# Patient Record
Sex: Male | Born: 1955 | Race: Black or African American | Hispanic: No | Smoking: Current every day smoker
Health system: Southern US, Community
[De-identification: ages and names within clinical notes are randomized; demographics above are authoritative.]

## PROBLEM LIST (undated history)

## (undated) DIAGNOSIS — I10 Essential (primary) hypertension: Secondary | ICD-10-CM

## (undated) DIAGNOSIS — G9050 Complex regional pain syndrome I, unspecified: Secondary | ICD-10-CM

## (undated) DIAGNOSIS — F32A Depression, unspecified: Secondary | ICD-10-CM

## (undated) DIAGNOSIS — F329 Major depressive disorder, single episode, unspecified: Secondary | ICD-10-CM

## (undated) DIAGNOSIS — F1010 Alcohol abuse, uncomplicated: Secondary | ICD-10-CM

## (undated) HISTORY — PX: EYE SURGERY: SHX253

## (undated) HISTORY — PX: HERNIA REPAIR: SHX51

## (undated) HISTORY — PX: OTHER SURGICAL HISTORY: SHX169

---

## 2002-03-21 DIAGNOSIS — I10 Essential (primary) hypertension: Secondary | ICD-10-CM | POA: Insufficient documentation

## 2010-05-02 DIAGNOSIS — G9050 Complex regional pain syndrome I, unspecified: Secondary | ICD-10-CM | POA: Insufficient documentation

## 2011-11-28 ENCOUNTER — Emergency Department (HOSPITAL_COMMUNITY)
Admission: EM | Admit: 2011-11-28 | Discharge: 2011-11-29 | Disposition: A | Discharge status: Home / Self Care | Payer: Medicare (Managed Care) | Attending: Emergency Medicine | Admitting: Emergency Medicine

## 2011-11-28 ENCOUNTER — Encounter (HOSPITAL_COMMUNITY): Payer: Self-pay | Admitting: *Deleted

## 2011-11-28 DIAGNOSIS — I10 Essential (primary) hypertension: Secondary | ICD-10-CM | POA: Insufficient documentation

## 2011-11-28 DIAGNOSIS — F1010 Alcohol abuse, uncomplicated: Secondary | ICD-10-CM

## 2011-11-28 DIAGNOSIS — F1410 Cocaine abuse, uncomplicated: Secondary | ICD-10-CM | POA: Insufficient documentation

## 2011-11-28 DIAGNOSIS — E876 Hypokalemia: Secondary | ICD-10-CM

## 2011-11-28 HISTORY — DX: Essential (primary) hypertension: I10

## 2011-11-28 LAB — CBC
HCT: 45.1 % (ref 39.0–52.0)
Hemoglobin: 16.3 g/dL (ref 13.0–17.0)
MCV: 92.4 fL (ref 78.0–100.0)
Platelets: 230 10*3/uL (ref 150–400)
RDW: 13.5 % (ref 11.5–15.5)
WBC: 9.4 10*3/uL (ref 4.0–10.5)

## 2011-11-28 LAB — COMPREHENSIVE METABOLIC PANEL
ALT: 7 U/L (ref 0–53)
AST: 11 U/L (ref 0–37)
Albumin: 4 g/dL (ref 3.5–5.2)
CO2: 25 mEq/L (ref 19–32)
Calcium: 8.9 mg/dL (ref 8.4–10.5)
Sodium: 140 mEq/L (ref 135–145)
Total Protein: 7.7 g/dL (ref 6.0–8.3)

## 2011-11-28 LAB — RAPID URINE DRUG SCREEN, HOSP PERFORMED
Amphetamines: NOT DETECTED
Barbiturates: NOT DETECTED
Benzodiazepines: NOT DETECTED
Cocaine: POSITIVE — AB
Opiates: NOT DETECTED
Tetrahydrocannabinol: NOT DETECTED

## 2011-11-28 MED ORDER — ZOLPIDEM TARTRATE 5 MG PO TABS: 5.0000 mg | ORAL_TABLET | Freq: Every evening | ORAL | Status: DC | PRN

## 2011-11-28 MED ORDER — ONDANSETRON HCL 8 MG PO TABS: 4.0000 mg | ORAL_TABLET | Freq: Three times a day (TID) | ORAL | Status: DC | PRN

## 2011-11-28 MED ORDER — BUPROPION HCL 75 MG PO TABS: 75.0000 mg | ORAL_TABLET | Freq: Two times a day (BID) | ORAL | Status: DC

## 2011-11-28 MED ORDER — LISINOPRIL 20 MG PO TABS
20.0000 mg | ORAL_TABLET | Freq: Every day | ORAL | Status: DC
  Administered 2011-11-28: 20 mg via ORAL
  Filled 2011-11-28: qty 1

## 2011-11-28 MED ORDER — POTASSIUM CHLORIDE CRYS ER 20 MEQ PO TBCR
40.0000 meq | EXTENDED_RELEASE_TABLET | Freq: Once | ORAL | Status: AC
  Administered 2011-11-28: 40 meq via ORAL
  Filled 2011-11-28: qty 2

## 2011-11-28 MED ORDER — ARIPIPRAZOLE 10 MG PO TABS
10.0000 mg | ORAL_TABLET | Freq: Every day | ORAL | Status: DC
  Administered 2011-11-28: 10 mg via ORAL
  Filled 2011-11-28 (×2): qty 1

## 2011-11-28 MED ORDER — NICOTINE 14 MG/24HR TD PT24
14.0000 mg | MEDICATED_PATCH | Freq: Every day | TRANSDERMAL | Status: DC
  Administered 2011-11-28: 14 mg via TRANSDERMAL
  Filled 2011-11-28 (×2): qty 1

## 2011-11-28 MED ORDER — ALUM & MAG HYDROXIDE-SIMETH 200-200-20 MG/5ML PO SUSP: 30.0000 mL | ORAL | Status: DC | PRN

## 2011-11-28 MED ORDER — LORAZEPAM 1 MG PO TABS: 1.0000 mg | ORAL_TABLET | Freq: Three times a day (TID) | ORAL | Status: DC | PRN

## 2011-11-28 MED ORDER — ACETAMINOPHEN 325 MG PO TABS: 650.0000 mg | ORAL_TABLET | ORAL | Status: DC | PRN

## 2011-11-28 MED ORDER — IBUPROFEN 400 MG PO TABS: 600.0000 mg | ORAL_TABLET | Freq: Three times a day (TID) | ORAL | Status: DC | PRN

## 2011-11-28 NOTE — ED Notes (Signed)
Patient was taken for shower.  Will return to RM 10 while trying to find placement.

## 2011-11-28 NOTE — ED Notes (Signed)
Patient currently resting quietly in bed; no respiratory or acute distress noted.  Patient denies any needs at this time; will continue to monitor. 

## 2011-11-28 NOTE — ED Provider Notes (Signed)
History  This chart was scribed for Dione Booze, MD by Bennett Scrape. This patient was seen in room TR10C/TR10C and the patient's care was started at 3:01PM.  CSN: [id number]  Arrival date & time 11/28/11  1339   First MD Initiated Contact with Patient 11/28/11 1501      Chief Complaint  Patient presents with  . Medical Clearance    The history is provided by the patient. No language interpreter was used.    Timothy Mcmillan is a 56 y.o. male who presents to the Emergency Department requesting EtOH detox, because he is ready to seek help. He denies having other reasons currently. He reports that he consumes 3 to 4 40 oz alcoholic drinks daily and smokes crack when he can afford it. He states that the last time that he consumed alcohol was 2 to 3 hours ago and the last time that he smoked crack was 4 to 5 hours ago. He denies any other illegal drug use. He reports that he has been through detox before but denies having EtOH withdrawal seizures. He states that he has been mildly depressed but denies SI or HI. He denies having hallucinations, CP or abdominal pain as associated symptoms. He has a h/o HTN.    Past Medical History  Diagnosis Date  . Hypertension     History reviewed. No pertinent past surgical history.  History reviewed. No pertinent family history.  History  Substance Use Topics  . Smoking status: Not on file  . Smokeless tobacco: Not on file  . Alcohol Use: Yes      Review of Systems  Cardiovascular: Negative for chest pain.  Gastrointestinal: Negative for abdominal distention.  Neurological: Negative for seizures.  Psychiatric/Behavioral: Negative for suicidal ideas and hallucinations.  All other systems reviewed and are negative.    Allergies  Review of patient's allergies indicates no known allergies.  Home Medications   Current Outpatient Rx  Name Route Sig Dispense Refill  . ABILIFY PO Oral Take 1 tablet by mouth daily.    . WELLBUTRIN  PO Oral Take by mouth.    Marland Kitchen LISINOPRIL PO Oral Take 1 tablet by mouth daily.      Triage Vitals: BP 141/88  Pulse 70  Temp 98.4 F (36.9 C) (Oral)  Resp 20  SpO2 95%  Physical Exam  Nursing note and vitals reviewed. Constitutional: He is oriented to person, place, and time. He appears well-developed and well-nourished. No distress.  HENT:  Head: Normocephalic and atraumatic.  Eyes: Conjunctivae normal and EOM are normal.  Neck: Neck supple. No tracheal deviation present.  Cardiovascular: Normal rate and regular rhythm.   Pulmonary/Chest: Effort normal and breath sounds normal. No respiratory distress.  Abdominal: Soft. There is no tenderness.  Musculoskeletal: Normal range of motion.  Neurological: He is alert and oriented to person, place, and time.  Skin: Skin is warm and dry.  Psychiatric: He has a normal mood and affect. His behavior is normal.    ED Course  Procedures (including critical care time)  DIAGNOSTIC STUDIES: Oxygen Saturation is 95% on room air, normal by my interpretation.    COORDINATION OF CARE: 3:07PM-Discussed treatment plan which includes an evaluation by ACT with pt at bedside and pt agreed to plan.  3:17PM-Consulted with ACT.  Results for orders placed during the hospital encounter of 11/28/11  CBC      Component Value Range   WBC 9.4  4.0 - 10.5 K/uL   RBC 4.88  4.22 - 5.81 MIL/uL  Hemoglobin 16.3  13.0 - 17.0 g/dL   HCT 16.1  09.6 - 04.5 %   MCV 92.4  78.0 - 100.0 fL   MCH 33.4  26.0 - 34.0 pg   MCHC 36.1 (*) 30.0 - 36.0 g/dL   RDW 40.9  81.1 - 91.4 %   Platelets 230  150 - 400 K/uL  COMPREHENSIVE METABOLIC PANEL      Component Value Range   Sodium 140  135 - 145 mEq/L   Potassium 3.4 (*) 3.5 - 5.1 mEq/L   Chloride 106  96 - 112 mEq/L   CO2 25  19 - 32 mEq/L   Glucose, Bld 95  70 - 99 mg/dL   BUN 14  6 - 23 mg/dL   Creatinine, Ser 7.82  0.50 - 1.35 mg/dL   Calcium 8.9  8.4 - 95.6 mg/dL   Total Protein 7.7  6.0 - 8.3 g/dL    Albumin 4.0  3.5 - 5.2 g/dL   AST 11  0 - 37 U/L   ALT 7  0 - 53 U/L   Alkaline Phosphatase 70  39 - 117 U/L   Total Bilirubin 0.3  0.3 - 1.2 mg/dL   GFR calc non Af Amer 74 (*) >90 mL/min   GFR calc Af Amer 85 (*) >90 mL/min  ETHANOL      Component Value Range   Alcohol, Ethyl (B) <11  0 - 11 mg/dL  URINE RAPID DRUG SCREEN (HOSP PERFORMED)      Component Value Range   Opiates NONE DETECTED  NONE DETECTED   Cocaine POSITIVE (*) NONE DETECTED   Benzodiazepines NONE DETECTED  NONE DETECTED   Amphetamines NONE DETECTED  NONE DETECTED   Tetrahydrocannabinol NONE DETECTED  NONE DETECTED   Barbiturates NONE DETECTED  NONE DETECTED    1. Alcohol abuse   2. Cocaine abuse   3. Hypokalemia       MDM  Alcohol and cocoaine abuse. ACT Team with be consulted to assist in placement.  Potassium has come back slightly low, and he is given a dose of oral potassium. ACT Team states he does not meet criteria for inpatient detox. He is referred for outpatient treatment.   I personally performed the services described in this documentation, which was scribed in my presence. The recorded information has been reviewed and considered.         Dione Booze, MD 11/28/11 [phone]

## 2011-11-28 NOTE — ED Notes (Addendum)
Pt was given list of homeless shelters in the surrounding area and assisted in calling the shelters. Social worker called to ask for some assistance, she gave a few shelters that she thought would be open at this time and day.

## 2011-11-28 NOTE — ED Notes (Signed)
Requesting detox from ETOH and crack.  Last use 1 hour PTA

## 2011-11-28 NOTE — ED Notes (Signed)
Pt reports he was recently kicked out of his homeless shelter. Pt requesting information on homeless shelter in [place].

## 2011-11-28 NOTE — BH Assessment (Signed)
Assessment Note   Timothy Mcmillan is an 56 y.o. male. Pt requests detox, reports he is drinking 3 40 oz beers daily and using crack cocaine.  Pt does not report withdrawal symptoms and vitals are not elevated.  BAC <11.  Pt reports he is homeless and somewhat depressed but does not endorse depressive symptoms except tearfulness.  Pt denies SI/HI/AV.  Pt very drowsy throughout.  Axis I: Alcohol Abuse Axis II: Deferred Axis III:  Past Medical History  Diagnosis Date  . Hypertension    Axis IV: housing problems Axis V: 51-60 moderate symptoms  Past Medical History:  Past Medical History  Diagnosis Date  . Hypertension     History reviewed. No pertinent past surgical history.  Family History: History reviewed. No pertinent family history.  Social History:  does not have a smoking history on file. He does not have any smokeless tobacco history on file. He reports that he drinks alcohol. He reports that he uses illicit drugs.  Additional Social History:  Alcohol / Drug Use Pain Medications: Pt denies Prescriptions: Pt denies Over the Counter: Pt denies History of alcohol / drug use?: Yes Negative Consequences of Use: Financial;Legal;Personal relationships;Work / Mining engineer #1 Name of Substance 1: alcohol 1 - Age of First Use: 20s 1 - Amount (size/oz): 3 40 oz beers 1 - Frequency: daily 1 - Duration: 1 month 1 - Last Use / Amount: 10/19, 2 quarts beer Substance #2 Name of Substance 2: crack 2 - Age of First Use: 45s 2 - Amount (size/oz): $40 2 - Frequency: daily 2 - Duration: 2 months 2 - Last Use / Amount: 10/19 $40  CIWA: CIWA-Ar BP: 130/79 mmHg Pulse Rate: 66  Nausea and Vomiting: no nausea and no vomiting Tactile Disturbances: moderate itching, pins and needles, burning or numbness Tremor: no tremor Auditory Disturbances: not present Paroxysmal Sweats: barely perceptible sweating, palms moist Visual Disturbances: not present Anxiety: no anxiety, at  ease Headache, Fullness in Head: none present Agitation: normal activity Orientation and Clouding of Sensorium: oriented and can do serial additions CIWA-Ar Total: 4  COWS:    Allergies: No Known Allergies  Home Medications:  (Not in a hospital admission)  OB/GYN Status:  No LMP for male patient.  General Assessment Data Location of Assessment: [facility] ED ACT Assessment: Yes Living Arrangements: Other (Comment) (homeless) Can pt return to current living arrangement?: Yes     Risk to self Suicidal Ideation: No Suicidal Intent: No Is patient at risk for suicide?: No Suicidal Plan?: No Access to Means: No What has been your use of drugs/alcohol within the last 12 months?: current use of alcohol, crack Previous Attempts/Gestures: Yes How many times?: 3  Triggers for Past Attempts: Other (Comment) (depression) Intentional Self Injurious Behavior: Cutting Comment - Self Injurious Behavior: in past Family Suicide History: No Recent stressful life event(s): Other (Comment);Conflict (Comment) (homless, relationships) Persecutory voices/beliefs?: No Depression: Yes Depression Symptoms: Tearfulness Substance abuse history and/or treatment for substance abuse?: Yes Suicide prevention information given to non-admitted patients: Yes  Risk to Others Homicidal Ideation: No Thoughts of Harm to Others: No Current Homicidal Intent: No Current Homicidal Plan: No Access to Homicidal Means: No History of harm to others?: No Assessment of Violence: None Noted Does patient have access to weapons?: No Criminal Charges Pending?: No Does patient have a court date: No  Psychosis Hallucinations: None noted Delusions: None noted  Mental Status Report Appear/Hygiene: Disheveled;Body odor Eye Contact: Poor Motor Activity: Unremarkable Speech: Logical/coherent Level of Consciousness: Drowsy  Mood: Other (Comment) (cooperative) Affect: Appropriate to circumstance Anxiety Level:  None Thought Processes: Relevant Judgement: Unimpaired Orientation: Person;Place;Time;Situation Obsessive Compulsive Thoughts/Behaviors: None  Cognitive Functioning Concentration: Normal Memory: Recent Intact;Remote Intact IQ: Average Insight: Fair Impulse Control: Poor Appetite: Good Weight Loss: 0  Weight Gain: 0  Sleep: No Change Total Hours of Sleep: 5  Vegetative Symptoms: None  ADLScreening [facility]) Patient's cognitive ability adequate to safely complete daily activities?: Yes Patient able to express need for assistance with ADLs?: Yes Independently performs ADLs?: Yes (appropriate for developmental age)  Abuse/Neglect [facility]) Physical Abuse: Denies Verbal Abuse: Denies Sexual Abuse: Denies  Prior Inpatient Therapy Prior Inpatient Therapy: Yes Prior Therapy Dates: 2010 Prior Therapy Facilty/Provider(s): [facility] Reason for Treatment: alcohol  Prior Outpatient Therapy Prior Outpatient Therapy: No  ADL Screening (condition at time of admission) Patient's cognitive ability adequate to safely complete daily activities?: Yes Patient able to express need for assistance with ADLs?: Yes Independently performs ADLs?: Yes (appropriate for developmental age) Weakness of Legs: None Weakness of Arms/Hands: None  Home Assistive Devices/Equipment Home Assistive Devices/Equipment: None    Abuse/Neglect Assessment (Assessment to be complete while patient is alone) Physical Abuse: Denies Verbal Abuse: Denies Sexual Abuse: Denies Exploitation of patient/patient's resources: Denies Self-Neglect: Denies Values / Beliefs Cultural Requests During Hospitalization: None Spiritual Requests During Hospitalization: None   Advance Directives (For Healthcare) Advance Directive: Patient does not have advance directive;Patient would not like information    Additional Information 1:1 In Past 12 Months?: No CIRT Risk: No Elopement Risk: No Does  patient have medical clearance?: Yes     Disposition: Discussed pt with Dr Preston Fleeting.  Pt does not report withdrawals or have elevated vital signs, does not meet criteria for detox.  Discussed with pt treatment options, outpt and inpt, gave contact info.  Pt also states he is homeless but says he was kicked out of [facility].  RN will get info for [facility] shelter as pt reports he may be able to get ride to [employer]. Disposition Disposition of Patient: Other dispositions Other disposition(s): Referred to outside facility (substance abuse providers)  On Site Evaluation by:   Reviewed with Physician:     Lorri Frederick 11/28/2011 5:52 PM

## 2011-11-28 NOTE — ED Notes (Signed)
Warm blankets given to patient-resting at present

## 2011-11-28 NOTE — ED Notes (Signed)
Pt reports he is here to detox from ETOH and crack. Pt reports he drinks about 6 40oz beers a day. Pt reports last drink was today and he drank 2 40 oz. beers. Pt reports he wasn't under the impression that he was going to have to stay here and he drove his friends car but needs to take it back so he won't be able to stay here. Pt denies SI & HI.

## 2011-11-28 NOTE — ED Notes (Signed)
Pt unable to find a shelter to take him for the night.

## 2011-11-28 NOTE — ED Notes (Signed)
Pt placed in blue scrubs. Security paged to wand pt.

## 2011-11-29 NOTE — ED Notes (Signed)
Received bedside report from [place], [place].  Patient currently asleep in bed; no respiratory or acute distress noted.  Patient sleeping in ED until morning.  Patient has already received discharge paperwork and signed disposition while in fast track.  Waiting until 0700 for official discharge, per charge RN. Will continue to monitor.

## 2011-11-29 NOTE — ED Notes (Addendum)
Patient woken up and escorted out to discharge desk by charge RN.

## 2011-11-29 NOTE — ED Notes (Signed)
Patient currently asleep in bed; no respiratory or acute distress.  Will continue to monitor.

## 2011-11-29 NOTE — ED Notes (Signed)
Patient resting quietly in bed; no respiratory or acute distress.  Respirations even and unlabored; patient lightly snoring.  Will continue to monitor.

## 2011-11-29 NOTE — ED Notes (Signed)
Patient asleep will continue to monitor.

## 2014-11-14 ENCOUNTER — Inpatient Hospital Stay (HOSPITAL_COMMUNITY)
Admission: EM | Admit: 2014-11-14 | Discharge: 2014-11-17 | DRG: 193 | Disposition: A | Discharge status: Home / Self Care | Payer: Medicare Other | Attending: Internal Medicine | Admitting: Internal Medicine

## 2014-11-14 ENCOUNTER — Encounter (HOSPITAL_COMMUNITY): Payer: Self-pay | Admitting: *Deleted

## 2014-11-14 ENCOUNTER — Inpatient Hospital Stay (HOSPITAL_COMMUNITY): Payer: Medicare Other

## 2014-11-14 ENCOUNTER — Emergency Department (HOSPITAL_COMMUNITY): Payer: Medicare Other

## 2014-11-14 DIAGNOSIS — I10 Essential (primary) hypertension: Secondary | ICD-10-CM | POA: Diagnosis present

## 2014-11-14 DIAGNOSIS — F17200 Nicotine dependence, unspecified, uncomplicated: Secondary | ICD-10-CM | POA: Diagnosis present

## 2014-11-14 DIAGNOSIS — F10230 Alcohol dependence with withdrawal, uncomplicated: Secondary | ICD-10-CM

## 2014-11-14 DIAGNOSIS — N179 Acute kidney failure, unspecified: Secondary | ICD-10-CM | POA: Diagnosis present

## 2014-11-14 DIAGNOSIS — F1010 Alcohol abuse, uncomplicated: Secondary | ICD-10-CM | POA: Diagnosis present

## 2014-11-14 DIAGNOSIS — R0600 Dyspnea, unspecified: Secondary | ICD-10-CM | POA: Diagnosis not present

## 2014-11-14 DIAGNOSIS — G9341 Metabolic encephalopathy: Secondary | ICD-10-CM | POA: Diagnosis present

## 2014-11-14 DIAGNOSIS — G9340 Encephalopathy, unspecified: Secondary | ICD-10-CM | POA: Diagnosis not present

## 2014-11-14 DIAGNOSIS — J189 Pneumonia, unspecified organism: Secondary | ICD-10-CM | POA: Diagnosis present

## 2014-11-14 DIAGNOSIS — F10939 Alcohol use, unspecified with withdrawal, unspecified: Secondary | ICD-10-CM | POA: Diagnosis present

## 2014-11-14 DIAGNOSIS — F10239 Alcohol dependence with withdrawal, unspecified: Secondary | ICD-10-CM | POA: Diagnosis present

## 2014-11-14 DIAGNOSIS — Z79899 Other long term (current) drug therapy: Secondary | ICD-10-CM | POA: Diagnosis not present

## 2014-11-14 DIAGNOSIS — E876 Hypokalemia: Secondary | ICD-10-CM | POA: Diagnosis present

## 2014-11-14 DIAGNOSIS — F10231 Alcohol dependence with withdrawal delirium: Secondary | ICD-10-CM | POA: Diagnosis not present

## 2014-11-14 DIAGNOSIS — F1910 Other psychoactive substance abuse, uncomplicated: Secondary | ICD-10-CM | POA: Diagnosis present

## 2014-11-14 DIAGNOSIS — N400 Enlarged prostate without lower urinary tract symptoms: Secondary | ICD-10-CM | POA: Diagnosis present

## 2014-11-14 DIAGNOSIS — F1410 Cocaine abuse, uncomplicated: Secondary | ICD-10-CM | POA: Diagnosis present

## 2014-11-14 DIAGNOSIS — F10931 Alcohol use, unspecified with withdrawal delirium: Secondary | ICD-10-CM | POA: Diagnosis present

## 2014-11-14 DIAGNOSIS — Z590 Homelessness: Secondary | ICD-10-CM | POA: Diagnosis not present

## 2014-11-14 DIAGNOSIS — F10930 Alcohol use, unspecified with withdrawal, uncomplicated: Secondary | ICD-10-CM

## 2014-11-14 DIAGNOSIS — R5383 Other fatigue: Secondary | ICD-10-CM

## 2014-11-14 DIAGNOSIS — R930 Abnormal findings on diagnostic imaging of skull and head, not elsewhere classified: Secondary | ICD-10-CM

## 2014-11-14 LAB — RAPID URINE DRUG SCREEN, HOSP PERFORMED
AMPHETAMINES: NOT DETECTED
BENZODIAZEPINES: NOT DETECTED
Barbiturates: NOT DETECTED
COCAINE: POSITIVE — AB
Opiates: NOT DETECTED
Tetrahydrocannabinol: NOT DETECTED

## 2014-11-14 LAB — COMPREHENSIVE METABOLIC PANEL
ALBUMIN: 3.6 g/dL (ref 3.5–5.0)
ALT: 12 U/L — ABNORMAL LOW (ref 17–63)
ANION GAP: 10 (ref 5–15)
AST: 29 U/L (ref 15–41)
Alkaline Phosphatase: 77 U/L (ref 38–126)
BUN: 7 mg/dL (ref 6–20)
CHLORIDE: 101 mmol/L (ref 101–111)
CO2: 28 mmol/L (ref 22–32)
Calcium: 8.4 mg/dL — ABNORMAL LOW (ref 8.9–10.3)
Creatinine, Ser: 1.35 mg/dL — ABNORMAL HIGH (ref 0.61–1.24)
GFR calc Af Amer: >60 mL/min (ref >60)
GFR, EST NON AFRICAN AMERICAN: 56 mL/min — AB (ref >60)
Glucose, Bld: 136 mg/dL — ABNORMAL HIGH (ref 65–99)
POTASSIUM: 2.8 mmol/L — AB (ref 3.5–5.1)
Sodium: 139 mmol/L (ref 135–145)
Total Bilirubin: 0.5 mg/dL (ref 0.3–1.2)
Total Protein: 6.5 g/dL (ref 6.5–8.1)

## 2014-11-14 LAB — CBC WITH DIFFERENTIAL/PLATELET
BASOS ABS: 0 10*3/uL (ref 0.0–0.1)
BASOS PCT: 0 %
EOS PCT: 1 %
Eosinophils Absolute: 0.1 10*3/uL (ref 0.0–0.7)
HCT: 45.3 % (ref 39.0–52.0)
Hemoglobin: 15.4 g/dL (ref 13.0–17.0)
Lymphocytes Relative: 16 %
Lymphs Abs: 1.5 10*3/uL (ref 0.7–4.0)
MCH: 32.6 pg (ref 26.0–34.0)
MCHC: 34 g/dL (ref 30.0–36.0)
MCV: 96 fL (ref 78.0–100.0)
MONO ABS: 0.6 10*3/uL (ref 0.1–1.0)
Monocytes Relative: 7 %
Neutro Abs: 7.2 10*3/uL (ref 1.7–7.7)
Neutrophils Relative %: 76 %
PLATELETS: 229 10*3/uL (ref 150–400)
RBC: 4.72 MIL/uL (ref 4.22–5.81)
RDW: 14.2 % (ref 11.5–15.5)
WBC: 9.5 10*3/uL (ref 4.0–10.5)

## 2014-11-14 LAB — I-STAT VENOUS BLOOD GAS, ED
Bicarbonate: 24.7 mEq/L — ABNORMAL HIGH (ref 20.0–24.0)
O2 SAT: 91 %
TCO2: 26 mmol/L (ref 0–100)
pCO2, Ven: 40.9 mmHg — ABNORMAL LOW (ref 45.0–50.0)
pH, Ven: 7.388 — ABNORMAL HIGH (ref 7.250–7.300)
pO2, Ven: 61 mmHg — ABNORMAL HIGH (ref 30.0–45.0)

## 2014-11-14 LAB — ETHANOL

## 2014-11-14 LAB — LACTIC ACID, PLASMA: Lactic Acid, Venous: 1.3 mmol/L (ref 0.5–2.0)

## 2014-11-14 MED ORDER — NALOXONE HCL 0.4 MG/ML IJ SOLN
0.4000 mg | INTRAMUSCULAR | Status: DC | PRN
  Administered 2014-11-14: 0.4 mg via INTRAVENOUS
  Filled 2014-11-14: qty 1

## 2014-11-14 MED ORDER — LORAZEPAM 2 MG/ML IJ SOLN
1.0000 mg | Freq: Once | INTRAMUSCULAR | Status: AC
  Administered 2014-11-14: 1 mg via INTRAVENOUS
  Filled 2014-11-14: qty 1

## 2014-11-14 MED ORDER — LORAZEPAM 1 MG PO TABS: 0.0000 mg | ORAL_TABLET | Freq: Two times a day (BID) | ORAL | Status: DC

## 2014-11-14 MED ORDER — THIAMINE HCL 100 MG/ML IJ SOLN
100.0000 mg | Freq: Every day | INTRAMUSCULAR | Status: DC
  Administered 2014-11-15: 100 mg via INTRAVENOUS
  Filled 2014-11-14 (×4): qty 1

## 2014-11-14 MED ORDER — SODIUM CHLORIDE 0.9 % IV SOLN
INTRAVENOUS | Status: DC
  Administered 2014-11-14 – 2014-11-16 (×4): via INTRAVENOUS

## 2014-11-14 MED ORDER — DEXTROSE 5 % IV SOLN
1.0000 g | INTRAVENOUS | Status: DC
  Administered 2014-11-15 (×2): 1 g via INTRAVENOUS
  Filled 2014-11-14 (×3): qty 10

## 2014-11-14 MED ORDER — LORAZEPAM 2 MG/ML IJ SOLN: 0.0000 mg | Freq: Two times a day (BID) | INTRAMUSCULAR | Status: DC

## 2014-11-14 MED ORDER — VITAMIN B-1 100 MG PO TABS
100.0000 mg | ORAL_TABLET | Freq: Every day | ORAL | Status: DC
  Administered 2014-11-15 – 2014-11-17 (×3): 100 mg via ORAL
  Filled 2014-11-14 (×3): qty 1

## 2014-11-14 MED ORDER — LORAZEPAM 2 MG/ML IJ SOLN
2.0000 mg | INTRAMUSCULAR | Status: DC | PRN
Start: 2014-11-14 — End: 2014-11-17

## 2014-11-14 MED ORDER — DEXTROSE 5 % IV SOLN
500.0000 mg | INTRAVENOUS | Status: DC
  Administered 2014-11-15: 500 mg via INTRAVENOUS
  Filled 2014-11-14 (×2): qty 500

## 2014-11-14 MED ORDER — LORAZEPAM 1 MG PO TABS: 0.0000 mg | ORAL_TABLET | Freq: Four times a day (QID) | ORAL | Status: DC

## 2014-11-14 MED ORDER — LORAZEPAM 2 MG/ML IJ SOLN: 0.0000 mg | Freq: Four times a day (QID) | INTRAMUSCULAR | Status: DC

## 2014-11-14 MED ORDER — POTASSIUM CHLORIDE 10 MEQ/100ML IV SOLN
10.0000 meq | Freq: Once | INTRAVENOUS | Status: AC
  Administered 2014-11-14: 10 meq via INTRAVENOUS
  Filled 2014-11-14: qty 100

## 2014-11-14 MED ORDER — POTASSIUM CHLORIDE CRYS ER 20 MEQ PO TBCR
40.0000 meq | EXTENDED_RELEASE_TABLET | Freq: Once | ORAL | Status: AC
  Administered 2014-11-14: 40 meq via ORAL
  Filled 2014-11-14: qty 2

## 2014-11-14 MED ORDER — POTASSIUM CHLORIDE 20 MEQ/15ML (10%) PO SOLN
10.0000 meq | Freq: Once | ORAL | Status: DC
  Filled 2014-11-14: qty 15

## 2014-11-14 MED ORDER — DEXTROSE 5 % IV SOLN
500.0000 mg | Freq: Once | INTRAVENOUS | Status: AC
  Administered 2014-11-14: 500 mg via INTRAVENOUS
  Filled 2014-11-14: qty 500

## 2014-11-14 NOTE — ED Notes (Signed)
Pt is here with medical clearance [facility]  Pt wants to get off of crack and etoh.  Last crack use 2 days ago and last etoh was 2 days.  Usually does not go throught withdrawal of etoh.  Pt states he intermittently drinks.  No thoughts of SI.

## 2014-11-14 NOTE — ED Notes (Signed)
PT refuses to answer questions for CIWA score ,But opens eyes and jerks arm while NT attempting to draw Lab work.

## 2014-11-14 NOTE — ED Notes (Signed)
Pt acting erractic upon reassessment. Pt continues pulling wires and asking what things are. Pt acts confused, but denies hallucinations. Tiffany, PA informed.

## 2014-11-14 NOTE — H&P (Addendum)
PCP:  Default, Provider, MD    Referring provider Tiffany   Chief Complaint:  Here for medical clearance  HPI: Timothy Mcmillan is a 59 y.o. male   has a past medical history of Hypertension.   Presented with a she was interested in residential treatment for drug abuse. She presented to emergency department for medical clearance. Reports alcohol abuse as well as crack cocaine abuse last time 2 days ago Patient denies prior history of alcohol withdrawals. States he drinks on intermittently. Patient denied any suicidal ideation. Patient reports experiencing visual and auditory hallucinations. He uses crack cocaine. In emergency department he was found to have potassium down to 2.8 with EKG changes.. Chest x-ray was done was worrisome for multifocal pneumonia versus atelectasis. Hospitalist was called for admission for community-acquired pneumonia and hypokalemia in the setting of polysubstance abuse  Review of Systems:    Pertinent positives include:   Constitutional:  No weight loss, night sweats, Fevers, chills, fatigue, weight loss  HEENT:  No headaches, Difficulty swallowing,Tooth/dental problems,Sore throat,  No sneezing, itching, ear ache, nasal congestion, post nasal drip,  Cardio-vascular:  No chest pain, Orthopnea, PND, anasarca, dizziness, palpitations.no Bilateral lower extremity swelling  GI:  No heartburn, indigestion, abdominal pain, nausea, vomiting, diarrhea, change in bowel habits, loss of appetite, melena, blood in stool, hematemesis Resp:  no shortness of breath at rest. No dyspnea on exertion, No excess mucus, no productive cough, No non-productive cough, No coughing up of blood.No change in color of mucus.No wheezing. Skin:  no rash or lesions. No jaundice GU:  no dysuria, change in color of urine, no urgency or frequency. No straining to urinate.  No flank pain.  Musculoskeletal:  No joint pain or no joint swelling. No decreased range of motion. No  back pain.  Psych:  No change in mood or affect. No depression or anxiety. No memory loss.  Neuro: no localizing neurological complaints, no tingling, no weakness, no double vision, no gait abnormality, no slurred speech, no confusion  Otherwise ROS are negative except for above, 10 systems were reviewed  Past Medical History: Past Medical History  Diagnosis Date  . Hypertension    Past Surgical History  Procedure Laterality Date  . Eye surgery    . Hernia repair       Medications: Prior to Admission medications   Medication Sig Start Date End Date Taking? Authorizing Provider  ARIPiprazole (ABILIFY PO) Take 1 tablet by mouth daily.    Historical Provider, MD  BuPROPion HCl (WELLBUTRIN PO) Take by mouth.    Historical Provider, MD  LISINOPRIL PO Take 1 tablet by mouth daily.    Historical Provider, MD    Allergies:  No Known Allergies  Social History:  Ambulatory   independently  Lives at home  With family     reports that he has been smoking.  He does not have any smokeless tobacco history on file. He reports that he drinks alcohol. He reports that he uses illicit drugs (Cocaine).    Family History: family history includes Diabetes type II in his father; Stroke in his father.    Physical Exam: Patient Vitals for the past 24 hrs:  BP Temp Temp src Pulse Resp SpO2  11/14/14 1915 140/87 mmHg - - 60 - 96 %  11/14/14 1900 142/87 mmHg - - 63 - 94 %  11/14/14 1845 140/84 mmHg - - 63 - 96 %  11/14/14 1830 152/94 mmHg - - 62 - 96 %  11/14/14 1815 145/91  mmHg - - 64 - 97 %  11/14/14 1800 152/93 mmHg - - 64 - 99 %  11/14/14 1730 150/100 mmHg - - 67 - 100 %  11/14/14 1700 164/98 mmHg - - 63 21 100 %  11/14/14 1645 149/94 mmHg - - 65 18 95 %  11/14/14 1600 135/75 mmHg - - 62 18 97 %  11/14/14 1545 135/80 mmHg - - (!) 58 18 97 %  11/14/14 1530 139/67 mmHg - - 64 21 96 %  11/14/14 1521 133/77 mmHg - - 65 (!) 8 98 %  11/14/14 1246 157/89 mmHg 97.4 F (36.3 C) Oral 74 18  100 %    1. General:  in No Acute distress 2. Psychological: Somnolent difficult to arouse   Oriented to self 3. Head/ENT:    Dry Mucous Membranes                          Head Non traumatic, neck supple                          Normal   Dentition 4. SKIN:   decreased Skin turgor,  Skin clean Dry and intact no rash 5. Heart: Regular rate and rhythm no Murmur, Rub or gallop 6. Lungs: Clear to auscultation bilaterally, no wheezes or crackles   7. Abdomen: Soft, non-tender, Non distended 8. Lower extremities: no clubbing, cyanosis, or edema 9. Neurologically Grossly intact, moving all 4 extremities equally 10. MSK: Normal range of motion  body mass index is unknown because there is no height or weight on file.   Labs on Admission:   Results for orders placed or performed during the hospital encounter of 11/14/14 (from the past 24 hour(s))  CBC with Differential/Platelet     Status: None   Collection Time: 11/14/14  1:20 PM  Result Value Ref Range   WBC 9.5 4.0 - 10.5 K/uL   RBC 4.72 4.22 - 5.81 MIL/uL   Hemoglobin 15.4 13.0 - 17.0 g/dL   HCT 16.1 09.6 - 04.5 %   MCV 96.0 78.0 - 100.0 fL   MCH 32.6 26.0 - 34.0 pg   MCHC 34.0 30.0 - 36.0 g/dL   RDW 40.9 81.1 - 91.4 %   Platelets 229 150 - 400 K/uL   Neutrophils Relative % 76 %   Neutro Abs 7.2 1.7 - 7.7 K/uL   Lymphocytes Relative 16 %   Lymphs Abs 1.5 0.7 - 4.0 K/uL   Monocytes Relative 7 %   Monocytes Absolute 0.6 0.1 - 1.0 K/uL   Eosinophils Relative 1 %   Eosinophils Absolute 0.1 0.0 - 0.7 K/uL   Basophils Relative 0 %   Basophils Absolute 0.0 0.0 - 0.1 K/uL  Comprehensive metabolic panel     Status: Abnormal   Collection Time: 11/14/14  1:20 PM  Result Value Ref Range   Sodium 139 135 - 145 mmol/L   Potassium 2.8 (L) 3.5 - 5.1 mmol/L   Chloride 101 101 - 111 mmol/L   CO2 28 22 - 32 mmol/L   Glucose, Bld 136 (H) 65 - 99 mg/dL   BUN 7 6 - 20 mg/dL   Creatinine, Ser 7.82 (H) 0.61 - 1.24 mg/dL   Calcium 8.4 (L) 8.9 -  10.3 mg/dL   Total Protein 6.5 6.5 - 8.1 g/dL   Albumin 3.6 3.5 - 5.0 g/dL   AST 29 15 - 41 U/L   ALT 12 (  L) 17 - 63 U/L   Alkaline Phosphatase 77 38 - 126 U/L   Total Bilirubin 0.5 0.3 - 1.2 mg/dL   GFR calc non Af Amer 56 (L) >60 mL/min   GFR calc Af Amer >60 >60 mL/min   Anion gap 10 5 - 15  Ethanol     Status: None   Collection Time: 11/14/14  1:20 PM  Result Value Ref Range   Alcohol, Ethyl (B) <5 <5 mg/dL  Urine rapid drug screen (hosp performed)     Status: Abnormal   Collection Time: 11/14/14  6:04 PM  Result Value Ref Range   Opiates NONE DETECTED NONE DETECTED   Cocaine POSITIVE (A) NONE DETECTED   Benzodiazepines NONE DETECTED NONE DETECTED   Amphetamines NONE DETECTED NONE DETECTED   Tetrahydrocannabinol NONE DETECTED NONE DETECTED   Barbiturates NONE DETECTED NONE DETECTED    UA not obtained  No results found for: HGBA1C  CrCl cannot be calculated (Unknown ideal weight.).  BNP (last 3 results) No results for input(s): PROBNP in the last 8760 hours.  Other results:  I have pearsonaly reviewed this: ECG REPORT  Rate: 62  Rhythm: Normal sinus rhythm ST&T Change: No ischemic changes U wave present QTC 399  There were no vitals filed for this visit.   Cultures: No results found for: SDES, SPECREQUEST, CULT, REPTSTATUS   Radiological Exams on Admission: Dg Chest 2 View  11/14/2014   CLINICAL DATA:  Shortness of breath. Cough. History of hypertension and smoking.  EXAM: CHEST  2 VIEW  COMPARISON:  None.  FINDINGS: Borderline enlarged cardiac silhouette and mediastinal contours, potentially accentuated due decreased lung volumes and kyphotic projection. Bibasilar heterogeneous/consolidative opacities, left greater than right. There is minimal pleural parenchymal thickening about the peripheral aspect the right minor fissure. No definite pleural effusion or pneumothorax. No evidence of edema. No acute osseus abnormalities.  IMPRESSION: Bibasilar  heterogeneous/consolidative opacities, left greater than right, potentially atelectasis given mild hypoventilation though multifocal infection could have a similar appearance. A follow-up chest radiograph in 3 to 4 weeks after treatment is recommended to ensure resolution.   Electronically Signed   By: Simonne Come M.D.   On: 11/14/2014 16:31    Chart has been reviewed  Family not at  Bedside    Assessment/Plan  71 -year-old male with history of polysubstance abuse and alcohol abuse presents for medical clearance was found to be hypokalemic with potassium down to 2.8 and chest x-ray showing evidence of multifocal pneumonia   Present on Admission:  . Acute encephalopathy given significant lethargy we'll monitor and step down obtain CT scan of the head and the VBG . Hypokalemia will replace check magnesium level  . Polysubstance abuse we'll need social work consult  . Alcohol withdrawal delirium (HCC) - CIWA protocol given delirium and hallucinations will admit to step down  . CAP (community acquired pneumonia) we'll obtain blood cultures sputum culture check for HIV status for now cover with azithromycin and Rocephin patient will need follow-up imaging to document clearance   . Acute renal failure (ARF) (HCC) - we'll administer IV fluids check urine electrolytes    Prophylaxis: SCDs for now until CT scan is back  CODE STATUS:  FULL CODE as per patient    Disposition: was supposed to go to [facility]  Other plan as per orders.  I have spent a total of 55 min on this admission  Ikhlas Albo 11/14/2014, 7:40 PM  [facility]  Pager [phone]   after  2 AM please page floor coverage PA If 7AM-7PM, please contact the day team taking care of the patient  [email or web address]  Password [email or web address]

## 2014-11-14 NOTE — ED Notes (Signed)
PT continues to sleep but wakes to verbal stimuli.

## 2014-11-14 NOTE — ED Notes (Signed)
Changed patient acuity per Tran, PA-C 

## 2014-11-14 NOTE — ED Notes (Signed)
PT moved to POD E due to low  K+. PO replacement given.

## 2014-11-14 NOTE — ED Provider Notes (Signed)
CSN: [id number]     Arrival date & time 11/14/14  1234 History  By signing my name below, I, Lyndel Safe, attest that this documentation has been prepared under the direction and in the presence of Fayrene Helper, PA-C.  Electronically Signed: Lyndel Safe, ED Scribe. 11/14/2014. 1:17 PM.   Chief Complaint  Patient presents with  . Addiction Problem    The history is provided by the patient. No language interpreter was used.   HPI Comments: Timothy Mcmillan is a 59 y.o. male, with a PMhx of polysubstance abuse, who presents to the Emergency Department from [facility] for request of medical clearance. The pt reports he would like to detox from crack cocaine and EtOH due to experiencing increased paranoia with both auditory and visual hallucinations that are present with use of crack. Pt associates decreased appetite and sleep secondary to polysubstance abuse. Last use of both crack cocaine and EtOH was 2 days ago. He has sought treatment for abuse of these same substances in the past and notes he relapsed 4 months ago. Pt denies SI or HI or any pain.   Past Medical History  Diagnosis Date  . Hypertension    Past Surgical History  Procedure Laterality Date  . Eye surgery    . Hernia repair     No family history on file. Social History  Substance Use Topics  . Smoking status: Current Every Day Smoker  . Smokeless tobacco: None  . Alcohol Use: Yes     Comment: intermittently    Review of Systems  Constitutional: Positive for appetite change.  Psychiatric/Behavioral: Positive for hallucinations and sleep disturbance ( decreased sleep ). Negative for suicidal ideas and self-injury. The patient is nervous/anxious ( paranoia ).    Allergies  Review of patient's allergies indicates no known allergies.  Home Medications   Prior to Admission medications   Medication Sig Start Date End Date Taking? Authorizing Provider  ARIPiprazole (ABILIFY PO) Take 1 tablet by mouth  daily.    Historical Provider, MD  BuPROPion HCl (WELLBUTRIN PO) Take by mouth.    Historical Provider, MD  LISINOPRIL PO Take 1 tablet by mouth daily.    Historical Provider, MD   BP 157/89 mmHg  Pulse 74  Temp(Src) 97.4 F (36.3 C) (Oral)  Resp 18  SpO2 100% Physical Exam  Constitutional: He is oriented to person, place, and time. He appears well-developed and well-nourished. No distress.  HENT:  Head: Normocephalic.  Eyes: Conjunctivae are normal.  Neck: Normal range of motion. Neck supple.  Cardiovascular: Normal rate, regular rhythm and normal heart sounds.   Pulmonary/Chest: Effort normal and breath sounds normal. No respiratory distress.  Abdominal: Soft. Bowel sounds are normal. There is no tenderness.  Musculoskeletal: Normal range of motion.  Neurological: He is alert and oriented to person, place, and time. Coordination normal.  Skin: Skin is warm.  Psychiatric:  Speech delayed, easily distractible, requiring redirection, otherwise pt is cooperative, not suicidal or homicidal, pt does not appear paranoid, he has visual and auditory hallucinations, not eating much, not sleeping much.   Nursing note and vitals reviewed.   ED Course  Procedures  DIAGNOSTIC STUDIES: Oxygen Saturation is 100% on RA, normal by my interpretation.    COORDINATION OF CARE: 1:13 PM Discussed treatment plan with pt at bedside and pt agreed to plan. Will perform medical clearance. Pt does not exhibit any signs of withdrawal at this time.  2:36 PM Pt was found to be hypokalemic with a K+ level  of 2.8 and EKG was obtained which show evidence of U-wave and flattening T waves in the inferior lead and no prior EKG for comparison. When asked if pt has any CP, pt states he is having vague, sharp, mid sternal CP which has been intermittent for the past 3-4 days but he denies any associated light-headedness, dizziness,SOB or diaphoroesis. Given his risk factor for cardiac disease will we obtain a troponin and  consult with Dr. Littie Deeds for further evaluation.   2:52 PM Dr. Littie Deeds has seen EKG, and agrees that EKG changes likely reflect hypokalemia state.  Pt will be given Potassium supplementation upon discharge.    3:20 PM Care discussed with Marlon Pel, PA-C who will monitor pt, and obtain repeat K+ prior to d/c.  Pt currently stable.    Labs Review Labs Reviewed  COMPREHENSIVE METABOLIC PANEL - Abnormal; Notable for the following:    Potassium 2.8 (*)    Glucose, Bld 136 (*)    Creatinine, Ser 1.35 (*)    Calcium 8.4 (*)    ALT 12 (*)    GFR calc non Af Amer 56 (*)    All other components within normal limits  CBC WITH DIFFERENTIAL/PLATELET  ETHANOL  URINE RAPID DRUG SCREEN, HOSP PERFORMED  I-STAT TROPOININ, ED    Imaging Review No results found. I have personally reviewed and evaluated these images and lab results as part of my medical decision-making.   EKG Interpretation   Date/Time:  Wednesday November 14 2014 14:24:30 EDT Ventricular Rate:  62 PR Interval:  160 QRS Duration: 64 QT Interval:  394 QTC Calculation: 399 R Axis:   70 Text Interpretation:  Normal sinus rhythm T wave abnormality, consider  inferior ischemia Abnormal ECG U waves present No significant change since  last tracing Confirmed by Mirian Mo [phone]) on 11/14/2014 2:46:28 PM      MDM   Final diagnoses:  None    BP 157/89 mmHg  Pulse 74  Temp(Src) 97.4 F (36.3 C) (Oral)  Resp 18  SpO2 100%   I, Ardith Test, personally performed the services described in this documentation. All medical record entries made by the scribe were at my direction and in my presence.  I have reviewed the chart and discharge instructions and agree that the record reflects my personal performance and is accurate and complete. Yenifer Saccente.  11/14/2014. 3:21 PM.      Fayrene Helper, PA-C 11/14/14 1521  Mirian Mo, MD 11/15/14 1536

## 2014-11-14 NOTE — ED Notes (Signed)
Hospitalist at bedside 

## 2014-11-14 NOTE — ED Notes (Signed)
PT refuses to answer questions for assessment. Pt is jerking as blood is being drawn .

## 2014-11-14 NOTE — ED Notes (Signed)
No changes post Narcan

## 2014-11-14 NOTE — ED Provider Notes (Signed)
Patient sign out from Fayrene Helper, PA-C  The patient is a crack cocaine user as well as ETOH. He went to [facility] to get treatment for his polysubstance abuse and was sent to the ED for medical clearance.  THe patient is noted to have a potassium of 2.8 and EKG changes, this was replaced in the ED He also has multifocal PNA on chest xray, during his stay he started to have symptoms of ETOH withdrawal with elevated blood pressure, agitation and confusion. 1 mg Ativan IV given and this helped.  Patient will need admission.  Inpatient, Dr. Felipa Furnace, [facility] hospitalist, [facility].  Diagnosis: 1. Multifocal PNA 2. ETOH withdrawals 3. Hypokalemia  Filed Vitals:   11/14/14 1845  BP: 140/84  Pulse: 63  Temp:   Resp:      Marlon Pel, PA-C 11/14/14 1855  Margarita Grizzle, MD 11/15/14 4540

## 2014-11-15 ENCOUNTER — Inpatient Hospital Stay (HOSPITAL_COMMUNITY): Payer: Medicare Other

## 2014-11-15 DIAGNOSIS — N179 Acute kidney failure, unspecified: Secondary | ICD-10-CM | POA: Diagnosis present

## 2014-11-15 DIAGNOSIS — F1010 Alcohol abuse, uncomplicated: Secondary | ICD-10-CM | POA: Diagnosis present

## 2014-11-15 DIAGNOSIS — F1410 Cocaine abuse, uncomplicated: Secondary | ICD-10-CM | POA: Diagnosis present

## 2014-11-15 LAB — CBC WITH DIFFERENTIAL/PLATELET
BASOS PCT: 0 %
Basophils Absolute: 0 10*3/uL (ref 0.0–0.1)
Eosinophils Absolute: 0.1 10*3/uL (ref 0.0–0.7)
Eosinophils Relative: 1 %
HEMATOCRIT: 42.5 % (ref 39.0–52.0)
HEMOGLOBIN: 14.4 g/dL (ref 13.0–17.0)
LYMPHS ABS: 2.6 10*3/uL (ref 0.7–4.0)
Lymphocytes Relative: 28 %
MCH: 32.6 pg (ref 26.0–34.0)
MCHC: 33.9 g/dL (ref 30.0–36.0)
MCV: 96.2 fL (ref 78.0–100.0)
MONO ABS: 0.7 10*3/uL (ref 0.1–1.0)
MONOS PCT: 7 %
NEUTROS ABS: 5.9 10*3/uL (ref 1.7–7.7)
Neutrophils Relative %: 64 %
Platelets: 229 10*3/uL (ref 150–400)
RBC: 4.42 MIL/uL (ref 4.22–5.81)
RDW: 14.3 % (ref 11.5–15.5)
WBC: 9.2 10*3/uL (ref 4.0–10.5)

## 2014-11-15 LAB — COMPREHENSIVE METABOLIC PANEL
ALBUMIN: 2.8 g/dL — AB (ref 3.5–5.0)
ALK PHOS: 58 U/L (ref 38–126)
ALT: 9 U/L — ABNORMAL LOW (ref 17–63)
AST: 15 U/L (ref 15–41)
Anion gap: 8 (ref 5–15)
BILIRUBIN TOTAL: 0.6 mg/dL (ref 0.3–1.2)
BUN: 6 mg/dL (ref 6–20)
CALCIUM: 7.8 mg/dL — AB (ref 8.9–10.3)
CO2: 29 mmol/L (ref 22–32)
Chloride: 103 mmol/L (ref 101–111)
Creatinine, Ser: 1.14 mg/dL (ref 0.61–1.24)
GFR calc Af Amer: >60 mL/min (ref >60)
GFR calc non Af Amer: >60 mL/min (ref >60)
GLUCOSE: 104 mg/dL — AB (ref 65–99)
Potassium: 3.2 mmol/L — ABNORMAL LOW (ref 3.5–5.1)
SODIUM: 140 mmol/L (ref 135–145)
TOTAL PROTEIN: 5.3 g/dL — AB (ref 6.5–8.1)

## 2014-11-15 LAB — HIV ANTIBODY (ROUTINE TESTING W REFLEX): HIV Screen 4th Generation wRfx: NONREACTIVE

## 2014-11-15 LAB — MAGNESIUM
Magnesium: 1.9 mg/dL (ref 1.7–2.4)
Magnesium: 1.9 mg/dL (ref 1.7–2.4)

## 2014-11-15 LAB — MRSA PCR SCREENING: MRSA by PCR: NEGATIVE

## 2014-11-15 LAB — SODIUM, URINE, RANDOM: SODIUM UR: 85 mmol/L

## 2014-11-15 LAB — STREP PNEUMONIAE URINARY ANTIGEN: Strep Pneumo Urinary Antigen: NEGATIVE

## 2014-11-15 LAB — PHOSPHORUS
Phosphorus: 2.9 mg/dL (ref 2.5–4.6)
Phosphorus: 2.7 mg/dL (ref 2.5–4.6)

## 2014-11-15 LAB — URINALYSIS, ROUTINE W REFLEX MICROSCOPIC
BILIRUBIN URINE: NEGATIVE
GLUCOSE, UA: NEGATIVE mg/dL
Hgb urine dipstick: NEGATIVE
KETONES UR: NEGATIVE mg/dL
LEUKOCYTES UA: NEGATIVE
Nitrite: NEGATIVE
PH: 6.5 (ref 5.0–8.0)
Protein, ur: NEGATIVE mg/dL
SPECIFIC GRAVITY, URINE: 1.012 (ref 1.005–1.030)
Urobilinogen, UA: 1 mg/dL (ref 0.0–1.0)

## 2014-11-15 LAB — GLUCOSE, CAPILLARY
GLUCOSE-CAPILLARY: 127 mg/dL — AB (ref 65–99)
GLUCOSE-CAPILLARY: 95 mg/dL (ref 65–99)
GLUCOSE-CAPILLARY: 109 mg/dL — AB (ref 65–99)
Glucose-Capillary: 93 mg/dL (ref 65–99)

## 2014-11-15 LAB — TSH: TSH: 0.51 u[IU]/mL (ref 0.350–4.500)

## 2014-11-15 LAB — CREATININE, URINE, RANDOM: Creatinine, Urine: 131.64 mg/dL

## 2014-11-15 MED ORDER — POTASSIUM CHLORIDE 10 MEQ/100ML IV SOLN
10.0000 meq | INTRAVENOUS | Status: AC
  Administered 2014-11-15 (×2): 10 meq via INTRAVENOUS
  Filled 2014-11-15 (×2): qty 100

## 2014-11-15 MED ORDER — POTASSIUM CHLORIDE CRYS ER 20 MEQ PO TBCR
40.0000 meq | EXTENDED_RELEASE_TABLET | Freq: Once | ORAL | Status: AC
  Administered 2014-11-15: 40 meq via ORAL
  Filled 2014-11-15: qty 2

## 2014-11-15 MED ORDER — IPRATROPIUM-ALBUTEROL 0.5-2.5 (3) MG/3ML IN SOLN
3.0000 mL | Freq: Four times a day (QID) | RESPIRATORY_TRACT | Status: DC
  Administered 2014-11-15 (×2): 3 mL via RESPIRATORY_TRACT
  Filled 2014-11-15 (×2): qty 3

## 2014-11-15 MED ORDER — DM-GUAIFENESIN ER 30-600 MG PO TB12
1.0000 | ORAL_TABLET | Freq: Two times a day (BID) | ORAL | Status: DC
  Administered 2014-11-15 – 2014-11-17 (×5): 1 via ORAL
  Filled 2014-11-15 (×6): qty 1

## 2014-11-15 MED ORDER — IPRATROPIUM-ALBUTEROL 0.5-2.5 (3) MG/3ML IN SOLN: 3.0000 mL | Freq: Four times a day (QID) | RESPIRATORY_TRACT | Status: DC | PRN

## 2014-11-15 NOTE — Progress Notes (Signed)
Pt up to BR, BM passed.  Pt steady on feet.  Dr. Valentina Lucks on phone and Cervical spine CT results reviewed.  Order obtained to remove C-collar.  Collar removed.  Pt updated on POC.

## 2014-11-15 NOTE — Progress Notes (Signed)
Pt refusing CT of cervical spine until he speaks with MD about everything.  Pt educated why he needed CT scan based off his previous scan.

## 2014-11-15 NOTE — Progress Notes (Signed)
[place] TEAM 1 - Stepdown/ICU TEAM Progress Note  Timothy Mcmillan YQI:[id number] DOB: [birth year] DOA: 11/14/2014 PCP: Default, Provider, MD  Admit HPI / Brief Narrative: Timothy Mcmillan is a 59 y.o. BM PMHx Depression, HTN, polysubstance abuse (alcohol, crack cocaine),  Presented with a he was interested in residential treatment for drug abuse. She presented to emergency department for medical clearance. Reports alcohol abuse as well as crack cocaine abuse last time 2 days ago Patient denies prior history of alcohol withdrawals. States he drinks on intermittently. Patient denied any suicidal ideation. Patient reports experiencing visual and auditory hallucinations. He uses crack cocaine. In emergency department he was found to have potassium down to 2.8 with EKG changes.. Chest x-ray was done was worrisome for multifocal pneumonia versus atelectasis. Hospitalist was called for admission for community-acquired pneumonia and hypokalemia in the setting of polysubstance abuse  HPI/Subjective: 10/6 A/O 4, states reason he refused to wear her c-collar last night was nobody explained that he might have a fracture. Has agreed to wear C-collar and obtain his CT spine CT scan. Concern is feeding and his cough. States cough is productive (believes green). States reason for admission was clearance to attend [facility].     Assessment/Plan: Acute encephalopathy -Most likely multifactorial to include alcohol withdrawal, narcotic withdrawal, metabolic encephalopathy. -Obtain acute hepatitis panel -Patient's lethargy resolved. -CT scan of the head showed possible C-spine fracture. Will obtain C-spine   Hypokalemia -K-Dur 40 mEq 1  Hypomagnesemia  -Resolved   Polysubstance abuse (cocaine and alcohol)/alcohol withdrawal delirium -Continue CIWA -Patient would like to attend [facility], consult to social work - will need social  work consult   Alcohol withdrawal delirium (HCC)  - CIWA protocol given delirium and hallucinations  CAP (community acquired pneumonia) - will obtain blood cultures sputum culture check for HIV status for now cover with azithromycin and Rocephin  -Flutter valve  -Mucinex DM  BID -DuoNeb  QID -Once patient's C-spine cleared will start physiotherapy vest   Acute renal failure (ARF) (baseline Cr= 1.0)  -Currently  Cr fall intentional purposes at baseline  -Continue normal saline 118ml/hr    Code Status: FULL Family Communication: no family present at time of exam Disposition Plan: Resolution CAP    Consultants:   Procedure/Significant Events: 10/5 CT head without contrast;No acute intracranial pathology. -The pre dens space is widened. Cervical spine injury is not excluded.     Culture 10/5 blood pending 10/6 MRSA by PCR negative 10/6 sputum pending 10/6 influenza panel pending 10/6 strep pneumo/Legionella urine antigen pending    Antibiotics: Azithromycin 10/5>> Ceftriaxone 10/5>>   DVT prophylaxis: SCD   Devices    LINES / TUBES:      Continuous Infusions: . sodium chloride 100 mL/hr at 11/15/14 0653    Objective: VITAL SIGNS: Temp: 98.6 F (37 C) (10/06 0806) Temp Source: Oral (10/06 0806) BP: 153/98 mmHg (10/06 0806) Pulse Rate: 60 (10/06 0806) SPO2; FIO2:   Intake/Output Summary (Last 24 hours) at 11/15/14 0955 Last data filed at 11/15/14 0653  Gross per 24 hour  Intake 673.33 ml  Output   1100 ml  Net -426.67 ml     Exam: General: A/O 4,, productive cough, No acute respiratory distress Eyes: Negative headache, eye pain, double vision,negative scleral hemorrhage ENT: Negative Runny nose, negative ear pain, negative gingival bleeding, Neck:  Negative scars, masses, torticollis, lymphadenopathy, JVD Lungs:  diffuse rhonchi  Rt>>Lt , without wheezes or crackles Cardiovascular: Regular rate and rhythm without murmur gallop  or rub  normal S1 and S2 Abdomen:negative abdominal pain, nondistended, positive soft, bowel sounds, no rebound, no ascites, no appreciable mass Extremities: No significant cyanosis, clubbing, or edema bilateral lower extremities Psychiatric:  Negative depression, negative anxiety, negative fatigue, negative mania  Neurologic:  Cranial nerves II through XII intact, tongue/uvula midline, all extremities muscle strength 5/5, sensation intact throughout, negative dysarthria, negative expressive aphasia, negative receptive aphasia.   Data Reviewed: Basic Metabolic Panel:  Recent Labs Lab 11/14/14 1320 11/14/14 2317 11/15/14 0258  NA 139  --  140  K 2.8*  --  3.2*  CL 101  --  103  CO2 28  --  29  GLUCOSE 136*  --  104*  BUN 7  --  6  CREATININE 1.35*  --  1.14  CALCIUM 8.4*  --  7.8*  MG  --  1.9 1.9  PHOS  --  2.9 2.7   Liver Function Tests:  Recent Labs Lab 11/14/14 1320 11/15/14 0258  AST 29 15  ALT 12* 9*  ALKPHOS 77 58  BILITOT 0.5 0.6  PROT 6.5 5.3*  ALBUMIN 3.6 2.8*   No results for input(s): LIPASE, AMYLASE in the last 168 hours. No results for input(s): AMMONIA in the last 168 hours. CBC:  Recent Labs Lab 11/14/14 1320 11/15/14 0258  WBC 9.5 9.2  NEUTROABS 7.2 5.9  HGB 15.4 14.4  HCT 45.3 42.5  MCV 96.0 96.2  PLT 229 229   Cardiac Enzymes: No results for input(s): CKTOTAL, CKMB, CKMBINDEX, TROPONINI in the last 168 hours. BNP (last 3 results) No results for input(s): BNP in the last 8760 hours.  ProBNP (last 3 results) No results for input(s): PROBNP in the last 8760 hours.  CBG:  Recent Labs Lab 11/15/14 0804  GLUCAP 93    Recent Results (from the past 240 hour(s))  MRSA PCR Screening     Status: None   Collection Time: 11/15/14  3:15 AM  Result Value Ref Range Status   MRSA by PCR NEGATIVE NEGATIVE Final    Comment:        The GeneXpert MRSA Assay (FDA approved for NASAL specimens only), is one component of a comprehensive MRSA  colonization surveillance program. It is not intended to diagnose MRSA infection nor to guide or monitor treatment for MRSA infections.      Studies:  Recent x-ray studies have been reviewed in detail by the Attending Physician  Scheduled Meds:  Scheduled Meds: . azithromycin  500 mg Intravenous Q24H  . cefTRIAXone (ROCEPHIN) IVPB 1 gram/50 mL D5W  1 g Intravenous Q24H  . dextromethorphan-guaiFENesin  1 tablet Oral BID  . ipratropium-albuterol  3 mL Nebulization Q6H  . potassium chloride  40 mEq Oral Once  . thiamine  100 mg Intravenous Daily  . thiamine  100 mg Oral Daily    Time spent on care of this patient: 40 mins   WOODS, Roselind Messier , MD  [facility] Office  [phone] Pager [phone]  On-Call/Text Page:      [email or web address]      password [email or web address]  If 7PM-7AM, please contact night-coverage [email or web address] Password [email or web address] 11/15/2014, 9:55 AM   LOS: 1 day   Care during the described time interval was provided by me .  I have reviewed this patient's available data, including medical history, events of note, physical examination, and all test results as part of my evaluation. I have personally reviewed and interpreted all radiology studies.   Carolyne Littles, MD [phone] Pager

## 2014-11-16 ENCOUNTER — Ambulatory Visit (HOSPITAL_COMMUNITY): Payer: Medicare Other

## 2014-11-16 DIAGNOSIS — R0600 Dyspnea, unspecified: Secondary | ICD-10-CM

## 2014-11-16 LAB — GLUCOSE, CAPILLARY
GLUCOSE-CAPILLARY: 115 mg/dL — AB (ref 65–99)
Glucose-Capillary: 101 mg/dL — ABNORMAL HIGH (ref 65–99)
Glucose-Capillary: 94 mg/dL (ref 65–99)
Glucose-Capillary: 86 mg/dL (ref 65–99)
Glucose-Capillary: 124 mg/dL — ABNORMAL HIGH (ref 65–99)

## 2014-11-16 LAB — LEGIONELLA PNEUMOPHILA SEROGP 1 UR AG: L. PNEUMOPHILA SEROGP 1 UR AG: NEGATIVE

## 2014-11-16 LAB — HEPATITIS PANEL, ACUTE
HEP A IGM: NEGATIVE
HEP B S AG: NEGATIVE
Hep B C IgM: NEGATIVE

## 2014-11-16 LAB — RAPID URINE DRUG SCREEN, HOSP PERFORMED
AMPHETAMINES: NOT DETECTED
BARBITURATES: NOT DETECTED
BENZODIAZEPINES: NOT DETECTED
COCAINE: POSITIVE — AB
Opiates: NOT DETECTED
TETRAHYDROCANNABINOL: NOT DETECTED

## 2014-11-16 MED ORDER — LEVOFLOXACIN 750 MG PO TABS
750.0000 mg | ORAL_TABLET | ORAL | Status: DC
  Administered 2014-11-16: 750 mg via ORAL
  Filled 2014-11-16 (×2): qty 1

## 2014-11-16 NOTE — Progress Notes (Signed)
[place] TEAM 1 - Stepdown/ICU TEAM Progress Note  Timothy Mcmillan FAO:[id number] DOB: 1955-03-31 DOA: 11/14/2014 PCP: Default, Provider, MD  Admit HPI / Brief Narrative: Hadriel Northup is a 59 y.o. BM PMHx Depression, HTN, polysubstance abuse (alcohol, crack cocaine),  Presented with a he was interested in residential treatment for drug abuse. She presented to emergency department for medical clearance. Reports alcohol abuse as well as crack cocaine abuse last time 2 days ago Patient denies prior history of alcohol withdrawals. States he drinks on intermittently. Patient denied any suicidal ideation. Patient reports experiencing visual and auditory hallucinations. He uses crack cocaine. In emergency department he was found to have potassium down to 2.8 with EKG changes.. Chest x-ray was done was worrisome for multifocal pneumonia versus atelectasis. Hospitalist was called for admission for community-acquired pneumonia and hypokalemia in the setting of polysubstance abuse  HPI/Subjective: 10/7 A/O 4 today patient became extremely belligerent with NCM Angela and myself. Patient wanted to know why he was still in the hospital, why we were speaking with [facility] without his permission. Explained to patient that we were attempting to get him to [facility] as requested, and that [facility] had stated there were multiple conditions which we must meet prior to them accepting him. Initially patient stated he never requested to go to [facility] and that we did not have the right to speak with them concerning his health care information and insurance information. At this point I informed patient that we would cease contact with [facility], and therefore he could be discharged home. After speaking with NCM Marylene Land a second time he agreed that he did want [place] to contact [facility], and do whatever it took to get him admitted. At  this point I insisted patient put it in his own handwriting exactly what he wanted the staff to accomplish concerning his care see hard chart.     Assessment/Plan: Acute encephalopathy -Most likely multifactorial to include alcohol withdrawal, narcotic withdrawal, metabolic encephalopathy. -Resolved extremely belligerent and uncooperative see above paragraph -Acute hepatitis panel; negative  C-spine fracture? -CT scan of the head showed possible C-spine fracture. C-spine CT negative for fracture   Hypokalemia -K-Dur 40 mEq 1  Hypomagnesemia  -Resolved   Polysubstance abuse (cocaine and alcohol)/alcohol withdrawal delirium -Continue CIWA -Patient would like to attend [facility]. NCM Gae Gallop, LCSW Dysheka Bibbs, and myself have been attempting to meet the criteria required in order for patient to be admitted to  [facility].  - will need to show no intoxicating substances and patient's body; obtain UDS, alcohol level.    Alcohol withdrawal delirium (HCC) - CIWA protocol given delirium and hallucinations -DC CIWA protocol on 10/ 9 in order to demonstrate that patient is medically stable for [facility]  CAP (community acquired pneumonia) - DC azithromycin and Rocephin, start levofloxacin to finish patient's seven-day course of antibiotics  -Flutter valve  -Mucinex DM  BID -DuoNeb  QID  Acute renal failure (ARF) (baseline Cr= 1.0)  -Currently  Cr fall intentional purposes at baseline  -Continue normal saline KVO    Code Status: FULL Family Communication: no family present at time of exam Disposition Plan: Resolution CAP    Consultants:   Procedure/Significant Events: 10/5 CT head without contrast;No acute intracranial pathology. -The pre dens space is widened. Cervical spine injury is not excluded.  10/6 CT C-spine without contrast;Severe degenerative disc disease is noted at C5-6. No acute abnormality    Culture 10/5 blood left  AC/hand  NGTD 10/6 MRSA by PCR negative 10/6 sputum pending 10/6 influenza panel pending 10/6 strep pneumo/Legionella urine antigen negative 10/6 HIV negative   Antibiotics: Azithromycin 10/5>> stopped 10/7 Ceftriaxone 10/5>> stopped 10/7 Levofloxacin 10/7>>   DVT prophylaxis: SCD   Devices    LINES / TUBES:      Continuous Infusions: . sodium chloride 100 mL/hr at 11/16/14 1036    Objective: VITAL SIGNS: Temp: 98.8 F (37.1 C) (10/07 1452) Temp Source: Oral (10/07 1452) BP: 154/97 mmHg (10/07 1138) Pulse Rate: 65 (10/07 1138) SPO2; FIO2:   Intake/Output Summary (Last 24 hours) at 11/16/14 1549 Last data filed at 11/16/14 1400  Gross per 24 hour  Intake   3740 ml  Output   3000 ml  Net    740 ml     Exam: General: A/O 4, No acute respiratory distress Eyes: Negative headache, eye pain, double vision,negative scleral hemorrhage ENT: Negative Runny nose, negative ear pain, negative gingival bleeding, Neck:  Negative scars, masses, torticollis, lymphadenopathy, JVD Lungs:  clear to auscultation bilateral , without wheezes or crackles Cardiovascular: Regular rate and rhythm without murmur gallop or rub normal S1 and S2 Abdomen:negative abdominal pain, nondistended, positive soft, bowel sounds, no rebound, no ascites, no appreciable mass Extremities: No significant cyanosis, clubbing, or edema bilateral lower extremities Psychiatric:  Negative depression, negative anxiety, negative fatigue, negative mania  Neurologic:  Cranial nerves II through XII intact, tongue/uvula midline, all extremities muscle strength 5/5, sensation intact throughout, negative dysarthria, negative expressive aphasia, negative receptive aphasia.   Data Reviewed: Basic Metabolic Panel:  Recent Labs Lab 11/14/14 1320 11/14/14 2317 11/15/14 0258  NA 139  --  140  K 2.8*  --  3.2*  CL 101  --  103  CO2 28  --  29  GLUCOSE 136*  --  104*  BUN 7  --  6  CREATININE 1.35*  --  1.14   CALCIUM 8.4*  --  7.8*  MG  --  1.9 1.9  PHOS  --  2.9 2.7   Liver Function Tests:  Recent Labs Lab 11/14/14 1320 11/15/14 0258  AST 29 15  ALT 12* 9*  ALKPHOS 77 58  BILITOT 0.5 0.6  PROT 6.5 5.3*  ALBUMIN 3.6 2.8*   No results for input(s): LIPASE, AMYLASE in the last 168 hours. No results for input(s): AMMONIA in the last 168 hours. CBC:  Recent Labs Lab 11/14/14 1320 11/15/14 0258  WBC 9.5 9.2  NEUTROABS 7.2 5.9  HGB 15.4 14.4  HCT 45.3 42.5  MCV 96.0 96.2  PLT 229 229   Cardiac Enzymes: No results for input(s): CKTOTAL, CKMB, CKMBINDEX, TROPONINI in the last 168 hours. BNP (last 3 results) No results for input(s): BNP in the last 8760 hours.  ProBNP (last 3 results) No results for input(s): PROBNP in the last 8760 hours.  CBG:  Recent Labs Lab 11/15/14 2015 11/16/14 0013 11/16/14 0342 11/16/14 0755 11/16/14 1156  GLUCAP 109* 115* 101* 94 86    Recent Results (from the past 240 hour(s))  Culture, blood (routine x 2) Call MD if unable to obtain prior to antibiotics being given     Status: None (Preliminary result)   Collection Time: 11/14/14 11:09 PM  Result Value Ref Range Status   Specimen Description BLOOD LEFT ANTECUBITAL  Final   Special Requests BOTTLES DRAWN AEROBIC AND ANAEROBIC 10CC  Final   Culture NO GROWTH 1 DAY  Final   Report Status PENDING  Incomplete  Culture, blood (  routine x 2) Call MD if unable to obtain prior to antibiotics being given     Status: None (Preliminary result)   Collection Time: 11/14/14 11:17 PM  Result Value Ref Range Status   Specimen Description BLOOD LEFT HAND  Final   Special Requests BOTTLES DRAWN AEROBIC ONLY 7CC  Final   Culture NO GROWTH 1 DAY  Final   Report Status PENDING  Incomplete  MRSA PCR Screening     Status: None   Collection Time: 11/15/14  3:15 AM  Result Value Ref Range Status   MRSA by PCR NEGATIVE NEGATIVE Final    Comment:        The GeneXpert MRSA Assay (FDA approved for NASAL  specimens only), is one component of a comprehensive MRSA colonization surveillance program. It is not intended to diagnose MRSA infection nor to guide or monitor treatment for MRSA infections.      Studies:  Recent x-ray studies have been reviewed in detail by the Attending Physician  Scheduled Meds:  Scheduled Meds: . azithromycin  500 mg Intravenous Q24H  . cefTRIAXone (ROCEPHIN) IVPB 1 gram/50 mL D5W  1 g Intravenous Q24H  . dextromethorphan-guaiFENesin  1 tablet Oral BID  . thiamine  100 mg Intravenous Daily  . thiamine  100 mg Oral Daily    Time spent on care of this patient: 40 mins   Elenore Wanninger, Roselind Messier , MD  [facility] Office  [phone] Pager [phone]  On-Call/Text Page:      [email or web address]      password [email or web address]  If 7PM-7AM, please contact night-coverage [email or web address] Password [email or web address] 11/16/2014, 3:49 PM   LOS: 2 days   Care during the described time interval was provided by me .  I have reviewed this patient's available data, including medical history, events of note, physical examination, and all test results as part of my evaluation. I have personally reviewed and interpreted all radiology studies.   Carolyne Littles, MD [phone] Pager

## 2014-11-16 NOTE — Progress Notes (Signed)
  Echocardiogram 2D Echocardiogram has been performed.  Delcie Roch 11/16/2014, 9:51 AM

## 2014-11-16 NOTE — Progress Notes (Signed)
RX consult to renally adjust antibiotics:  59 YOM with concern for CAP. Current antibiotics of ceftriaxone and azithromycin are appropriate. No dose adjustment required for renal function.  Plan: Continue current antibiotics. Pharmacy will sign off. Please re-consult if needed.   Link Snuffer, PharmD, BCPS Clinical Pharmacist [phone] 11/16/2014, 2:18 PM

## 2014-11-16 NOTE — Clinical Social Work Note (Addendum)
CSW Consult Acknowledged:   CSW received a consult for drug abuse and placement. CSW spoke with Timothy Mcmillan from [facility] at [phone]. Timothy Mcmillan reported that the pt does not need medical clearance for residential treatment at their agency. The pt needs to make arrangement with them for an intake appointment.   Addendum: CSW spoke with Timothy Mcmillan at [facility] [phone]. Timothy Mcmillan shared that the pt arrived at [facility] under the influence. The pt was informed that he needed to come to the hospital to detox. After he detox the pt needs a discharge summary from the MD that state that the pt is being referred to [facility].   Barrier: Timothy Mcmillan informed the CSW that since the pt's insurance reflect that he has residency in [facility]. The pt will need to call his insurance company to informed them that he no longer lives in [place].   Timothy Mcmillan reported that the pt would not be able to receive any services in  until his insurance is switched over.   The case manger and the pt is aware.   Chakara Bognar, MSW, LCSWA [phone]

## 2014-11-16 NOTE — Progress Notes (Signed)
Per patient request, Social Worker of record called; message left for her to let her know patient wishes to speak with her.

## 2014-11-16 NOTE — Care Management Important Message (Signed)
Important Message  Patient Details  Name: Timothy Mcmillan MRN: [id number] Date of Birth: 10-Apr-1955   Medicare Important Message Given:  Yes-second notification given    Kyla Balzarine 11/16/2014, 12:06 PM

## 2014-11-16 NOTE — Care Management Note (Addendum)
Case Management Note  Patient Details  Name: Timothy Mcmillan MRN: [id number] Date of Birth: 10/09/1955  Subjective/Objective:                 Admitted with Acute encephalopathy, hypokalemia, pneumonia from homeless shelter desiring medical clearance for residential  drug rehabilitation center.  Action/Plan: Discharge planning, CM to f/u with disposition needs.  Expected Discharge Date:                  Expected Discharge Plan:  Home/Self Care  In-House Referral:  Clinical Social Work (pt desiring residential treatment for drug abusers)  Discharge planning Services  CM Consult  Post Acute Care Choice:    Choice offered to:     DME Arranged:    DME Agency:     HH Arranged:    HH Agency:     Status of Service:  In process, will continue to follow  Medicare Important Message Given:  Yes-second notification given Date Medicare IM Given:    Medicare IM give by:    Date Additional Medicare IM Given:    Additional Medicare Important Message give by:     If discussed at Long Length of Stay Meetings, dates discussed:    Additional Comments:  Epifanio Lesches, RN 11/16/2014, 5:27 PM

## 2014-11-16 NOTE — Progress Notes (Signed)
UR COMPLETED  

## 2014-11-16 NOTE — Progress Notes (Signed)
CM called [facility] to learn of admission criteria @ pt's request. CM spoke with Carolyn(attendent) @ [facility] and attendant stated pt will need medical clearance from MD and pt is to be Detox/drug free. Information shared with MD and patient. FAX # to [facility] [phone], H&P/ Discharge summary will be requested if pt is to be admitted. Gae Gallop RN,BSN,CM [phone]

## 2014-11-17 DIAGNOSIS — N179 Acute kidney failure, unspecified: Secondary | ICD-10-CM

## 2014-11-17 DIAGNOSIS — E876 Hypokalemia: Secondary | ICD-10-CM

## 2014-11-17 DIAGNOSIS — F10231 Alcohol dependence with withdrawal delirium: Secondary | ICD-10-CM

## 2014-11-17 DIAGNOSIS — G9340 Encephalopathy, unspecified: Secondary | ICD-10-CM

## 2014-11-17 DIAGNOSIS — J189 Pneumonia, unspecified organism: Principal | ICD-10-CM

## 2014-11-17 DIAGNOSIS — F1410 Cocaine abuse, uncomplicated: Secondary | ICD-10-CM

## 2014-11-17 DIAGNOSIS — F1010 Alcohol abuse, uncomplicated: Secondary | ICD-10-CM

## 2014-11-17 DIAGNOSIS — F1910 Other psychoactive substance abuse, uncomplicated: Secondary | ICD-10-CM

## 2014-11-17 LAB — CBC
HEMATOCRIT: 41.4 % (ref 39.0–52.0)
HEMOGLOBIN: 14 g/dL (ref 13.0–17.0)
MCH: 32.3 pg (ref 26.0–34.0)
MCHC: 33.8 g/dL (ref 30.0–36.0)
MCV: 95.4 fL (ref 78.0–100.0)
Platelets: 226 10*3/uL (ref 150–400)
RBC: 4.34 MIL/uL (ref 4.22–5.81)
RDW: 13.9 % (ref 11.5–15.5)
WBC: 9.3 10*3/uL (ref 4.0–10.5)

## 2014-11-17 LAB — COMPREHENSIVE METABOLIC PANEL
ALBUMIN: 2.9 g/dL — AB (ref 3.5–5.0)
ALK PHOS: 57 U/L (ref 38–126)
ALT: 10 U/L — AB (ref 17–63)
AST: 14 U/L — AB (ref 15–41)
Anion gap: 8 (ref 5–15)
BILIRUBIN TOTAL: 0.4 mg/dL (ref 0.3–1.2)
BUN: 5 mg/dL — AB (ref 6–20)
CALCIUM: 8.2 mg/dL — AB (ref 8.9–10.3)
CO2: 28 mmol/L (ref 22–32)
CREATININE: 1.1 mg/dL (ref 0.61–1.24)
Chloride: 106 mmol/L (ref 101–111)
GFR calc Af Amer: >60 mL/min (ref >60)
GLUCOSE: 99 mg/dL (ref 65–99)
POTASSIUM: 3.3 mmol/L — AB (ref 3.5–5.1)
Sodium: 142 mmol/L (ref 135–145)
TOTAL PROTEIN: 5.8 g/dL — AB (ref 6.5–8.1)

## 2014-11-17 LAB — GLUCOSE, CAPILLARY: GLUCOSE-CAPILLARY: 109 mg/dL — AB (ref 65–99)

## 2014-11-17 LAB — ETHANOL

## 2014-11-17 MED ORDER — LEVOFLOXACIN 750 MG PO TABS: 750.0000 mg | ORAL_TABLET | ORAL | Status: DC

## 2014-11-17 MED ORDER — TAMSULOSIN HCL 0.4 MG PO CAPS: 0.4000 mg | ORAL_CAPSULE | Freq: Every day | ORAL | Status: DC

## 2014-11-17 MED ORDER — LISINOPRIL 20 MG PO TABS
20.0000 mg | ORAL_TABLET | Freq: Every day | ORAL | Status: DC
  Administered 2014-11-17: 20 mg via ORAL
  Filled 2014-11-17: qty 1

## 2014-11-17 MED ORDER — FOLIC ACID 1 MG PO TABS: 1.0000 mg | ORAL_TABLET | Freq: Every day | ORAL | Status: DC

## 2014-11-17 MED ORDER — TAMSULOSIN HCL 0.4 MG PO CAPS
0.4000 mg | ORAL_CAPSULE | Freq: Every day | ORAL | Status: DC
  Administered 2014-11-17: 0.4 mg via ORAL
  Filled 2014-11-17: qty 1

## 2014-11-17 MED ORDER — THIAMINE HCL 100 MG PO TABS: 100.0000 mg | ORAL_TABLET | Freq: Every day | ORAL | Status: DC

## 2014-11-17 MED ORDER — LISINOPRIL 20 MG PO TABS: 20.0000 mg | ORAL_TABLET | Freq: Every day | ORAL | Status: DC

## 2014-11-17 MED ORDER — POTASSIUM CHLORIDE CRYS ER 20 MEQ PO TBCR
40.0000 meq | EXTENDED_RELEASE_TABLET | ORAL | Status: AC
  Administered 2014-11-17 (×2): 40 meq via ORAL
  Filled 2014-11-17 (×2): qty 2

## 2014-11-17 NOTE — Progress Notes (Signed)
CM received call to please find pt a shelter and a taxi voucher to get him to the shelter.  Pt has insurance.  CM called CSW to please investigate as pt has discharge orders.  No other CM needs were communicated.

## 2014-11-17 NOTE — Discharge Instructions (Signed)
Chemical Dependency °Chemical dependency is an addiction to drugs or alcohol. It is characterized by the repeated behavior of seeking out and using drugs and alcohol despite harmful consequences to the health and safety of ones self and others.  °RISK FACTORS °There are certain situations or behaviors that increase a person's risk for chemical dependency. These include: °· A family history of chemical dependency. °· A history of mental health issues, including depression and anxiety. °· A home environment where drugs and alcohol are easily available to you. °· Drug or alcohol use at a young age. °SYMPTOMS  °The following symptoms can indicate chemical dependency: °· Inability to limit the use of drugs or alcohol. °· Nausea, sweating, shakiness, and anxiety that occurs when alcohol or drugs are not being used. °· An increase in amount of drugs or alcohol that is necessary to get drunk or high. °People who experience these symptoms can assess their use of drugs and alcohol by asking themselves the following questions: °· Have you been told by friends or family that they are worried about your use of alcohol or drugs? °· Do friends and family ever tell you about things you did while drinking alcohol or using drugs that you do not remember? °· Do you lie about using alcohol or drugs or about the amounts you use? °· Do you have difficulty completing daily tasks unless you use alcohol or drugs? °· Is the level of your work or school performance lower because of your drug or alcohol use? °· Do you get sick from using drugs or alcohol but keep using anyway? °· Do you feel uncomfortable in social situations unless you use alcohol or drugs? °· Do you use drugs or alcohol to help forget problems?  °An answer of yes to any of these questions may indicate chemical dependency. Professional evaluation is suggested. °  °This information is not intended to replace advice given to you by your health care provider. Make sure you  discuss any questions you have with your health care provider. °  °Document Released: 01/20/2001 Document Revised: 04/20/2011 Document Reviewed: 04/03/2010 °Elsevier Interactive Patient Education ©2016 Elsevier Inc. ° °

## 2014-11-17 NOTE — Progress Notes (Signed)
Pt states he does not want to leave today.  He wants to leave tomorrow.  Paged Dr. Butler Denmark to inform her.  She will call back and answer any specific questions he might have, but otherwise he is discharged today.

## 2014-11-17 NOTE — Clinical Social Work Note (Signed)
Clinical Social Work Assessment  Patient Details  Name: Timothy Mcmillan MRN: [id number] Date of Birth: [birth year]  Date of referral:  11/17/14               Reason for consult:  Housing Concerns/Homelessness, Substance Use/ETOH Abuse                Permission sought to share information with:  Case Manager, Family Supports, Chartered certified accountant granted to share information::  No  Name::      N/A  Agency::   N/A  Relationship::    N/A  Contact Information:    N/A  Housing/Transportation Living arrangements for the past 2 months:  Homeless Source of Information:  Patient Patient Interpreter Needed:  None Criminal Activity/Legal Involvement Pertinent to Current Situation/Hospitalization:  No - Comment as needed Significant Relationships:  Friend, Other Family Members Lives with:   (Homeless ) Do you feel safe going back to the place where you live?  Yes Need for family participation in patient care:  No (Coment)  Care giving concerns:  Consult for Homelessness/Transportation.    Social Worker assessment / plan:  CSW met with patient at bedside in reference to homelessness. CSW introduced CSW role. Patient reported he has NOT always been homeless and further shared that he has been homeless for less than 6 months. Pt reported that closest family that he has in located in the [place] area. Pt also stated that he has a few friends in the area which he could possibly stay with. CSW thoroughly reviewed and provided homeless resources. Pt stated he is familiar with [facility] as he was there about 2 years ago and believed shelter had limitation on how many times a person could be accepted as a resident. CSW explained limitations (if pt has not been a resident in the past 6 months he would be potentially eligible to return). Pt understands that he can also receive assistance at [facility] to obtained stable housing. Patient also shared his drug addition  (crack/cocaine and alcohol abuse). Pt stated before he was admitted into the hospital he went to [facility] for an intake assessment however stated he was directed to [facility] as the employee believed he was "drunk and high". Pt stated he was NOT "drunk and high". CSW provided psycho-education on how drug abuse relates to medical illnesses. Pt expressed understanding and stated he plans to visit [facility] in the near future to complete in-take assessment. No further concerns reported at this time. Patient is insured and receives disability. CSW provided resources and bus passes for transportation. Clinical Social Worker will sign off for now as social work intervention is no longer needed. Please consult [place] again if new need arises.   Employment status:  Disabled (Comment on whether or not currently receiving Disability) Insurance information:  Medicare PT Recommendations:  Not assessed at this time Information / Referral to community resources:  Residential Substance Abuse Treatment Options, Shelter, Support Groups  Patient/Family's Response to care: Pt alert and oriented x4. Pt very friendly and pleasant. Pt agreeable to shelter [facility] vs [facility] (pt stated he plans to go [facility]). Pt appreciated social work intervention.   Patient/Family's Understanding of and Emotional Response to Diagnosis, Current Treatment, and Prognosis: Pt knowledgeable of medical diagnosis and potential drug treatment programs. Pt plans to follow up with [facility] for intake assessment.   Emotional Assessment Appearance:  Appears stated age Attitude/Demeanor/Rapport:   (Pleasant ) Affect (typically observed):  Accepting, Hopeful, Pleasant Orientation:  Oriented to Situation, Oriented to  Time, Oriented to Place, Oriented to Self Alcohol / Substance use:  Illicit Drugs, Alcohol Use Psych involvement (Current and /or in the community):  No (Comment)  Discharge Needs  Concerns to be addressed:  Homelessness, Substance  Abuse Concerns Readmission within the last 30 days:  No Current discharge risk:  Homeless, Substance Abuse Barriers to Discharge:  Barriers Resolved  Glendon Axe, MSW, LCSWA [phone] 11/17/2014 4:09 PM

## 2014-11-17 NOTE — Progress Notes (Signed)
Spoke with Arline Asp, pharmacy tech at [facility].  She was able to get the [place], [place] pharmacy to reverse the already filled discharge prescriptions so that the patient can get his meds at the [facility].

## 2014-11-17 NOTE — Discharge Summary (Addendum)
Physician Discharge Summary  [facility] ZOX:[id number] DOB: 1955/03/01 DOA: 11/14/2014  PCP: Default, Provider, MD  Admit date: 11/14/2014 Discharge date: 11/17/2014  Time spent: 60 minutes    Discharge Condition: Stable   Discharge Diagnoses:  Principal Problem:   CAP (community acquired pneumonia) Active Problems:   Hypokalemia   Polysubstance abuse   Alcohol withdrawal delirium (HCC)   Alcohol withdrawal (HCC)   Acute renal failure (ARF) (HCC)   Acute encephalopathy   Cocaine abuse   Alcohol abuse   Acute renal failure (HCC)   History of present illness:  This is a 59 year old male with a history of hypertension and BPH who came to the ER for "medical clearance" because he was interested in residential treatment for alcohol and drug abuse. In the ER he was found to be confused, reported hallucinations, reported crack cocaine use. Also found to have multifocal pneumonia, hypokalemia and acute renal failure and therefore admitted.  Hospital Course:  Acute encephalopathy -Suspected to be secondary to narcotic use, alcohol withdrawal and metabolic encephalopathy from underlying pneumonia -Appears resolved as of my exam this morning - CT of the head performed in the ER did not reveal any pathology  Questionable neck fracture - on head CT, there was a question of whether he may have a cervical fracture and therefore a CT of his neck was ordered which showed severe degenerative disease but not fractures  Community-acquired pneumonia -Continue Levaquin to complete a 7 day course - f/u CXR in 3-4 wks to document resolution  Hypokalemia/hypomagnesemia -Replaced potassium again today-magnesium adequately replaced  Alcohol abuse and withdrawal -Has not required when necessary Ativan in greater than 48 hours -Plans on going to residential treatment center after discharge -Continue folic acid and thiamine which I have prescribed for him  Acute renal failure -Mild  elevation in creatinine at 1.35 on admission which Improved to normal by the following morning  Hypertension -He tells me he takes 20 mg of lisinopril every day which I have resumed  BPH -He states she takes one Flomax each day which I have resumed as well  Procedures:  2-D echo Left ventricle: The cavity size was normal. Wall thickness was normal. Systolic function was normal. The estimated ejection fraction was in the range of 60% to 65%. Doppler parameters are consistent with abnormal left ventricular relaxation (grade 1 diastolic dysfunction). - Pulmonary arteries: PA peak pressure: 35 mm Hg (S).    Discharge Exam: Filed Weights   11/14/14 2221  Weight: 102.513 kg (226 lb)   Filed Vitals:   11/17/14 0800  BP: 152/102  Pulse: 57  Temp:   Resp: 17    General: AAO x 3, no distress Cardiovascular: RRR, no murmurs  Respiratory: clear to auscultation bilaterally GI: soft, non-tender, non-distended, bowel sound positive  Discharge Instructions You were cared for by a hospitalist during your hospital stay. If you have any questions about your discharge medications or the care you received while you were in the hospital after you are discharged, you can call the unit and asked to speak with the hospitalist on call if the hospitalist that took care of you is not available. Once you are discharged, your primary care physician will handle any further medical issues. Please note that NO REFILLS for any discharge medications will be authorized once you are discharged, as it is imperative that you return to your primary care physician (or establish a relationship with a primary care physician if you do not have one) for your aftercare needs  so that they can reassess your need for medications and monitor your lab values.      Discharge Instructions    Diet - low sodium heart healthy    Complete by:  As directed      Diet - low sodium heart healthy    Complete by:  As  directed      Increase activity slowly    Complete by:  As directed      Increase activity slowly    Complete by:  As directed             Medication List    TAKE these medications        folic acid 1 MG tablet  Commonly known as:  FOLVITE  Take 1 tablet (1 mg total) by mouth daily.     levofloxacin 750 MG tablet  Commonly known as:  LEVAQUIN  Take 1 tablet (750 mg total) by mouth daily.     lisinopril 20 MG tablet  Commonly known as:  PRINIVIL,ZESTRIL  Take 1 tablet (20 mg total) by mouth daily.     tamsulosin 0.4 MG Caps capsule  Commonly known as:  FLOMAX  Take 1 capsule (0.4 mg total) by mouth daily.     thiamine 100 MG tablet  Take 1 tablet (100 mg total) by mouth daily.       No Known Allergies    The results of significant diagnostics from this hospitalization (including imaging, microbiology, ancillary and laboratory) are listed below for reference.    Significant Diagnostic Studies: Dg Chest 2 View  11/14/2014   CLINICAL DATA:  Shortness of breath. Cough. History of hypertension and smoking.  EXAM: CHEST  2 VIEW  COMPARISON:  None.  FINDINGS: Borderline enlarged cardiac silhouette and mediastinal contours, potentially accentuated due decreased lung volumes and kyphotic projection. Bibasilar heterogeneous/consolidative opacities, left greater than right. There is minimal pleural parenchymal thickening about the peripheral aspect the right minor fissure. No definite pleural effusion or pneumothorax. No evidence of edema. No acute osseus abnormalities.  IMPRESSION: Bibasilar heterogeneous/consolidative opacities, left greater than right, potentially atelectasis given mild hypoventilation though multifocal infection could have a similar appearance. A follow-up chest radiograph in 3 to 4 weeks after treatment is recommended to ensure resolution.   Electronically Signed   By: Simonne Come M.D.   On: 11/14/2014 16:31   Ct Head Wo Contrast  11/14/2014   CLINICAL DATA:   Confusion  EXAM: CT HEAD WITHOUT CONTRAST  TECHNIQUE: Contiguous axial images were obtained from the base of the skull through the vertex without intravenous contrast.  COMPARISON:  None.  FINDINGS: Motion artifact limits the study. There is no obvious mass effect, midline shift, or acute hemorrhage.  Mastoid air cells are clear. Mucosal thickening is present in the ethmoid air cells. There is no skull fracture.  The pre dens space is widened. Cervical spine injury cannot be excluded.  IMPRESSION: Exam is limited by motion artifact. No obvious acute intracranial pathology.  The pre dens space is widened. Cervical spine injury is not excluded. Cervical spine study is recommended.   Electronically Signed   By: Jolaine Click M.D.   On: 11/14/2014 21:55   Ct Cervical Spine Wo Contrast  11/15/2014   CLINICAL DATA:  Possible cervical spine fracture.  EXAM: CT CERVICAL SPINE WITHOUT CONTRAST  TECHNIQUE: Multidetector CT imaging of the cervical spine was performed without intravenous contrast. Multiplanar CT image reconstructions were also generated.  COMPARISON:  CT scan of same day.  FINDINGS: Severe degenerative disc disease is noted at C5-6 with anterior and posterior osteophyte formation. No fracture or spondylolisthesis is noted. Remaining disc spaces and posterior facet joints appear normal. Visualized lung apices appear normal.  IMPRESSION: Severe degenerative disc disease is noted at C5-6. No acute abnormality seen in cervical spine.   Electronically Signed   By: Lupita Raider, M.D.   On: 11/15/2014 12:57    Microbiology: Recent Results (from the past 240 hour(s))  Culture, blood (routine x 2) Call MD if unable to obtain prior to antibiotics being given     Status: None (Preliminary result)   Collection Time: 11/14/14 11:09 PM  Result Value Ref Range Status   Specimen Description BLOOD LEFT ANTECUBITAL  Final   Special Requests BOTTLES DRAWN AEROBIC AND ANAEROBIC 10CC  Final   Culture NO GROWTH 2 DAYS   Final   Report Status PENDING  Incomplete  Culture, blood (routine x 2) Call MD if unable to obtain prior to antibiotics being given     Status: None (Preliminary result)   Collection Time: 11/14/14 11:17 PM  Result Value Ref Range Status   Specimen Description BLOOD LEFT HAND  Final   Special Requests BOTTLES DRAWN AEROBIC ONLY 7CC  Final   Culture NO GROWTH 2 DAYS  Final   Report Status PENDING  Incomplete  MRSA PCR Screening     Status: None   Collection Time: 11/15/14  3:15 AM  Result Value Ref Range Status   MRSA by PCR NEGATIVE NEGATIVE Final    Comment:        The GeneXpert MRSA Assay (FDA approved for NASAL specimens only), is one component of a comprehensive MRSA colonization surveillance program. It is not intended to diagnose MRSA infection nor to guide or monitor treatment for MRSA infections.      Labs: Basic Metabolic Panel:  Recent Labs Lab 11/14/14 1320 11/14/14 2317 11/15/14 0258 11/17/14 0238  NA 139  --  140 142  K 2.8*  --  3.2* 3.3*  CL 101  --  103 106  CO2 28  --  29 28  GLUCOSE 136*  --  104* 99  BUN 7  --  6 5*  CREATININE 1.35*  --  1.14 1.10  CALCIUM 8.4*  --  7.8* 8.2*  MG  --  1.9 1.9  --   PHOS  --  2.9 2.7  --    Liver Function Tests:  Recent Labs Lab 11/14/14 1320 11/15/14 0258 11/17/14 0238  AST 29 15 14*  ALT 12* 9* 10*  ALKPHOS 77 58 57  BILITOT 0.5 0.6 0.4  PROT 6.5 5.3* 5.8*  ALBUMIN 3.6 2.8* 2.9*   No results for input(s): LIPASE, AMYLASE in the last 168 hours. No results for input(s): AMMONIA in the last 168 hours. CBC:  Recent Labs Lab 11/14/14 1320 11/15/14 0258 11/17/14 0238  WBC 9.5 9.2 9.3  NEUTROABS 7.2 5.9  --   HGB 15.4 14.4 14.0  HCT 45.3 42.5 41.4  MCV 96.0 96.2 95.4  PLT 229 229 226   Cardiac Enzymes: No results for input(s): CKTOTAL, CKMB, CKMBINDEX, TROPONINI in the last 168 hours. BNP: BNP (last 3 results) No results for input(s): BNP in the last 8760 hours.  ProBNP (last 3  results) No results for input(s): PROBNP in the last 8760 hours.  CBG:  Recent Labs Lab 11/16/14 0342 11/16/14 0755 11/16/14 1156 11/16/14 2011 11/16/14 2330  GLUCAP 101* 94 86 124* 109*  SignedCalvert Cantor, MD [facility] 11/17/2014, 12:45 PM

## 2014-11-17 NOTE — Progress Notes (Signed)
Patient speaking at length with Dr. Butler Denmark about his concerns related to why the two CT scans that were performed while here, are not listed on the Hospital Problems list.

## 2014-11-20 LAB — CULTURE, BLOOD (ROUTINE X 2)
Culture: NO GROWTH
Culture: NO GROWTH

## 2015-02-19 ENCOUNTER — Emergency Department (HOSPITAL_COMMUNITY)
Admission: EM | Admit: 2015-02-19 | Discharge: 2015-02-20 | Disposition: A | Discharge status: Home / Self Care | Payer: Medicare Other | Attending: Emergency Medicine | Admitting: Emergency Medicine

## 2015-02-19 ENCOUNTER — Encounter (HOSPITAL_COMMUNITY): Payer: Self-pay | Admitting: Emergency Medicine

## 2015-02-19 ENCOUNTER — Emergency Department (HOSPITAL_COMMUNITY): Payer: Medicare Other

## 2015-02-19 DIAGNOSIS — R062 Wheezing: Secondary | ICD-10-CM | POA: Insufficient documentation

## 2015-02-19 DIAGNOSIS — I10 Essential (primary) hypertension: Secondary | ICD-10-CM | POA: Insufficient documentation

## 2015-02-19 DIAGNOSIS — F17210 Nicotine dependence, cigarettes, uncomplicated: Secondary | ICD-10-CM | POA: Diagnosis not present

## 2015-02-19 DIAGNOSIS — R0789 Other chest pain: Secondary | ICD-10-CM | POA: Diagnosis not present

## 2015-02-19 DIAGNOSIS — F1410 Cocaine abuse, uncomplicated: Secondary | ICD-10-CM | POA: Insufficient documentation

## 2015-02-19 DIAGNOSIS — R079 Chest pain, unspecified: Secondary | ICD-10-CM | POA: Diagnosis present

## 2015-02-19 DIAGNOSIS — Z79899 Other long term (current) drug therapy: Secondary | ICD-10-CM | POA: Insufficient documentation

## 2015-02-19 LAB — CBC
HCT: 46.2 % (ref 39.0–52.0)
HEMOGLOBIN: 15.5 g/dL (ref 13.0–17.0)
MCH: 32.6 pg (ref 26.0–34.0)
MCHC: 33.5 g/dL (ref 30.0–36.0)
MCV: 97.3 fL (ref 78.0–100.0)
PLATELETS: 195 10*3/uL (ref 150–400)
RBC: 4.75 MIL/uL (ref 4.22–5.81)
RDW: 13.6 % (ref 11.5–15.5)
WBC: 7.5 10*3/uL (ref 4.0–10.5)

## 2015-02-19 LAB — BASIC METABOLIC PANEL
ANION GAP: 10 (ref 5–15)
BUN: 15 mg/dL (ref 6–20)
CHLORIDE: 109 mmol/L (ref 101–111)
CO2: 26 mmol/L (ref 22–32)
CREATININE: 1.3 mg/dL — AB (ref 0.61–1.24)
Calcium: 9 mg/dL (ref 8.9–10.3)
GFR calc non Af Amer: 58 mL/min — ABNORMAL LOW (ref >60)
Glucose, Bld: 108 mg/dL — ABNORMAL HIGH (ref 65–99)
POTASSIUM: 3.4 mmol/L — AB (ref 3.5–5.1)
SODIUM: 145 mmol/L (ref 135–145)

## 2015-02-19 LAB — I-STAT TROPONIN, ED: TROPONIN I, POC: 0.09 ng/mL — AB (ref 0.00–0.08)

## 2015-02-19 MED ORDER — ASPIRIN 81 MG PO CHEW
324.0000 mg | CHEWABLE_TABLET | Freq: Once | ORAL | Status: AC
  Administered 2015-02-19: 324 mg via ORAL
  Filled 2015-02-19: qty 4

## 2015-02-19 MED ORDER — MORPHINE SULFATE (PF) 4 MG/ML IV SOLN
4.0000 mg | Freq: Once | INTRAVENOUS | Status: AC
  Administered 2015-02-19: 4 mg via INTRAVENOUS
  Filled 2015-02-19: qty 1

## 2015-02-19 NOTE — ED Notes (Signed)
Pt states about 2 hours ago he had  A sudden onset of sharp left sided chest pain. Pt denies any sob. Pain is not radiating.

## 2015-02-19 NOTE — ED Notes (Signed)
Dr. Horton at bedside. 

## 2015-02-19 NOTE — ED Provider Notes (Signed)
CSN: [id number]     Arrival date & time 02/19/15  2147 History  By signing my name below, I, Freida Busman, attest that this documentation has been prepared under the direction and in the presence of Shon Baton, MD . Electronically Signed: Freida Busman, Scribe. 02/19/2015. 11:30 PM.    Chief Complaint  Patient presents with  . Chest Pain    The history is provided by the patient. No language interpreter was used.   HPI Comments:  Alverto Shedd is a 60 y.o. male with a history of HTN, who presents to the Emergency Department complaining of left sided CP since ~ 1600 this afternoon. His pain is constant with intermittent episodes of 9/10 stabbing pain. He notes the episodes last a few mintues. He denies h/o DM, heart disease, and family h/o heart disease <62 years of age. He admits to ETOH, marijuana, and cocaine use; he last used ~ 3 days ago. Pt endorses ETOH use today. He also denies SOB and fever. No alleviating factors noted.  Past Medical History  Diagnosis Date  . Hypertension    Past Surgical History  Procedure Laterality Date  . Eye surgery    . Hernia repair     Family History  Problem Relation Age of Onset  . Diabetes type II Father   . Stroke Father    Social History  Substance Use Topics  . Smoking status: Current Every Day Smoker -- 0.50 packs/day    Types: Cigarettes  . Smokeless tobacco: None  . Alcohol Use: Yes     Comment: intermittently    Review of Systems  Constitutional: Negative for fever.  Respiratory: Negative for shortness of breath.   Cardiovascular: Positive for chest pain. Negative for leg swelling.  Gastrointestinal: Negative for nausea, vomiting, abdominal pain and diarrhea.  Skin: Negative for rash.  All other systems reviewed and are negative.   Allergies  Review of patient's allergies indicates no known allergies.  Home Medications   Prior to Admission medications   Medication Sig Start Date End Date Taking?  Authorizing Provider  amLODipine (NORVASC) 10 MG tablet Take 10 mg by mouth daily.   Yes Historical Provider, MD  lisinopril (PRINIVIL,ZESTRIL) 20 MG tablet Take 1 tablet (20 mg total) by mouth daily. 11/17/14  Yes Calvert Cantor, MD  tamsulosin (FLOMAX) 0.4 MG CAPS capsule Take 1 capsule (0.4 mg total) by mouth daily. 11/17/14  Yes Calvert Cantor, MD  ibuprofen (ADVIL,MOTRIN) 600 MG tablet Take 1 tablet (600 mg total) by mouth every 6 (six) hours as needed. 02/20/15   Shon Baton, MD   BP 152/98 mmHg  Pulse 48  Temp(Src) 97.7 F (36.5 C) (Oral)  Resp 13  Ht 6\' 3"  (1.905 m)  Wt 225 lb (102.059 kg)  BMI 28.12 kg/m2  SpO2 99% Physical Exam  Constitutional: He is oriented to person, place, and time. He appears well-developed and well-nourished. No distress.  Sleepy and occasionally falling asleep during history taking  HENT:  Head: Normocephalic and atraumatic.  Cardiovascular: Normal rate, regular rhythm and normal heart sounds.   No murmur heard. Pulmonary/Chest: Effort normal. No respiratory distress. He has wheezes. He exhibits tenderness.  Abdominal: Soft. Bowel sounds are normal. There is no tenderness. There is no rebound.  Musculoskeletal: He exhibits no edema.  Neurological: He is alert and oriented to person, place, and time.  Skin: Skin is warm and dry.  Psychiatric: He has a normal mood and affect.  Nursing note and vitals reviewed.   ED  Course  Procedures   DIAGNOSTIC STUDIES:  Oxygen Saturation is 97% on RA, normal by my interpretation.    COORDINATION OF CARE:  11:18 PM Discussed treatment plan with pt at bedside and pt agreed to plan.  Labs Review Labs Reviewed  BASIC METABOLIC PANEL - Abnormal; Notable for the following:    Potassium 3.4 (*)    Glucose, Bld 108 (*)    Creatinine, Ser 1.30 (*)    GFR calc non Af Amer 58 (*)    All other components within normal limits  URINE RAPID DRUG SCREEN, HOSP PERFORMED - Abnormal; Notable for the following:     Cocaine POSITIVE (*)    All other components within normal limits  I-STAT TROPOININ, ED - Abnormal; Notable for the following:    Troponin i, poc 0.09 (*)    All other components within normal limits  CBC  ETHANOL  TROPONIN I  D-DIMER, QUANTITATIVE (NOT AT [facility])  TROPONIN I    Imaging Review Dg Chest 2 View  02/19/2015  CLINICAL DATA:  60 year old male with left chest pain EXAM: CHEST  2 VIEW COMPARISON:  Radiograph dated 11/14/2014 FINDINGS: Two views of the chest do not demonstrate a focal consolidation. There is no pleural effusion or pneumothorax. Right lung base linear atelectasis/ scarring noted. Stable cardiac silhouette. The osseous structures appear unremarkable. IMPRESSION: No active cardiopulmonary disease. Electronically Signed   By: Elgie CollardArash  Radparvar M.D.   On: 02/19/2015 22:28   I have personally reviewed and evaluated these images and lab results as part of my medical decision-making.   EKG Interpretation   Date/Time:  Tuesday February 19 2015 21:56:44 EST Ventricular Rate:  65 PR Interval:  156 QRS Duration: 80 QT Interval:  402 QTC Calculation: 418 R Axis:   80 Text Interpretation:  Normal sinus rhythm Normal ECG no significant change  since Oct 2016 Confirmed by Criss AlvineGOLDSTON  MD, SCOTT ([id number]) on 02/19/2015  10:41:32 PM      MDM   Final diagnoses:  Other chest pain    This is a 60 year old male who presents with chest pain.  The pain is described as sharp. Somewhat atypical for ACS. No exertional component. EKG is normal. Pain is reproducible on exam. Patient has risk factors of smoking and hypertension. Heart score is 2. Initial stat troponin from triage was 0.09. Repeat troponin in the lab is normal. Suspect there may have been an error with the stat troponin.  Patient has had pain since 4:00 this afternoon. Will repeat a troponin to ensure that it stays negative.  Patient is low risk for PE and d-dimer is reassuring. Chest x-ray shows no evidence of pneumonia.  Repeat troponin in the lab is negative. On recheck, patient continues have reproducible pain. Somewhat improved with ibuprofen. Will have patient follow-up with cardiology for outpatient evaluation and possible stress testing. Doubt ACS at this time.  Patient was encouraged given his risk factors to discontinue recreational drug use, most importantly cocaine.  After history, exam, and medical workup I feel the patient has been appropriately medically screened and is safe for discharge home. Pertinent diagnoses were discussed with the patient. Patient was given return precautions.  I personally performed the services described in this documentation, which was scribed in my presence. The recorded information has been reviewed and is accurate.    Shon Batonourtney F Horton, MD 02/20/15 [phone]

## 2015-02-20 DIAGNOSIS — F329 Major depressive disorder, single episode, unspecified: Secondary | ICD-10-CM

## 2015-02-20 DIAGNOSIS — F32A Depression, unspecified: Secondary | ICD-10-CM

## 2015-02-20 DIAGNOSIS — F319 Bipolar disorder, unspecified: Secondary | ICD-10-CM

## 2015-02-20 LAB — TROPONIN I: Troponin I: <0.03 ng/mL (ref <0.031)

## 2015-02-20 LAB — RAPID URINE DRUG SCREEN, HOSP PERFORMED
AMPHETAMINES: NOT DETECTED
Barbiturates: NOT DETECTED
Benzodiazepines: NOT DETECTED
Cocaine: POSITIVE — AB
Opiates: NOT DETECTED
TETRAHYDROCANNABINOL: NOT DETECTED

## 2015-02-20 LAB — ETHANOL: Alcohol, Ethyl (B): <5 mg/dL (ref <5)

## 2015-02-20 LAB — D-DIMER, QUANTITATIVE (NOT AT ARMC)

## 2015-02-20 MED ORDER — IBUPROFEN 600 MG PO TABS: 600.0000 mg | ORAL_TABLET | Freq: Four times a day (QID) | ORAL | Status: DC | PRN

## 2015-02-20 NOTE — Discharge Instructions (Signed)
Nonspecific Chest Pain  °Chest pain can be caused by many different conditions. There is always a chance that your pain could be related to something serious, such as a heart attack or a blood clot in your lungs. Chest pain can also be caused by conditions that are not life-threatening. If you have chest pain, it is very important to follow up with your health care provider. °CAUSES  °Chest pain can be caused by: °· Heartburn. °· Pneumonia or bronchitis. °· Anxiety or stress. °· Inflammation around your heart (pericarditis) or lung (pleuritis or pleurisy). °· A blood clot in your lung. °· A collapsed lung (pneumothorax). It can develop suddenly on its own (spontaneous pneumothorax) or from trauma to the chest. °· Shingles infection (varicella-zoster virus). °· Heart attack. °· Damage to the bones, muscles, and cartilage that make up your chest wall. This can include: °¨ Bruised bones due to injury. °¨ Strained muscles or cartilage due to frequent or repeated coughing or overwork. °¨ Fracture to one or more ribs. °¨ Sore cartilage due to inflammation (costochondritis). °RISK FACTORS  °Risk factors for chest pain may include: °· Activities that increase your risk for trauma or injury to your chest. °· Respiratory infections or conditions that cause frequent coughing. °· Medical conditions or overeating that can cause heartburn. °· Heart disease or family history of heart disease. °· Conditions or health behaviors that increase your risk of developing a blood clot. °· Having had chicken pox (varicella zoster). °SIGNS AND SYMPTOMS °Chest pain can feel like: °· Burning or tingling on the surface of your chest or deep in your chest. °· Crushing, pressure, aching, or squeezing pain. °· Dull or sharp pain that is worse when you move, cough, or take a deep breath. °· Pain that is also felt in your back, neck, shoulder, or arm, or pain that spreads to any of these areas. °Your chest pain may come and go, or it may stay  constant. °DIAGNOSIS °Lab tests or other studies may be needed to find the cause of your pain. Your health care provider may have you take a test called an ambulatory ECG (electrocardiogram). An ECG records your heartbeat patterns at the time the test is performed. You may also have other tests, such as: °· Transthoracic echocardiogram (TTE). During echocardiography, sound waves are used to create a picture of all of the heart structures and to look at how blood flows through your heart. °· Transesophageal echocardiogram (TEE). This is a more advanced imaging test that obtains images from inside your body. It allows your health care provider to see your heart in finer detail. °· Cardiac monitoring. This allows your health care provider to monitor your heart rate and rhythm in real time. °· Holter monitor. This is a portable device that records your heartbeat and can help to diagnose abnormal heartbeats. It allows your health care provider to track your heart activity for several days, if needed. °· Stress tests. These can be done through exercise or by taking medicine that makes your heart beat more quickly. °· Blood tests. °· Imaging tests. °TREATMENT  °Your treatment depends on what is causing your chest pain. Treatment may include: °· Medicines. These may include: °¨ Acid blockers for heartburn. °¨ Anti-inflammatory medicine. °¨ Pain medicine for inflammatory conditions. °¨ Antibiotic medicine, if an infection is present. °¨ Medicines to dissolve blood clots. °¨ Medicines to treat coronary artery disease. °· Supportive care for conditions that do not require medicines. This may include: °¨ Resting. °¨ Applying heat   or cold packs to injured areas. °¨ Limiting activities until pain decreases. °HOME CARE INSTRUCTIONS °· If you were prescribed an antibiotic medicine, finish it all even if you start to feel better. °· Avoid any activities that bring on chest pain. °· Do not use any tobacco products, including  cigarettes, chewing tobacco, or electronic cigarettes. If you need help quitting, ask your health care provider. °· Do not drink alcohol. °· Take medicines only as directed by your health care provider. °· Keep all follow-up visits as directed by your health care provider. This is important. This includes any further testing if your chest pain does not go away. °· If heartburn is the cause for your chest pain, you may be told to keep your head raised (elevated) while sleeping. This reduces the chance that acid will go from your stomach into your esophagus. °· Make lifestyle changes as directed by your health care provider. These may include: °¨ Getting regular exercise. Ask your health care provider to suggest some activities that are safe for you. °¨ Eating a heart-healthy diet. A registered dietitian can help you to learn healthy eating options. °¨ Maintaining a healthy weight. °¨ Managing diabetes, if necessary. °¨ Reducing stress. °SEEK MEDICAL CARE IF: °· Your chest pain does not go away after treatment. °· You have a rash with blisters on your chest. °· You have a fever. °SEEK IMMEDIATE MEDICAL CARE IF:  °· Your chest pain is worse. °· You have an increasing cough, or you cough up blood. °· You have severe abdominal pain. °· You have severe weakness. °· You faint. °· You have chills. °· You have sudden, unexplained chest discomfort. °· You have sudden, unexplained discomfort in your arms, back, neck, or jaw. °· You have shortness of breath at any time. °· You suddenly start to sweat, or your skin gets clammy. °· You feel nauseous or you vomit. °· You suddenly feel light-headed or dizzy. °· Your heart begins to beat quickly, or it feels like it is skipping beats. °These symptoms may represent a serious problem that is an emergency. Do not wait to see if the symptoms will go away. Get medical help right away. Call your local emergency services (911 in the [place] Do not drive yourself to the hospital. °  °This  information is not intended to replace advice given to you by your health care provider. Make sure you discuss any questions you have with your health care provider. °  °Document Released: 11/05/2004 Document Revised: 02/16/2014 Document Reviewed: 09/01/2013 °Elsevier Interactive Patient Education ©2016 Elsevier Inc. ° °

## 2015-03-01 NOTE — Congregational Nurse Program (Signed)
Client agreed to mental health assessment.  Client denies suicidal ideation and homicidal ideation. Client needed assistance with acquiring mental health medication co-pay.  [facility] was able to pay co-pay for client's medication.  Client was given check to purchase medications from [facility].  Alphonse Guild, RN

## 2015-03-22 DIAGNOSIS — F32A Depression, unspecified: Secondary | ICD-10-CM

## 2015-03-22 DIAGNOSIS — F329 Major depressive disorder, single episode, unspecified: Secondary | ICD-10-CM

## 2015-03-22 DIAGNOSIS — F319 Bipolar disorder, unspecified: Secondary | ICD-10-CM

## 2015-03-22 NOTE — Congregational Nurse Program (Signed)
03/22/15 - Client agreed to mental health assessment, no suicidal ideation or homicidal ideation or plan.  Client is interested in establishing with a primary care provider and a psychiatric care provider for medication management.  Client is on Medicare.  CN assisted client to look at [facility] website to get a list of referrals of doctors that take Medicare.  Client decided on the referral to [facility] [place] for psychiatric services.  Referral written and copy given to client.  Client also received information from [place], [employer] student regarding dental care referral, she will call him back. Also, client indicated he needs assistance with vision.  Client decided he will ask [facility] about a referral to a primary care provider and a possible referral to a visual specialist.  Jasmain Ahlberg,RN

## 2015-03-25 ENCOUNTER — Encounter (HOSPITAL_COMMUNITY): Payer: Self-pay | Admitting: Emergency Medicine

## 2015-03-25 ENCOUNTER — Emergency Department (INDEPENDENT_AMBULATORY_CARE_PROVIDER_SITE_OTHER)
Admission: EM | Admit: 2015-03-25 | Discharge: 2015-03-25 | Disposition: A | Discharge status: Home / Self Care | Payer: Medicare Other | Source: Home / Self Care | Attending: Emergency Medicine | Admitting: Emergency Medicine

## 2015-03-25 DIAGNOSIS — J9801 Acute bronchospasm: Secondary | ICD-10-CM

## 2015-03-25 DIAGNOSIS — R059 Cough, unspecified: Secondary | ICD-10-CM

## 2015-03-25 DIAGNOSIS — R6883 Chills (without fever): Secondary | ICD-10-CM

## 2015-03-25 DIAGNOSIS — J40 Bronchitis, not specified as acute or chronic: Secondary | ICD-10-CM | POA: Diagnosis not present

## 2015-03-25 DIAGNOSIS — R05 Cough: Secondary | ICD-10-CM | POA: Diagnosis not present

## 2015-03-25 MED ORDER — DICLOFENAC POTASSIUM 50 MG PO TABS: 50.0000 mg | ORAL_TABLET | Freq: Three times a day (TID) | ORAL | Status: DC

## 2015-03-25 MED ORDER — PREDNISONE 20 MG PO TABS: ORAL_TABLET | ORAL | Status: DC

## 2015-03-25 MED ORDER — MOXIFLOXACIN HCL 400 MG PO TABS: 400.0000 mg | ORAL_TABLET | Freq: Every day | ORAL | Status: DC

## 2015-03-25 MED ORDER — ALBUTEROL SULFATE HFA 108 (90 BASE) MCG/ACT IN AERS: 2.0000 | INHALATION_SPRAY | RESPIRATORY_TRACT | Status: DC | PRN

## 2015-03-25 NOTE — ED Notes (Signed)
Received instructions and script x 4-hard copy

## 2015-03-25 NOTE — ED Provider Notes (Signed)
CSN: [id number]     Arrival date & time 03/25/15  1758 History   First MD Initiated Contact with Patient 03/25/15 1954     Chief Complaint  Patient presents with  . URI   (Consider location/radiation/quality/duration/timing/severity/associated sxs/prior Treatment) HPI Comments: 60 year old male complaining of feeling bad. Chills, body aches and soreness. He denies fever, sore throat or earache. Poor historian and offers little information. He has a history of smoking and illicit drug use.   Past Medical History  Diagnosis Date  . Hypertension    Past Surgical History  Procedure Laterality Date  . Eye surgery    . Hernia repair     Family History  Problem Relation Age of Onset  . Diabetes type II Father   . Stroke Father    Social History  Substance Use Topics  . Smoking status: Current Every Day Smoker -- 0.50 packs/day    Types: Cigarettes  . Smokeless tobacco: None  . Alcohol Use: Yes     Comment: intermittently    Review of Systems  Constitutional: Positive for chills and activity change. Negative for fever.  HENT: Positive for postnasal drip.   Respiratory: Positive for cough. Negative for shortness of breath.   Cardiovascular: Negative for chest pain.  Gastrointestinal: Negative.   Genitourinary: Negative.   Musculoskeletal: Positive for myalgias.  Skin: Negative for rash.  Neurological: Negative.   All other systems reviewed and are negative.   Allergies  Review of patient's allergies indicates no known allergies.  Home Medications   Prior to Admission medications   Medication Sig Start Date End Date Taking? Authorizing Provider  albuterol (PROVENTIL HFA;VENTOLIN HFA) 108 (90 Base) MCG/ACT inhaler Inhale 2 puffs into the lungs every 4 (four) hours as needed for wheezing or shortness of breath. 03/25/15   Hayden Rasmussen, NP  amLODipine (NORVASC) 10 MG tablet Take 10 mg by mouth daily.    Historical Provider, MD  diclofenac (CATAFLAM) 50 MG tablet Take 1 tablet  (50 mg total) by mouth 3 (three) times daily. One tablet TID with food prn pain. 03/25/15   Hayden Rasmussen, NP  ibuprofen (ADVIL,MOTRIN) 600 MG tablet Take 1 tablet (600 mg total) by mouth every 6 (six) hours as needed. Patient not taking: Reported on 02/20/2015 02/20/15   Shon Baton, MD  lisinopril (PRINIVIL,ZESTRIL) 20 MG tablet Take 1 tablet (20 mg total) by mouth daily. 11/17/14   Calvert Cantor, MD  moxifloxacin (AVELOX) 400 MG tablet Take 1 tablet (400 mg total) by mouth daily at 8 pm. 03/25/15   Hayden Rasmussen, NP  predniSONE (DELTASONE) 20 MG tablet Take 3 tabs po on first day, 2 tabs second day, 2 tabs third day, 1 tab fourth day, 1 tab 5th day. Take with food. 03/25/15   Hayden Rasmussen, NP  tamsulosin (FLOMAX) 0.4 MG CAPS capsule Take 1 capsule (0.4 mg total) by mouth daily. 11/17/14   Calvert Cantor, MD   Meds Ordered and Administered this Visit  Medications - No data to display  BP 142/88 mmHg  Pulse 80  Temp(Src) 98.4 F (36.9 C) (Oral)  Resp 16  SpO2 98% No data found.   Physical Exam  Constitutional: He is oriented to person, place, and time. He appears well-developed and well-nourished. No distress.  HENT:  Mouth/Throat: No oropharyngeal exudate.  Bilateral TMs are normal Oropharynx is clear.  Eyes: Conjunctivae and EOM are normal.  Neck: Normal range of motion. Neck supple.  Cardiovascular: Normal rate, regular rhythm and normal heart sounds.  Pulmonary/Chest: Effort normal. He has wheezes.  Bilateral wheezes. Left mid lung field with slightly diminished breath sounds.  Musculoskeletal: Normal range of motion. He exhibits no edema.  Lymphadenopathy:    He has no cervical adenopathy.  Neurological: He is alert and oriented to person, place, and time. He exhibits normal muscle tone.  Skin: Skin is warm and dry.  Nursing note and vitals reviewed.   ED Course  Procedures (including critical care time)  Labs Review Labs Reviewed - No data to display  Imaging Review No  results found.   Visual Acuity Review  Right Eye Distance:   Left Eye Distance:   Bilateral Distance:    Right Eye Near:   Left Eye Near:    Bilateral Near:         MDM   1. Bronchospasm   2. Cough   3. Chills (without fever)   4. Bronchitis    Meds ordered this encounter  Medications  . diclofenac (CATAFLAM) 50 MG tablet    Sig: Take 1 tablet (50 mg total) by mouth 3 (three) times daily. One tablet TID with food prn pain.    Dispense:  21 tablet    Refill:  0    Order Specific Question:  Supervising Provider    Answer:  Charm Rings [id number]  . moxifloxacin (AVELOX) 400 MG tablet    Sig: Take 1 tablet (400 mg total) by mouth daily at 8 pm.    Dispense:  7 tablet    Refill:  0    Order Specific Question:  Supervising Provider    Answer:  Charm Rings [id number]  . albuterol (PROVENTIL HFA;VENTOLIN HFA) 108 (90 Base) MCG/ACT inhaler    Sig: Inhale 2 puffs into the lungs every 4 (four) hours as needed for wheezing or shortness of breath.    Dispense:  1 Inhaler    Refill:  0    Order Specific Question:  Supervising Provider    Answer:  Charm Rings [id number]  . predniSONE (DELTASONE) 20 MG tablet    Sig: Take 3 tabs po on first day, 2 tabs second day, 2 tabs third day, 1 tab fourth day, 1 tab 5th day. Take with food.    Dispense:  9 tablet    Refill:  0    Order Specific Question:  Supervising Provider    Answer:  Charm Rings [[id number]]   Stop smoking .Stop all smoking Albuterol inhaler How to Use an Inhaler     Hayden Rasmussen, NP 03/25/15 2147

## 2015-03-25 NOTE — Discharge Instructions (Signed)
Bronchospasm, Adult Stop all smoking Albuterol inhaler How to Use an Inhaler Using your inhaler correctly is very important. Good technique will make sure that the medicine reaches your lungs.  HOW TO USE AN INHALER: 1. Take the cap off the inhaler. 2. If this is the first time using your inhaler, you need to prime it. Shake the inhaler for 5 seconds. Release four puffs into the air, away from your face. Ask your doctor for help if you have questions. 3. Shake the inhaler for 5 seconds. 4. Turn the inhaler so the bottle is above the mouthpiece. 5. Put your pointer finger on top of the bottle. Your thumb holds the bottom of the inhaler. 6. Open your mouth. 7. Either hold the inhaler away from your mouth (the width of 2 fingers) or place your lips tightly around the mouthpiece. Ask your doctor which way to use your inhaler. 8. Breathe out as much air as possible. 9. Breathe in and push down on the bottle 1 time to release the medicine. You will feel the medicine go in your mouth and throat. 10. Continue to take a deep breath in very slowly. Try to fill your lungs. 11. After you have breathed in completely, hold your breath for 10 seconds. This will help the medicine to settle in your lungs. If you cannot hold your breath for 10 seconds, hold it for as long as you can before you breathe out. 12. Breathe out slowly, through pursed lips. Whistling is an example of pursed lips. 13. If your doctor has told you to take more than 1 puff, wait at least 15-30 seconds between puffs. This will help you get the best results from your medicine. Do not use the inhaler more than your doctor tells you to. 14. Put the cap back on the inhaler. 15. Follow the directions from your doctor or from the inhaler package about cleaning the inhaler. If you use more than one inhaler, ask your doctor which inhalers to use and what order to use them in. Ask your doctor to help you figure out when you will need to refill your  inhaler.  If you use a steroid inhaler, always rinse your mouth with water after your last puff, gargle and spit out the water. Do not swallow the water. GET HELP IF:  The inhaler medicine only partially helps to stop wheezing or shortness of breath.  You are having trouble using your inhaler.  You have some increase in thick spit (phlegm). GET HELP RIGHT AWAY IF:  The inhaler medicine does not help your wheezing or shortness of breath or you have tightness in your chest.  You have dizziness, headaches, or fast heart rate.  You have chills, fever, or night sweats.  You have a large increase of thick spit, or your thick spit is bloody. MAKE SURE YOU:   Understand these instructions.  Will watch your condition.  Will get help right away if you are not doing well or get worse.   This information is not intended to replace advice given to you by your health care provider. Make sure you discuss any questions you have with your health care provider.   Document Released: 11/05/2007 Document Revised: 11/16/2012 Document Reviewed: 08/25/2012 Elsevier Interactive Patient Education [employer]

## 2015-03-25 NOTE — ED Notes (Signed)
Seen by david mabe, np only 

## 2015-04-08 ENCOUNTER — Encounter: Payer: Self-pay | Admitting: Internal Medicine

## 2015-04-08 ENCOUNTER — Ambulatory Visit: Payer: Medicare Other | Attending: Internal Medicine | Admitting: Internal Medicine

## 2015-04-08 VITALS — BP 144/83 | HR 71 | Temp 98.3°F | Resp 18 | Ht 75.0 in | Wt 232.4 lb

## 2015-04-08 DIAGNOSIS — Z131 Encounter for screening for diabetes mellitus: Secondary | ICD-10-CM

## 2015-04-08 LAB — POCT GLYCOSYLATED HEMOGLOBIN (HGB A1C): HEMOGLOBIN A1C: 5.7

## 2015-04-08 NOTE — Addendum Note (Signed)
Addended by: Holland Commons A on: 04/08/2015 03:30 PM   Modules accepted: Orders, Level of Service

## 2015-04-08 NOTE — Progress Notes (Signed)
Patient ID: Timothy Mcmillan, male   DOB: [birth year], 60 y.o.   MRN: [id number] Patient states that he was here to see a Podiatrist and has no need to see a primary care specialist. I have explained to him that he needs to see a doctor in order to get a referral to podiatrist and patient argues that he does not because he has Medicaid and he can see any provider he wants. Patient wishes not to be seen today. Patient only charged today because screening test performed before being evaluated by me.  Ambrose Finland, NP 04/08/2015 3:19 PM

## 2015-04-08 NOTE — Progress Notes (Signed)
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   Patient states he's having pain left leg, described as sharp pain with pressure and stabbing feeling.  Patient has a question about flu shot.

## 2015-04-27 ENCOUNTER — Encounter (HOSPITAL_COMMUNITY): Payer: Self-pay | Admitting: *Deleted

## 2015-04-27 ENCOUNTER — Emergency Department (HOSPITAL_COMMUNITY): Payer: Medicare Other

## 2015-04-27 ENCOUNTER — Emergency Department (HOSPITAL_COMMUNITY)
Admission: EM | Admit: 2015-04-27 | Discharge: 2015-04-28 | Disposition: A | Discharge status: Home / Self Care | Payer: Medicare Other | Attending: Emergency Medicine | Admitting: Emergency Medicine

## 2015-04-27 DIAGNOSIS — S91109A Unspecified open wound of unspecified toe(s) without damage to nail, initial encounter: Secondary | ICD-10-CM

## 2015-04-27 DIAGNOSIS — S91201D Unspecified open wound of right great toe with damage to nail, subsequent encounter: Secondary | ICD-10-CM | POA: Diagnosis not present

## 2015-04-27 DIAGNOSIS — Z79899 Other long term (current) drug therapy: Secondary | ICD-10-CM | POA: Insufficient documentation

## 2015-04-27 DIAGNOSIS — J45901 Unspecified asthma with (acute) exacerbation: Secondary | ICD-10-CM | POA: Diagnosis not present

## 2015-04-27 DIAGNOSIS — F17210 Nicotine dependence, cigarettes, uncomplicated: Secondary | ICD-10-CM | POA: Insufficient documentation

## 2015-04-27 DIAGNOSIS — R001 Bradycardia, unspecified: Secondary | ICD-10-CM | POA: Diagnosis not present

## 2015-04-27 DIAGNOSIS — J209 Acute bronchitis, unspecified: Secondary | ICD-10-CM

## 2015-04-27 DIAGNOSIS — I10 Essential (primary) hypertension: Secondary | ICD-10-CM | POA: Insufficient documentation

## 2015-04-27 DIAGNOSIS — X58XXXD Exposure to other specified factors, subsequent encounter: Secondary | ICD-10-CM | POA: Diagnosis not present

## 2015-04-27 MED ORDER — IPRATROPIUM-ALBUTEROL 0.5-2.5 (3) MG/3ML IN SOLN
3.0000 mL | Freq: Once | RESPIRATORY_TRACT | Status: AC
  Administered 2015-04-27: 3 mL via RESPIRATORY_TRACT
  Filled 2015-04-27: qty 3

## 2015-04-27 MED ORDER — PREDNISONE 20 MG PO TABS
60.0000 mg | ORAL_TABLET | Freq: Once | ORAL | Status: AC
  Administered 2015-04-27: 60 mg via ORAL
  Filled 2015-04-27: qty 3

## 2015-04-27 NOTE — ED Provider Notes (Signed)
CSN: [id number]     Arrival date & time 04/27/15  2005 History   First MD Initiated Contact with Patient 04/27/15 2145     Chief Complaint  Patient presents with  . Foot Pain     (Consider location/radiation/quality/duration/timing/severity/associated sxs/prior Treatment) HPI Comments: 60 year old male with a history of hypertension and cocaine use presents to the emergency department for multiple complaints. Patient states that he is primarily concerned about some shortness of breath which has been progressive over the past 3-4 days. He reports that symptoms are associated with a cough productive of yellow phlegm. He feels a "ripping" sensation in his chest with coughing. Patient reporting radiation of this discomfort from the L side of his neck to his L chest. He does report a history of asthma and states he has been prescribed an albuterol inhaler, but unable to fill it. He has had no fever, nausea, vomiting, hemoptysis, or syncope.   Patient also complaining of right great toe drainage. He reports having his nail excised one month ago. He initially soaked his toe in Epsom salt for 1 week as instructed. Since this time he has been keeping it covered with a Band-Aid and applying Neosporin. He states that he has noted pus straining from the area as well as some tenderness to his right great toe. He is concerned because he had his toenail excised one time previously and the healing did not take this long. He denies any fever associated with these symptoms. No worsening erythema or swelling to the digit. No red linear streaking. Patient states he was unable to follow up with a foot doctor because he could not afford a subsequent visit.  Patient is a 60 y.o. male presenting with lower extremity pain. The history is provided by the patient. No language interpreter was used.  Foot Pain Associated symptoms include arthralgias, coughing and myalgias. Pertinent negatives include no abdominal pain, fever,  numbness, vomiting or weakness.    Past Medical History  Diagnosis Date  . Hypertension    Past Surgical History  Procedure Laterality Date  . Eye surgery    . Hernia repair     Family History  Problem Relation Age of Onset  . Diabetes type II Father   . Stroke Father    Social History  Substance Use Topics  . Smoking status: Current Every Day Smoker -- 0.50 packs/day    Types: Cigarettes  . Smokeless tobacco: None  . Alcohol Use: Yes     Comment: intermittently    Review of Systems  Constitutional: Negative for fever.  Respiratory: Positive for cough and shortness of breath.   Gastrointestinal: Negative for vomiting, abdominal pain and blood in stool.  Musculoskeletal: Positive for myalgias and arthralgias.       +R great toe pain  Skin: Positive for wound (R great toe).  Neurological: Negative for weakness and numbness.  All other systems reviewed and are negative.   Allergies  Mold extract  Home Medications   Prior to Admission medications   Medication Sig Start Date End Date Taking? Authorizing Provider  acetaminophen (TYLENOL) 500 MG tablet Take 1,000 mg by mouth every 2 (two) hours as needed.   Yes Historical Provider, MD  albuterol (PROVENTIL HFA;VENTOLIN HFA) 108 (90 Base) MCG/ACT inhaler Inhale 2 puffs into the lungs every 4 (four) hours as needed for wheezing or shortness of breath. 03/25/15  Yes Hayden Rasmussen, NP  amLODipine (NORVASC) 10 MG tablet Take 10 mg by mouth daily.   Yes Historical Provider,  MD  Chlorphen-Pseudoephed-APAP (THERAFLU FLU/COLD PO) Take 30 mLs by mouth 2 (two) times daily.   Yes Historical Provider, MD  ibuprofen (ADVIL,MOTRIN) 200 MG tablet Take 400 mg by mouth every 6 (six) hours as needed for moderate pain.   Yes Historical Provider, MD  lisinopril (PRINIVIL,ZESTRIL) 20 MG tablet Take 1 tablet (20 mg total) by mouth daily. 11/17/14  Yes Calvert CantorSaima Rizwan, MD  tamsulosin (FLOMAX) 0.4 MG CAPS capsule Take 1 capsule (0.4 mg total) by mouth  daily. 11/17/14  Yes Calvert CantorSaima Rizwan, MD  diclofenac (CATAFLAM) 50 MG tablet Take 1 tablet (50 mg total) by mouth 3 (three) times daily. One tablet TID with food prn pain. Patient not taking: Reported on 04/08/2015 03/25/15   Hayden Rasmussenavid Mabe, NP  ibuprofen (ADVIL,MOTRIN) 600 MG tablet Take 1 tablet (600 mg total) by mouth every 6 (six) hours as needed. Patient not taking: Reported on 02/20/2015 02/20/15   Shon Batonourtney F Horton, MD  moxifloxacin (AVELOX) 400 MG tablet Take 1 tablet (400 mg total) by mouth daily at 8 pm. Patient not taking: Reported on 04/08/2015 03/25/15   Hayden Rasmussenavid Mabe, NP  predniSONE (DELTASONE) 20 MG tablet Take 3 tabs po on first day, 2 tabs second day, 2 tabs third day, 1 tab fourth day, 1 tab 5th day. Take with food. Patient not taking: Reported on 04/08/2015 03/25/15   Hayden Rasmussenavid Mabe, NP   BP 140/84 mmHg  Pulse 62  Temp(Src) 98.3 F (36.8 C) (Oral)  Resp 16  SpO2 94%   Physical Exam  Constitutional: He is oriented to person, place, and time. He appears well-developed and well-nourished. No distress.  Nontoxic appearing.  HENT:  Head: Normocephalic and atraumatic.  Eyes: Conjunctivae and EOM are normal. No scleral icterus.  Neck: Normal range of motion.  Cardiovascular: Normal rate, regular rhythm and intact distal pulses.   Intermittent bradycardia; otherwise NSR DP and PT pulses 2+ in the RLE  Pulmonary/Chest: Effort normal. No respiratory distress. He has no rales.  Chest expansion symmetric. No rales or rhonchi. Mild expiratory wheeze.  Musculoskeletal: Normal range of motion.       Right foot: There is tenderness (TTP to R great toe). There is no swelling, normal capillary refill, no crepitus and no laceration.       Feet:  Neurological: He is alert and oriented to person, place, and time. He exhibits normal muscle tone. Coordination normal.  Sensation to light touch intact. Patient ambulatory with steady gait.  Skin: Skin is warm and dry. No rash noted. He is not diaphoretic. No  erythema. No pallor.  Psychiatric: He has a normal mood and affect. His behavior is normal.  Nursing note and vitals reviewed.   ED Course  Procedures (including critical care time) Labs Review Labs Reviewed - No data to display  Imaging Review Dg Chest 2 View  04/27/2015  CLINICAL DATA:  Sob/coughing x's week. Chest pain EXAM: CHEST  2 VIEW COMPARISON:  02/19/2015 FINDINGS: Heart size is normal. There is mild perihilar peribronchial thickening. There are no focal consolidations. There are small bilateral pleural effusions associated with bibasilar atelectasis. Visualized osseous structures have a normal appearance. IMPRESSION: 1. Bronchitic changes. 2. Small bilateral pleural effusions and bibasilar atelectasis. Electronically Signed   By: Norva PavlovElizabeth  Brown M.D.   On: 04/27/2015 21:52   I have personally reviewed and evaluated these images and lab results as part of my medical decision-making.   EKG Interpretation   Date/Time:  Saturday April 27 2015 22:43:05 EDT Ventricular Rate:  55 PR  Interval:  203 QRS Duration: 89 QT Interval:  414 QTC Calculation: 396 R Axis:   80 Text Interpretation:  Sinus rhythm Probable anteroseptal infarct, old  Confirmed by KOHUT  MD, STEPHEN ([id number]) on 04/28/2015 12:11:55 AM      MDM   Final diagnoses:  Bronchitis with bronchospasm  Open toe wound, initial encounter    60 year old male presents to the emergency department for evaluation of shortness of breath. Symptoms consistent with bronchitis. Patient is afebrile and without tachypnea, dyspnea, or hypoxia. He has had an associated cough productive of yellow phlegm. Symptoms treated with prednisone and a DuoNeb in the emergency department. Patient is able to ambulate without hypoxia after receiving these medications. Chest x-ray negative for pneumonia. EKG does not suggest new ischemic change or STEMI.  Patient also complaining of an open wound to his right great toe where his toenail was removed.  This area does not appear to be acutely infected today. Low concern for cellulitis or abscess. Granulation tissue appears appropriate. Patient, however, is concerned about infection despite reassurance. Have offered to start a course of Bactrim for infection prevention. Wound care discussed.   No indication for further emergent workup at this time. Patient discharged with instruction to follow-up with a primary care doctor as well as his foot doctor for wound recheck. Return precautions discussed and provided. Patient discharged in satisfactory condition with no unaddressed concerns.    Filed Vitals:   04/27/15 2230 04/27/15 2315 04/27/15 2330 04/28/15 0010  BP: 132/97 131/92 132/93 132/89  Pulse: 61 61 64 65  Temp:    98.2 F (36.8 C)  TempSrc:      Resp:  SpO2: 97% 100% 97% 98%     Antony Madura, PA-C 04/28/15 0015  Raeford Razor, MD 05/02/15 [id number]

## 2015-04-27 NOTE — ED Notes (Signed)
The pt wants his foot checked  He had=s had drainage from his rt great toe for 4 weeks

## 2015-04-27 NOTE — ED Notes (Signed)
The pt is c/o  Lt foot great toe he thinks he has infection in it.  He is also c/o sob  Coughing in am  From his stomas=ch

## 2015-04-28 DIAGNOSIS — S91201D Unspecified open wound of right great toe with damage to nail, subsequent encounter: Secondary | ICD-10-CM | POA: Diagnosis not present

## 2015-04-28 MED ORDER — ALBUTEROL SULFATE HFA 108 (90 BASE) MCG/ACT IN AERS
2.0000 | INHALATION_SPRAY | Freq: Once | RESPIRATORY_TRACT | Status: AC
  Administered 2015-04-28: 2 via RESPIRATORY_TRACT
  Filled 2015-04-28: qty 6.7

## 2015-04-28 MED ORDER — BENZONATATE 100 MG PO CAPS: 100.0000 mg | ORAL_CAPSULE | Freq: Three times a day (TID) | ORAL | Status: DC

## 2015-04-28 MED ORDER — PREDNISONE 20 MG PO TABS
40.0000 mg | ORAL_TABLET | Freq: Every day | ORAL | Status: DC
Start: 2015-04-28 — End: 2015-09-13

## 2015-04-28 MED ORDER — SULFAMETHOXAZOLE-TRIMETHOPRIM 800-160 MG PO TABS: 1.0000 | ORAL_TABLET | Freq: Two times a day (BID) | ORAL | Status: AC

## 2015-04-28 NOTE — Discharge Instructions (Signed)
Acute Bronchitis Bronchitis is inflammation of the airways that extend from the windpipe into the lungs (bronchi). The inflammation often causes mucus to develop. This leads to a cough, which is the most common symptom of bronchitis.  In acute bronchitis, the condition usually develops suddenly and goes away over time, usually in a couple weeks. Smoking, allergies, and asthma can make bronchitis worse. Repeated episodes of bronchitis may cause further lung problems.  CAUSES Acute bronchitis is most often caused by the same virus that causes a cold. The virus can spread from person to person (contagious) through coughing, sneezing, and touching contaminated objects. SIGNS AND SYMPTOMS   Cough.   Fever.   Coughing up mucus.   Body aches.   Chest congestion.   Chills.   Shortness of breath.   Sore throat.  DIAGNOSIS  Acute bronchitis is usually diagnosed through a physical exam. Your health care provider will also ask you questions about your medical history. Tests, such as chest X-rays, are sometimes done to rule out other conditions.  TREATMENT  Acute bronchitis usually goes away in a couple weeks. Oftentimes, no medical treatment is necessary. Medicines are sometimes given for relief of fever or cough. Antibiotic medicines are usually not needed but may be prescribed in certain situations. In some cases, an inhaler may be recommended to help reduce shortness of breath and control the cough. A cool mist vaporizer may also be used to help thin bronchial secretions and make it easier to clear the chest.  HOME CARE INSTRUCTIONS  Get plenty of rest.   Drink enough fluids to keep your urine clear or pale yellow (unless you have a medical condition that requires fluid restriction). Increasing fluids may help thin your respiratory secretions (sputum) and reduce chest congestion, and it will prevent dehydration.   Take medicines only as directed by your health care provider.  If  you were prescribed an antibiotic medicine, finish it all even if you start to feel better.  Avoid smoking and secondhand smoke. Exposure to cigarette smoke or irritating chemicals will make bronchitis worse. If you are a smoker, consider using nicotine gum or skin patches to help control withdrawal symptoms. Quitting smoking will help your lungs heal faster.   Reduce the chances of another bout of acute bronchitis by washing your hands frequently, avoiding people with cold symptoms, and trying not to touch your hands to your mouth, nose, or eyes.   Keep all follow-up visits as directed by your health care provider.  SEEK MEDICAL CARE IF: Your symptoms do not improve after 1 week of treatment.  SEEK IMMEDIATE MEDICAL CARE IF:  You develop an increased fever or chills.   You have chest pain.   You have severe shortness of breath.  You have bloody sputum.   You develop dehydration.  You faint or repeatedly feel like you are going to pass out.  You develop repeated vomiting.  You develop a severe headache. MAKE SURE YOU:   Understand these instructions.  Will watch your condition.  Will get help right away if you are not doing well or get worse.   This information is not intended to replace advice given to you by your health care provider. Make sure you discuss any questions you have with your health care provider.   Document Released: 03/05/2004 Document Revised: 02/16/2014 Document Reviewed: 07/19/2012 Elsevier Interactive Patient Education 2016 Elsevier Inc.  Dressing Change A dressing is a material placed over wounds. It keeps the wound clean, dry, and protected  from further injury. This provides an environment that favors wound healing.  BEFORE YOU BEGIN  Get your supplies together. Things you may need include:  Saline solution.  Flexible gauze dressing.  Medicated cream.  Tape.  Gloves.  Abdominal dressing pads.  Gauze squares.  Plastic  bags.  Take pain medicine 30 minutes before the dressing change if you need it.  Take a shower before you do the first dressing change of the day. Use plastic wrap or a plastic bag to prevent the dressing from getting wet. REMOVING YOUR OLD DRESSING   Wash your hands with soap and water. Dry your hands with a clean towel.  Put on your gloves.  Remove any tape.  Carefully remove the old dressing. If the dressing sticks, you may dampen it with warm water to loosen it, or follow your caregiver's specific directions.  Remove any gauze or packing tape that is in your wound.  Take off your gloves.  Put the gloves, tape, gauze, or any packing tape into a plastic bag. CHANGING YOUR DRESSING  Open the supplies.  Take the cap off the saline solution.  Open the gauze package so that the gauze remains on the inside of the package.  Put on your gloves.  Clean your wound as told by your caregiver.  If you have been told to keep your wound dry, follow those instructions.  Your caregiver may tell you to do one or more of the following:  Pick up the gauze. Pour the saline solution over the gauze. Squeeze out the extra saline solution.  Put medicated cream or other medicine on your wound if you have been told to do so.  Put the solution soaked gauze only in your wound, not on the skin around it.  Pack your wound loosely or as told by your caregiver.  Put dry gauze on your wound.  Put abdominal dressing pads over the dry gauze if your wet gauze soaks through.  Tape the abdominal dressing pads in place so they will not fall off. Do not wrap the tape completely around the affected part (arm, leg, abdomen).  Wrap the dressing pads with a flexible gauze dressing to secure it in place.  Take off your gloves. Put them in the plastic bag with the old dressing. Tie the bag shut and throw it away.  Keep the dressing clean and dry until your next dressing change.  Wash your hands. SEEK  MEDICAL CARE IF:  Your skin around the wound looks red.  Your wound feels more tender or sore.  You see pus in the wound.  Your wound smells bad.  You have a fever.  Your skin around the wound has a rash that itches and burns.  You see black or yellow skin in your wound that was not there before.  You feel nauseous, throw up, and feel very tired.   This information is not intended to replace advice given to you by your health care provider. Make sure you discuss any questions you have with your health care provider.   Document Released: 03/05/2004 Document Revised: 04/20/2011 Document Reviewed: 12/08/2010 Elsevier Interactive Patient Education [employer]2016 Elsevier Inc.

## 2015-09-13 ENCOUNTER — Emergency Department (INDEPENDENT_AMBULATORY_CARE_PROVIDER_SITE_OTHER)
Admission: EM | Admit: 2015-09-13 | Discharge: 2015-09-13 | Disposition: A | Discharge status: Home / Self Care | Payer: Medicare Other | Source: Home / Self Care | Attending: Emergency Medicine | Admitting: Emergency Medicine

## 2015-09-13 ENCOUNTER — Encounter: Payer: Self-pay | Admitting: *Deleted

## 2015-09-13 DIAGNOSIS — B001 Herpesviral vesicular dermatitis: Secondary | ICD-10-CM | POA: Diagnosis not present

## 2015-09-13 DIAGNOSIS — J029 Acute pharyngitis, unspecified: Secondary | ICD-10-CM | POA: Diagnosis not present

## 2015-09-13 LAB — POCT RAPID STREP A (OFFICE): Rapid Strep A Screen: NEGATIVE

## 2015-09-13 MED ORDER — ACYCLOVIR 400 MG PO TABS: 400.0000 mg | ORAL_TABLET | Freq: Every day | ORAL | 0 refills | Status: AC

## 2015-09-13 MED ORDER — ACYCLOVIR 400 MG PO TABS: 400.0000 mg | ORAL_TABLET | Freq: Every day | ORAL | 0 refills | Status: DC

## 2015-09-13 MED ORDER — ACYCLOVIR 5 % EX OINT: TOPICAL_OINTMENT | CUTANEOUS | 0 refills | Status: DC

## 2015-09-13 NOTE — ED Provider Notes (Signed)
[facility]    CSN: [id number] Arrival date & time: 09/13/15  1714  First Provider Contact:  First MD Initiated Contact with Patient 09/13/15 1739     Patient presents to [facility] on Friday 5:14 PM   History   Chief Complaint   Patient presents with  . Sore Throat     . Mouth Lesions  HPI Timothy Mcmillan is a 60 y.o. male.   HPI 3 days of mild sore throat with progressive painful cold sores right lower lip. He feels he has a cold sore in the left gum but denies any dental or other oral symptoms. Has tried topical OTC cream without relief. He states that in the past, prescription for Zovirax pills and Zovirax topical helped clear acute cold sores and he requests that treatment. He denies cold sores or skin lesions anywhere else. Denies fever or chills or nausea or vomiting. No ear pain or coryza or neck pain. No chest pain or shortness of breath. Denies any history of chronic immunosuppression He denies any current usage of alcohol or any illegal drugs. He states he recently moved to [place] from [place] and does not have a PCP. Past Medical History:  Diagnosis Date  . Hypertension    Reviewed past medical history: Patient Active Problem List   Diagnosis Date Noted  . Cocaine abuse   . Alcohol abuse   . Acute renal failure (HCC)   . Hypokalemia 11/14/2014  . Polysubstance abuse 11/14/2014  . Alcohol withdrawal delirium (HCC) 11/14/2014  . CAP (community acquired pneumonia) 11/14/2014  . Alcohol withdrawal (HCC) 11/14/2014  . Acute renal failure (ARF) (HCC) 11/14/2014  . Acute encephalopathy     Past Surgical History:  Procedure Laterality Date  . EYE SURGERY    . HERNIA REPAIR         Home Medications    Prior to Admission medications   Medication Sig Start Date End Date Taking? Authorizing Provider  amLODipine (NORVASC) 10 MG tablet Take 10 mg by mouth daily.   Yes Historical Provider, MD  lisinopril  (PRINIVIL,ZESTRIL) 20 MG tablet Take 1 tablet (20 mg total) by mouth daily. 11/17/14  Yes Calvert Cantor, MD  tamsulosin (FLOMAX) 0.4 MG CAPS capsule Take 1 capsule (0.4 mg total) by mouth daily. 11/17/14  Yes Calvert Cantor, MD  acyclovir (ZOVIRAX) 400 MG tablet Take 1 tablet (400 mg total) by mouth 5 (five) times daily. For 5 days 09/13/15   Lajean Manes, MD  acyclovir ointment (ZOVIRAX) 5 % 1 application topically to cold sores, 5 times a day 09/13/15   Lajean Manes, MD    Family History Family History  Problem Relation Age of Onset  . Diabetes type II Father   . Stroke Father     Social History Social History  Substance Use Topics  . Smoking status: Current Every Day Smoker    Packs/day: 0.50    Types: Cigarettes  . Smokeless tobacco: Never Used  . Alcohol use Yes     Comment: intermittently     Allergies   Mold extract [trichophyton]   Review of Systems Review of Systems  All other systems reviewed and are negative.  he mentions that yesterday, he had some vague floaters in his right eye, but that resolved. No current eye complaints. No drainage or discharge from eyes   Physical Exam Triage Vital Signs ED Triage Vitals [09/13/15 1738]  Enc Vitals Group     BP 138/80     Pulse Rate Marland Kitchen)  59     Resp      Temp 98.2 F (36.8 C)     Temp Source Oral     SpO2 99 %     Weight 227 lb (103 kg)     Height  (1.905 m)     Head Circumference      Peak Flow      Pain Score      Pain Loc      Pain Edu?      Excl. in GC?    No data found.   Updated Vital Signs BP 138/80 (BP Location: Left Arm)   Pulse (!) 59   Temp 98.2 F (36.8 C) (Oral)   Ht  (1.905 m)   Wt 227 lb (103 kg)   SpO2 99%   BMI 28.37 kg/m   Visual Acuity Right Eye Distance:   Left Eye Distance:   Bilateral Distance:    Right Eye Near:   Left Eye Near:    Bilateral Near:     Physical Exam  Constitutional: He is oriented to person, place, and time. He appears well-developed and  well-nourished. No distress.  HENT:  Head: Normocephalic and atraumatic. Head is without right periorbital erythema and without left periorbital erythema.  Right Ear: External ear normal.  Left Ear: External ear normal.  Nose: Nose normal.  Mouth/Throat: Posterior oropharyngeal erythema (Mild) present. No tonsillar exudate.    Eyes: Conjunctivae are normal. Pupils are equal, round, and reactive to light. No scleral icterus.  Neck: Normal range of motion. Neck supple.  Cardiovascular: Normal rate and regular rhythm.   Pulmonary/Chest: Effort normal.  Abdominal: He exhibits no distension.  Neurological: He is alert and oriented to person, place, and time.  Skin: Skin is warm and dry.  Psychiatric: He has a normal mood and affect. His behavior is normal.     UC Treatments / Results  Labs (all labs ordered are listed, but only abnormal results are displayed) Labs Reviewed  POCT RAPID STREP A (OFFICE)    EKG  EKG Interpretation None       Radiology No results found.  Procedures Procedures (including critical care time)  Medications Ordered in UC Medications - No data to display   Initial Impression / Assessment and Plan / UC Course  I have reviewed the triage vital signs and the nursing notes.  Pertinent labs & imaging results that were available during my care of the patient were reviewed by me and considered in my medical decision making (see chart for details).  Clinical Course  Rapid strep test negative. My opinion, he has no indication to treat with an antibiotic at this time.  Treatment options discussed, as well as risks, benefits, alternatives. Patient voiced understanding and agreement with the following plans: Zovirax 400 mg by mouth 5 times a day for 5 days. #25, no refills Zovirax ointment, apply topically 5 times a day. #30 g, no refills Other symptomatic care discussed Advised to establish with, and Follow-up with your primary care doctor in 5-7 days  if not improving, or sooner if symptoms become worse. Precautions discussed. Red flags discussed.--Emergency room if any red flag Questions invited and answered. Patient voiced understanding and agreement.  Although he does not currently have any eye complaints, I advised him to make an appointment to see an eye doctor within the next week.  Final Clinical Impressions(s) / UC Diagnoses   Final diagnoses:  Cold sore  Sore throat  Lajean Manes, MD 09/13/15 [phone]

## 2015-09-13 NOTE — ED Triage Notes (Signed)
Pt c/o 2-3 days of sore that and cold sores around his lips. Used otc ointment on his lips. Yesterday started experiencing "flashing lights on the right side of his eye and floaters" No drainage from the eye. Afebrile.

## 2015-10-05 ENCOUNTER — Encounter (HOSPITAL_COMMUNITY): Payer: Self-pay | Admitting: Emergency Medicine

## 2015-10-05 ENCOUNTER — Emergency Department (HOSPITAL_COMMUNITY): Payer: Medicare Other

## 2015-10-05 ENCOUNTER — Emergency Department (HOSPITAL_COMMUNITY)
Admission: EM | Admit: 2015-10-05 | Discharge: 2015-10-05 | Disposition: A | Discharge status: Home / Self Care | Payer: Medicare Other | Attending: Emergency Medicine | Admitting: Emergency Medicine

## 2015-10-05 DIAGNOSIS — F1010 Alcohol abuse, uncomplicated: Secondary | ICD-10-CM | POA: Diagnosis not present

## 2015-10-05 DIAGNOSIS — R0789 Other chest pain: Secondary | ICD-10-CM | POA: Diagnosis not present

## 2015-10-05 DIAGNOSIS — F17210 Nicotine dependence, cigarettes, uncomplicated: Secondary | ICD-10-CM | POA: Insufficient documentation

## 2015-10-05 DIAGNOSIS — Z79899 Other long term (current) drug therapy: Secondary | ICD-10-CM | POA: Diagnosis not present

## 2015-10-05 DIAGNOSIS — F1410 Cocaine abuse, uncomplicated: Secondary | ICD-10-CM | POA: Diagnosis not present

## 2015-10-05 DIAGNOSIS — F1910 Other psychoactive substance abuse, uncomplicated: Secondary | ICD-10-CM

## 2015-10-05 LAB — BASIC METABOLIC PANEL
Anion gap: 7 (ref 5–15)
BUN: 14 mg/dL (ref 6–20)
CALCIUM: 9 mg/dL (ref 8.9–10.3)
CO2: 25 mmol/L (ref 22–32)
CREATININE: 1.66 mg/dL — AB (ref 0.61–1.24)
Chloride: 110 mmol/L (ref 101–111)
GFR, EST AFRICAN AMERICAN: 50 mL/min — AB (ref >60)
GFR, EST NON AFRICAN AMERICAN: 43 mL/min — AB (ref >60)
Glucose, Bld: 123 mg/dL — ABNORMAL HIGH (ref 65–99)
Potassium: 3.1 mmol/L — ABNORMAL LOW (ref 3.5–5.1)
SODIUM: 142 mmol/L (ref 135–145)

## 2015-10-05 LAB — CBC
HCT: 49.8 % (ref 39.0–52.0)
Hemoglobin: 17 g/dL (ref 13.0–17.0)
MCH: 32.3 pg (ref 26.0–34.0)
MCHC: 34.1 g/dL (ref 30.0–36.0)
MCV: 94.5 fL (ref 78.0–100.0)
PLATELETS: 239 10*3/uL (ref 150–400)
RBC: 5.27 MIL/uL (ref 4.22–5.81)
RDW: 13.5 % (ref 11.5–15.5)
WBC: 9.3 10*3/uL (ref 4.0–10.5)

## 2015-10-05 LAB — ETHANOL

## 2015-10-05 LAB — I-STAT TROPONIN, ED
TROPONIN I, POC: 0 ng/mL (ref 0.00–0.08)
Troponin i, poc: 0 ng/mL (ref 0.00–0.08)

## 2015-10-05 LAB — MAGNESIUM: MAGNESIUM: 2.5 mg/dL — AB (ref 1.7–2.4)

## 2015-10-05 LAB — D-DIMER, QUANTITATIVE (NOT AT ARMC): D DIMER QUANT: 0.3 ug{FEU}/mL (ref 0.00–0.50)

## 2015-10-05 MED ORDER — ASPIRIN 81 MG PO CHEW: 324.0000 mg | CHEWABLE_TABLET | Freq: Once | ORAL | Status: DC

## 2015-10-05 MED ORDER — SODIUM CHLORIDE 0.9 % IV BOLUS (SEPSIS)
1000.0000 mL | Freq: Once | INTRAVENOUS | Status: AC
  Administered 2015-10-05: 1000 mL via INTRAVENOUS

## 2015-10-05 MED ORDER — METHOCARBAMOL 500 MG PO TABS: 500.0000 mg | ORAL_TABLET | Freq: Once | ORAL | Status: DC

## 2015-10-05 NOTE — Discharge Instructions (Signed)
Do not hesitate to return to the emergency room for any new, worsening or concerning symptoms. ° °Please obtain primary care using resource guide below. Let them know that you were seen in the emergency room and that they will need to obtain records for further outpatient management. ° ° °

## 2015-10-05 NOTE — ED Notes (Signed)
Pt says he drove hear. Advised to wait in waiting area until more alert to drive.

## 2015-10-05 NOTE — ED Provider Notes (Signed)
[facility]-EMER[facility]Y DEPT Provider Note   CSN: [id number] Arrival date & time: 10/05/15  1321     History   Chief Complaint Chief Complaint  Patient presents with  . Chest Pain  . Shortness of Breath    HPI  Blood pressure 109/71, pulse 67, temperature 97.7 F (36.5 C), temperature source Oral, resp. rate 20, SpO2 95 %.  Timothy Mcmillan is a 60 y.o. male with past medical history significant for hypertension, cocaine and alcohol abuse, tobacco use, complaining of SOB and right-sided sharp chest pain onset yesterday not associated with cough, fever. States that it is pleuritic and exacerbated by movement and palpation. Pain is 8 out of 10, been constant since yesterday, started when he was drinking heavily and using cocaine. No diaphoresis, lightheaded sensation. Patient doesn't have a history of ACS, does not follow with cardiology, does not take a daily aspirin, no pain medication taken prior to arrival, no calf pain or leg swelling, no recent immobilizations or history of cancer.  HPI  Past Medical History:  Diagnosis Date  . Hypertension     Patient Active Problem List   Diagnosis Date Noted  . Cocaine abuse   . Alcohol abuse   . Acute renal failure (HCC)   . Hypokalemia 11/14/2014  . Polysubstance abuse 11/14/2014  . Alcohol withdrawal delirium (HCC) 11/14/2014  . CAP (community acquired pneumonia) 11/14/2014  . Alcohol withdrawal (HCC) 11/14/2014  . Acute renal failure (ARF) (HCC) 11/14/2014  . Acute encephalopathy     Past Surgical History:  Procedure Laterality Date  . EYE SURGERY    . HERNIA REPAIR         Home Medications    Prior to Admission medications   Medication Sig Start Date End Date Taking? Authorizing Provider  acyclovir ointment (ZOVIRAX) 5 % Apply to cold sore 5 times a day 09/13/15   Lajean Manes, MD  amLODipine (NORVASC) 10 MG tablet Take 10 mg by mouth daily.    Historical Provider, MD  lisinopril (PRINIVIL,ZESTRIL) 20 MG tablet Take 1  tablet (20 mg total) by mouth daily. 11/17/14   Calvert Cantor, MD  tamsulosin (FLOMAX) 0.4 MG CAPS capsule Take 1 capsule (0.4 mg total) by mouth daily. 11/17/14   Calvert Cantor, MD    Family History Family History  Problem Relation Age of Onset  . Diabetes type II Father   . Stroke Father     Social History Social History  Substance Use Topics  . Smoking status: Current Every Day Smoker    Packs/day: 1.00    Types: Cigarettes  . Smokeless tobacco: Never Used  . Alcohol use Yes     Comment: intermittently     Allergies   Mold extract [trichophyton]   Review of Systems Review of Systems  10 systems reviewed and found to be negative, except as noted in the HPI.   Physical Exam Updated Vital Signs BP 120/82   Pulse 62   Temp 97.7 F (36.5 C) (Oral)   Resp 14   SpO2 96%   Physical Exam  Constitutional: He is oriented to person, place, and time. He appears well-developed and well-nourished. No distress.  Somnolent but arousable to voice, slurring speech  HENT:  Head: Normocephalic.  Mouth/Throat: Oropharynx is clear and moist.  Eyes: Conjunctivae are normal.  Neck: Normal range of motion. No JVD present. No tracheal deviation present.  Cardiovascular: Normal rate, regular rhythm and intact distal pulses.   Radial pulse equal bilaterally  Pulmonary/Chest: Effort normal and breath sounds  normal. No stridor. No respiratory distress. He has no wheezes. He has no rales. He exhibits tenderness.    Chest pain reproducible to palpation as diagrammed, no rash  Abdominal: Soft. He exhibits no distension and no mass. There is no tenderness. There is no rebound and no guarding.  Musculoskeletal: Normal range of motion. He exhibits no edema or tenderness.  No calf asymmetry, superficial collaterals, palpable cords, edema, Homans sign negative bilaterally.    Neurological: He is alert and oriented to person, place, and time.  Skin: Skin is warm. He is not diaphoretic.    Psychiatric: He has a normal mood and affect.  Nursing note and vitals reviewed.    ED Treatments / Results  Labs (all labs ordered are listed, but only abnormal results are displayed) Labs Reviewed  BASIC METABOLIC PANEL - Abnormal; Notable for the following:       Result Value   Potassium 3.1 (*)    Glucose, Bld 123 (*)    Creatinine, Ser 1.66 (*)    GFR calc non Af Amer 43 (*)    GFR calc Af Amer 50 (*)    All other components within normal limits  MAGNESIUM - Abnormal; Notable for the following:    Magnesium 2.5 (*)    All other components within normal limits  CBC  ETHANOL  D-DIMER, QUANTITATIVE (NOT AT [facility])  URINE RAPID DRUG SCREEN, HOSP PERFORMED  I-STAT TROPOININ, ED  I-STAT TROPOININ, ED    EKG  EKG Interpretation  Date/Time:  Saturday October 05 2015 13:24:19 EDT Ventricular Rate:  68 PR Interval:  142 QRS Duration: 82 QT Interval:  394 QTC Calculation: 418 R Axis:   81 Text Interpretation:  Normal sinus rhythm nonspecific inferior ST depression. Similar to 2016.  Confirmed by Donnald Garre, MD, Lebron Conners [phone]) on 10/05/2015 5:12:20 PM       Radiology Dg Chest 2 View  Result Date: 10/05/2015 CLINICAL DATA:  Chest pain and shortness of breath for 12 hours EXAM: CHEST  2 VIEW COMPARISON:  04/27/2015 FINDINGS: The heart size and mediastinal contours are within normal limits. Both lungs are clear. The visualized skeletal structures are unremarkable. IMPRESSION: No active cardiopulmonary disease. Electronically Signed   By: Judie Petit.  Shick M.D.   On: 10/05/2015 13:59    Procedures Procedures (including critical care time)  Medications Ordered in ED Medications  methocarbamol (ROBAXIN) tablet 500 mg (0 mg Oral Hold 10/05/15 1809)  aspirin chewable tablet 324 mg (0 mg Oral Hold 10/05/15 1810)  sodium chloride 0.9 % bolus 1,000 mL (1,000 mLs Intravenous New Bag/Given 10/05/15 1807)     Initial Impression / Assessment and Plan / ED Course  I have reviewed the triage  vital signs and the nursing notes.  Pertinent labs & imaging results that were available during my care of the patient were reviewed by me and considered in my medical decision making (see chart for details).  Clinical Course    Vitals:   10/05/15 1945 10/05/15 2000 10/05/15 2012 10/05/15 2045  BP: 108/73 110/71  120/82  Pulse: 61 60  62  Resp: 16 16  14   Temp:      TempSrc:      SpO2: 98% 98% 97% 96%    Medications  methocarbamol (ROBAXIN) tablet 500 mg (0 mg Oral Hold 10/05/15 1809)  aspirin chewable tablet 324 mg (0 mg Oral Hold 10/05/15 1810)  sodium chloride 0.9 % bolus 1,000 mL (1,000 mLs Intravenous New Bag/Given 10/05/15 1807)    Loralee Pacas is  60 y.o. male presenting with chest pain shortness of breath onset yesterday. Pain is reproducible to palpation, on the right side.   EKG nonischemic, troponin negative 2. Dimer negative, chest x-ray negative. I think this is likely a chest wall musculoskeletal pain. Patient's vital signs have remained stable in the ED.  Evaluation does not show pathology that would require ongoing emergent intervention or inpatient treatment. Pt is hemodynamically stable and mentating appropriately. Discussed findings and plan with patient/guardian, who agrees with care plan. All questions answered. Return precautions discussed and outpatient follow up given.     Final Clinical Impressions(s) / ED Diagnoses   Final diagnoses:  Polysubstance abuse  Chest wall pain      Wynetta Emeryicole Hallie Ertl, PA-C 10/05/15 2105    Arby BarretteMarcy Pfeiffer, MD 10/05/15 2304

## 2015-10-05 NOTE — ED Triage Notes (Signed)
Pt here with CP and SOB starting last night. Pt denies radiating or any other symptoms. Hx HTN.

## 2015-10-05 NOTE — ED Notes (Signed)
Pt continues to fall asleep-- asleep with IV stick, Holding PO meds at this time due to lethargy.

## 2015-10-05 NOTE — ED Notes (Signed)
Pt sleeping soundly, but wakes up easily-- when asked why he is here-- he said "for my behavior-- my drinking. Oh-- and I have had chest pain for 3 days" admitsd to smoking crack last night.

## 2015-10-06 ENCOUNTER — Encounter (HOSPITAL_COMMUNITY): Payer: Self-pay | Admitting: Vascular Surgery

## 2015-10-06 ENCOUNTER — Emergency Department (HOSPITAL_COMMUNITY)
Admission: EM | Admit: 2015-10-06 | Discharge: 2015-10-07 | Disposition: A | Discharge status: Home / Self Care | Payer: Medicare Other | Attending: Physician Assistant | Admitting: Physician Assistant

## 2015-10-06 DIAGNOSIS — R45851 Suicidal ideations: Secondary | ICD-10-CM | POA: Diagnosis not present

## 2015-10-06 DIAGNOSIS — F17210 Nicotine dependence, cigarettes, uncomplicated: Secondary | ICD-10-CM | POA: Insufficient documentation

## 2015-10-06 DIAGNOSIS — Z79899 Other long term (current) drug therapy: Secondary | ICD-10-CM | POA: Diagnosis not present

## 2015-10-06 DIAGNOSIS — F32A Depression, unspecified: Secondary | ICD-10-CM

## 2015-10-06 DIAGNOSIS — F1994 Other psychoactive substance use, unspecified with psychoactive substance-induced mood disorder: Secondary | ICD-10-CM

## 2015-10-06 DIAGNOSIS — F329 Major depressive disorder, single episode, unspecified: Secondary | ICD-10-CM | POA: Insufficient documentation

## 2015-10-06 DIAGNOSIS — F1910 Other psychoactive substance abuse, uncomplicated: Secondary | ICD-10-CM

## 2015-10-06 DIAGNOSIS — I10 Essential (primary) hypertension: Secondary | ICD-10-CM | POA: Insufficient documentation

## 2015-10-06 LAB — COMPREHENSIVE METABOLIC PANEL
ALT: 10 U/L — ABNORMAL LOW (ref 17–63)
ANION GAP: 6 (ref 5–15)
AST: 15 U/L (ref 15–41)
Albumin: 3.9 g/dL (ref 3.5–5.0)
Alkaline Phosphatase: 60 U/L (ref 38–126)
BUN: 16 mg/dL (ref 6–20)
CHLORIDE: 112 mmol/L — AB (ref 101–111)
CO2: 23 mmol/L (ref 22–32)
Calcium: 8.6 mg/dL — ABNORMAL LOW (ref 8.9–10.3)
Creatinine, Ser: 1.4 mg/dL — ABNORMAL HIGH (ref 0.61–1.24)
GFR, EST NON AFRICAN AMERICAN: 53 mL/min — AB (ref >60)
Glucose, Bld: 114 mg/dL — ABNORMAL HIGH (ref 65–99)
POTASSIUM: 3.3 mmol/L — AB (ref 3.5–5.1)
Sodium: 141 mmol/L (ref 135–145)
Total Bilirubin: 0.3 mg/dL (ref 0.3–1.2)
Total Protein: 6.6 g/dL (ref 6.5–8.1)

## 2015-10-06 LAB — HEPATIC FUNCTION PANEL
ALBUMIN: 3.9 g/dL (ref 3.5–5.0)
ALK PHOS: 58 U/L (ref 38–126)
ALT: 11 U/L — ABNORMAL LOW (ref 17–63)
AST: 17 U/L (ref 15–41)
BILIRUBIN DIRECT: 0.1 mg/dL (ref 0.1–0.5)
BILIRUBIN TOTAL: 0.5 mg/dL (ref 0.3–1.2)
Indirect Bilirubin: 0.4 mg/dL (ref 0.3–0.9)
TOTAL PROTEIN: 6.8 g/dL (ref 6.5–8.1)

## 2015-10-06 LAB — RAPID URINE DRUG SCREEN, HOSP PERFORMED
Amphetamines: NOT DETECTED
Barbiturates: NOT DETECTED
Benzodiazepines: NOT DETECTED
Cocaine: POSITIVE — AB
OPIATES: NOT DETECTED
TETRAHYDROCANNABINOL: NOT DETECTED

## 2015-10-06 LAB — SALICYLATE LEVEL

## 2015-10-06 LAB — CBC
HCT: 50.4 % (ref 39.0–52.0)
Hemoglobin: 17 g/dL (ref 13.0–17.0)
MCH: 32.4 pg (ref 26.0–34.0)
MCHC: 33.7 g/dL (ref 30.0–36.0)
MCV: 96 fL (ref 78.0–100.0)
PLATELETS: 224 10*3/uL (ref 150–400)
RBC: 5.25 MIL/uL (ref 4.22–5.81)
RDW: 13.9 % (ref 11.5–15.5)
WBC: 8.3 10*3/uL (ref 4.0–10.5)

## 2015-10-06 LAB — ETHANOL

## 2015-10-06 LAB — ACETAMINOPHEN LEVEL: Acetaminophen (Tylenol), Serum: <10 ug/mL — ABNORMAL LOW (ref 10–30)

## 2015-10-06 MED ORDER — POTASSIUM CHLORIDE CRYS ER 20 MEQ PO TBCR
20.0000 meq | EXTENDED_RELEASE_TABLET | Freq: Once | ORAL | Status: AC
  Administered 2015-10-06: 20 meq via ORAL
  Filled 2015-10-06: qty 1

## 2015-10-06 MED ORDER — ONDANSETRON HCL 4 MG PO TABS: 4.0000 mg | ORAL_TABLET | Freq: Three times a day (TID) | ORAL | Status: DC | PRN

## 2015-10-06 MED ORDER — THIAMINE HCL 100 MG/ML IJ SOLN: 100.0000 mg | Freq: Every day | INTRAMUSCULAR | Status: DC

## 2015-10-06 MED ORDER — ALUM & MAG HYDROXIDE-SIMETH 200-200-20 MG/5ML PO SUSP: 30.0000 mL | ORAL | Status: DC | PRN

## 2015-10-06 MED ORDER — LORAZEPAM 1 MG PO TABS: 0.0000 mg | ORAL_TABLET | Freq: Two times a day (BID) | ORAL | Status: DC

## 2015-10-06 MED ORDER — ACETAMINOPHEN 325 MG PO TABS: 650.0000 mg | ORAL_TABLET | ORAL | Status: DC | PRN

## 2015-10-06 MED ORDER — LORAZEPAM 1 MG PO TABS: 1.0000 mg | ORAL_TABLET | Freq: Three times a day (TID) | ORAL | Status: DC | PRN

## 2015-10-06 MED ORDER — VITAMIN B-1 100 MG PO TABS
100.0000 mg | ORAL_TABLET | Freq: Every day | ORAL | Status: DC
  Administered 2015-10-06: 100 mg via ORAL
  Filled 2015-10-06: qty 1

## 2015-10-06 MED ORDER — IBUPROFEN 200 MG PO TABS
600.0000 mg | ORAL_TABLET | Freq: Three times a day (TID) | ORAL | Status: DC | PRN
  Administered 2015-10-06: 600 mg via ORAL
  Filled 2015-10-06: qty 3

## 2015-10-06 MED ORDER — LORAZEPAM 1 MG PO TABS
0.0000 mg | ORAL_TABLET | Freq: Four times a day (QID) | ORAL | Status: DC
Start: 2015-10-06 — End: 2015-10-07

## 2015-10-06 MED ORDER — NICOTINE 21 MG/24HR TD PT24
21.0000 mg | MEDICATED_PATCH | Freq: Every day | TRANSDERMAL | Status: DC
  Administered 2015-10-06: 21 mg via TRANSDERMAL
  Filled 2015-10-06: qty 1

## 2015-10-06 NOTE — Progress Notes (Signed)
Disposition CSW completed patient referrals to the following inpatient psych facilities:  [facility]  CSW will continue to follow patient for placement needs.  Seward SpeckLeo Netty Sullivant [facility] CenterCSW,LCAS Behavioral Health Disposition CSW [phone]

## 2015-10-06 NOTE — [facility] Counselor (Signed)
Attempted to contact nurse in regard to Pt's request re: placement.  1)  Recommendation is that given Pt's stated suicidal ideation, if he attempts to leave, hospital staff should petition for IVC -- advised staff by phone 2)  Social work will continue to work on placement, but no guarantee of placement locally

## 2015-10-06 NOTE — ED Notes (Signed)
TTS in progress 

## 2015-10-06 NOTE — ED Notes (Signed)
Ordered dinner tray for patient.

## 2015-10-06 NOTE — ED Provider Notes (Signed)
[facility] Provider Note   CSN: [id number] Arrival date & time: 10/06/15  0135     History   Chief Complaint Chief Complaint  Patient presents with  . Suicidal    HPI Timothy Mcmillan is a 60 y.o. male.  HPI   Patient presents with suicidal ideation.  States he has been suicidal for a long time, only recently has become "more real" and "closer to doing it."  States he got in his car tonight and thought about driving head on into a car.  Denies homicidal ideation.  He currently physically feels well.  He smokes cigarettes, drinks alcohol and does drugs - denies any withdrawal symptoms.   Past Medical History:  Diagnosis Date  . Hypertension     Patient Active Problem List   Diagnosis Date Noted  . Cocaine abuse   . Alcohol abuse   . Acute renal failure (HCC)   . Hypokalemia 11/14/2014  . Polysubstance abuse 11/14/2014  . Alcohol withdrawal delirium (HCC) 11/14/2014  . CAP (community acquired pneumonia) 11/14/2014  . Alcohol withdrawal (HCC) 11/14/2014  . Acute renal failure (ARF) (HCC) 11/14/2014  . Acute encephalopathy     Past Surgical History:  Procedure Laterality Date  . EYE SURGERY    . HERNIA REPAIR         Home Medications    Prior to Admission medications   Medication Sig Start Date End Date Taking? Authorizing Provider  acyclovir ointment (ZOVIRAX) 5 % Apply to cold sore 5 times a day 09/13/15   Lajean Manes, MD  amLODipine (NORVASC) 10 MG tablet Take 10 mg by mouth daily.    Historical Provider, MD  lisinopril (PRINIVIL,ZESTRIL) 20 MG tablet Take 1 tablet (20 mg total) by mouth daily. 11/17/14   Calvert Cantor, MD  tamsulosin (FLOMAX) 0.4 MG CAPS capsule Take 1 capsule (0.4 mg total) by mouth daily. 11/17/14   Calvert Cantor, MD    Family History Family History  Problem Relation Age of Onset  . Diabetes type II Father   . Stroke Father     Social History Social History  Substance Use Topics  . Smoking status: Current Every Day  Smoker    Packs/day: 1.00    Types: Cigarettes  . Smokeless tobacco: Never Used  . Alcohol use Yes     Comment: intermittently     Allergies   Mold extract [trichophyton]   Review of Systems Review of Systems  All other systems reviewed and are negative.    Physical Exam Updated Vital Signs BP 126/89 (BP Location: Right Arm)   Pulse (!) 58   Temp 97.6 F (36.4 C) (Oral)   Resp 16   SpO2 98%   Physical Exam  Constitutional: He appears well-developed and well-nourished. No distress.  HENT:  Head: Normocephalic and atraumatic.  Neck: Neck supple.  Cardiovascular: Normal rate and regular rhythm.   Pulmonary/Chest: Effort normal and breath sounds normal. No respiratory distress. He has no wheezes. He has no rales.  Abdominal: Soft. He exhibits no distension and no mass. There is no tenderness. There is no rebound and no guarding.  Neurological: He is alert. He exhibits normal muscle tone.  Skin: He is not diaphoretic.  Psychiatric: His speech is normal. He is slowed. He exhibits a depressed mood. He expresses suicidal ideation. He expresses no homicidal ideation. He expresses suicidal plans.  Nursing note and vitals reviewed.    ED Treatments / Results  Labs (all labs ordered are listed, but only abnormal results are  displayed) Labs Reviewed  COMPREHENSIVE METABOLIC PANEL - Abnormal; Notable for the following:       Result Value   Potassium 3.3 (*)    Chloride 112 (*)    Glucose, Bld 114 (*)    Creatinine, Ser 1.40 (*)    Calcium 8.6 (*)    ALT 10 (*)    GFR calc non Af Amer 53 (*)    All other components within normal limits  ACETAMINOPHEN LEVEL - Abnormal; Notable for the following:    Acetaminophen (Tylenol), Serum <10 (*)    All other components within normal limits  HEPATIC FUNCTION PANEL - Abnormal; Notable for the following:    ALT 11 (*)    All other components within normal limits  URINE RAPID DRUG SCREEN, HOSP PERFORMED - Abnormal; Notable for the  following:    Cocaine POSITIVE (*)    All other components within normal limits  ETHANOL  SALICYLATE LEVEL  CBC  URINE RAPID DRUG SCREEN, HOSP PERFORMED  ETHANOL    EKG  EKG Interpretation None       Radiology Dg Chest 2 View  Result Date: 10/05/2015 CLINICAL DATA:  Chest pain and shortness of breath for 12 hours EXAM: CHEST  2 VIEW COMPARISON:  04/27/2015 FINDINGS: The heart size and mediastinal contours are within normal limits. Both lungs are clear. The visualized skeletal structures are unremarkable. IMPRESSION: No active cardiopulmonary disease. Electronically Signed   By: Judie PetitM.  Shick M.D.   On: 10/05/2015 13:59    Procedures Procedures (including critical care time)  Medications Ordered in ED Medications  potassium chloride SA (K-DUR,KLOR-CON) CR tablet 20 mEq (not administered)     Initial Impression / Assessment and Plan / ED Course  I have reviewed the triage vital signs and the nursing notes.  Pertinent labs & imaging results that were available during my care of the patient were reviewed by me and considered in my medical decision making (see chart for details).  Clinical Course    Afebrile nontoxic patient with c/o suicidal ideation with plan to drive head on into traffic.  Denies HI.  Pt is voluntary.  Pending placement.  He is medically cleared.    Final Clinical Impressions(s) / ED Diagnoses   Final diagnoses:  Suicidal ideation  Depression    New Prescriptions New Prescriptions   No medications on file     Trixie Dredgemily Corderius Saraceni, PA-C 10/06/15 0340    Courteney Lyn Mackuen, MD 10/06/15 [phone]

## 2015-10-06 NOTE — ED Notes (Signed)
[facility] has been [facility] [place] tomorrow after 9 am. [phone], accepting Dr. Roselle LocusSaeed.

## 2015-10-06 NOTE — ED Notes (Signed)
Patient expresses concerns about bed placement in [place], patient asking to speak to someone. Paged behavorial health line and waiting for response.

## 2015-10-06 NOTE — ED Notes (Signed)
Behavioral health called back. Will continue working on closer bed. Stated if patient tries to leave must IVC.

## 2015-10-06 NOTE — ED Triage Notes (Signed)
Pt reports to the ED for eval of SI. PT was just seen for CP and was cleared and d/c. Never left lobby. Returned to desk after some time and states he did not mention that he has also been having SI since today. Pt has hx of substance abuse (cocaine/crack and ETOH) last use yesterday. Pt admits to AVH but states he attributed it to the drug and ETOH abuse. Denies any command hallucinations. Pt A&OX4, resp e/u, and skin warm and dry.

## 2015-10-06 NOTE — ED Triage Notes (Signed)
RN at bed side while sitter took break
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
## 2015-10-06 NOTE — BHH Counselor (Signed)
Advised hospital staff that social work has departed for day, and other placement will not occur.  Advised hospital staff to IVC Pt if he tries to leave hospital, given his stated suicidal ideation.

## 2015-10-07 DIAGNOSIS — F1994 Other psychoactive substance use, unspecified with psychoactive substance-induced mood disorder: Secondary | ICD-10-CM

## 2015-10-07 NOTE — Progress Notes (Signed)
ED requests pt be re-evaluated for potential d/c. Pt was accepted [facility] but is voluntary and refuses placement. Per ED, feels as though pt does not warrant IVC. Informed psych team. Pt to be re-assessed via telepsychiatry today.  Ilean SkillMeghan Yorley Buch, MSW, LCSW Clinical Social Work, Disposition  10/07/2015 [phone]

## 2015-10-07 NOTE — ED Notes (Signed)
Patient made 2 phone called this morning.

## 2015-10-07 NOTE — ED Notes (Signed)
Patient was given a snack and drink. A regular diet order was ordered for lunch. 

## 2015-10-07 NOTE — ED Provider Notes (Signed)
Patient is now denying homicidal or suicidal thoughts or ideations.  He wants to go home and follow-up as an outpatient for his substance abuse.  He was evaluated by TTS and they agreed.  Will be discharged.   Nelva Nayobert Micharl Helmes, MD 10/07/15 [phone]

## 2015-10-07 NOTE — ED Notes (Signed)
Pt moved to POD C Room 21.
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
## 2015-10-07 NOTE — ED Notes (Signed)
Patient has had a 3rd call this morning, Pt. Was told that he can't have any more phone calls today.

## 2015-10-07 NOTE — ED Notes (Signed)
Breakfast tray ordered 

## 2015-10-07 NOTE — ED Notes (Addendum)
When asking patient to sign consent for transfer to another facility, patient states he does not want to go to a facility so far away [facility] [place]  Patient currently refusing transfer.  Patient informed that this nurse will follow up to determine disposition.  Patient informed that if transfer to [facility] is his plan of care and no other facility is available and he continues to refuse he will be involuntarily committed.

## 2015-10-19 ENCOUNTER — Encounter (HOSPITAL_COMMUNITY): Payer: Self-pay

## 2015-10-19 ENCOUNTER — Emergency Department (HOSPITAL_COMMUNITY)
Admission: EM | Admit: 2015-10-19 | Discharge: 2015-10-19 | Disposition: A | Discharge status: Home / Self Care | Payer: Medicare Other | Attending: Emergency Medicine | Admitting: Emergency Medicine

## 2015-10-19 DIAGNOSIS — I10 Essential (primary) hypertension: Secondary | ICD-10-CM | POA: Insufficient documentation

## 2015-10-19 DIAGNOSIS — F17210 Nicotine dependence, cigarettes, uncomplicated: Secondary | ICD-10-CM | POA: Diagnosis not present

## 2015-10-19 DIAGNOSIS — Z202 Contact with and (suspected) exposure to infections with a predominantly sexual mode of transmission: Secondary | ICD-10-CM | POA: Diagnosis not present

## 2015-10-19 NOTE — ED Notes (Signed)
Pt is requesting to be tested for STDs recent exposure last week. Denies any symptoms. He wants to be tested for "all STDs."

## 2015-10-19 NOTE — Discharge Instructions (Signed)
Please follow-up at the health department for further testing as needed. You will be called with any positive results that require further management

## 2015-10-19 NOTE — ED Notes (Signed)
Declined W/C at D/C and was escorted to lobby by RN. 

## 2015-10-19 NOTE — ED Provider Notes (Signed)
[facility] DEPT Provider Note   CSN: [id number] Arrival date & time: 10/19/15  1155   By signing my name below, I, Christy SartoriusAnastasia Kolousek, attest that this documentation has been prepared under the direction and in the presence of  Newell RubbermaidJeffrey Nicholis Stepanek, PA-C. Electronically Signed: Christy SartoriusAnastasia Kolousek, ED Scribe. 10/19/15. 12:44 PM.  History   Chief Complaint Chief Complaint  Patient presents with  . Exposure to STD   The history is provided by the patient and medical records. No language interpreter was used.    HPI Comments:  Timothy PacasChristopher Mcmillan is a 60 y.o. male who presents to the Emergency Department complaining of exposure to an unknown STD.  He notes difficulty urinating which is a chronic issue.  He has a history of prostate issues and takes Lisinopril and Flomax.  Pt would like to be tested before he is treated.  He denies fever, penile discharge and dysuria.    Past Medical History:  Diagnosis Date  . Hypertension     Patient Active Problem List   Diagnosis Date Noted  . Substance induced mood disorder (HCC) 10/07/2015  . Cocaine abuse   . Alcohol abuse   . Acute renal failure (HCC)   . Hypokalemia 11/14/2014  . Polysubstance abuse 11/14/2014  . Alcohol withdrawal delirium (HCC) 11/14/2014  . CAP (community acquired pneumonia) 11/14/2014  . Alcohol withdrawal (HCC) 11/14/2014  . Acute renal failure (ARF) (HCC) 11/14/2014  . Acute encephalopathy     Past Surgical History:  Procedure Laterality Date  . EYE SURGERY    . HERNIA REPAIR         Home Medications    Prior to Admission medications   Medication Sig Start Date End Date Taking? Authorizing Provider  albuterol (PROVENTIL HFA;VENTOLIN HFA) 108 (90 Base) MCG/ACT inhaler Inhale 1-2 puffs into the lungs every 4 (four) hours as needed for wheezing or shortness of breath.    Historical Provider, MD  amLODipine (NORVASC) 10 MG tablet Take 10 mg by mouth daily.    Historical Provider, MD  lisinopril  (PRINIVIL,ZESTRIL) 20 MG tablet Take 1 tablet (20 mg total) by mouth daily. 11/17/14   Calvert CantorSaima Rizwan, MD  Multiple Vitamins-Minerals (MULTIVITAMIN WITH MINERALS) tablet Take 1 tablet by mouth daily.    Historical Provider, MD  tamsulosin (FLOMAX) 0.4 MG CAPS capsule Take 1 capsule (0.4 mg total) by mouth daily. Patient taking differently: Take 0.4 mg by mouth 2 (two) times daily.  11/17/14   Calvert CantorSaima Rizwan, MD    Family History Family History  Problem Relation Age of Onset  . Diabetes type II Father   . Stroke Father     Social History Social History  Substance Use Topics  . Smoking status: Current Every Day Smoker    Packs/day: 1.00    Types: Cigarettes  . Smokeless tobacco: Never Used  . Alcohol use Yes     Comment: intermittently     Allergies   Mold extract [trichophyton]   Review of Systems Review of Systems  Constitutional: Negative for fever.  Genitourinary: Negative for decreased urine volume and dysuria.  All other systems reviewed and are negative.    Physical Exam Updated Vital Signs BP 124/75 (BP Location: Right Arm)   Pulse 65   Temp 98.4 F (36.9 C) (Oral)   Resp 17   SpO2 97%   Physical Exam  Constitutional: He is oriented to person, place, and time. He appears well-developed and well-nourished.  HENT:  Head: Normocephalic and atraumatic.  Eyes: Conjunctivae are normal. Pupils  are equal, round, and reactive to light. Right eye exhibits no discharge. Left eye exhibits no discharge. No scleral icterus.  Neck: Normal range of motion. No JVD present. No tracheal deviation present.  Pulmonary/Chest: Effort normal. No stridor.  Neurological: He is alert and oriented to person, place, and time. Coordination normal.  Psychiatric: He has a normal mood and affect. His behavior is normal. Judgment and thought content normal.  Nursing note and vitals reviewed.    ED Treatments / Results   DIAGNOSTIC STUDIES:  Oxygen Saturation is 97% on RA, NML by my  interpretation.    COORDINATION OF CARE:  12:44 PM Discussed treatment plan with pt at bedside and pt agreed to plan.  Labs (all labs ordered are listed, but only abnormal results are displayed) Labs Reviewed  HIV ANTIBODY (ROUTINE TESTING)  RPR  GC/CHLAMYDIA PROBE AMP ([place]) NOT AT [facility]    EKG  EKG Interpretation None       Radiology No results found.  Procedures Procedures (including critical care time)  Medications Ordered in ED Medications - No data to display   Initial Impression / Assessment and Plan / ED Course  I have reviewed the triage vital signs and the nursing notes.  Pertinent labs & imaging results that were available during my care of the patient were reviewed by me and considered in my medical decision making (see chart for details).  Clinical Course     Pt arrives for asymptomatic STD check. Discussed safe sexual practices. Patient will be tested here at his request. Pt is advised to follow up for free testing at local health department in the future. Pt appears safe for discharge.   Final Clinical Impressions(s) / ED Diagnoses   Final diagnoses:  STD exposure    New Prescriptions Discharge Medication List as of 10/19/2015  1:28 PM     I personally performed the services described in this documentation, which was scribed in my presence. The recorded information has been reviewed and is accurate.    Eyvonne Mechanic, PA-C 10/19/15 1352    Benjiman Core, MD 10/20/15 [id number]

## 2015-10-20 ENCOUNTER — Telehealth (HOSPITAL_BASED_OUTPATIENT_CLINIC_OR_DEPARTMENT_OTHER): Payer: Self-pay

## 2015-10-20 LAB — HIV ANTIBODY (ROUTINE TESTING W REFLEX): HIV Screen 4th Generation wRfx: NONREACTIVE

## 2015-10-20 LAB — RPR: RPR Ser Ql: NONREACTIVE

## 2015-10-21 ENCOUNTER — Telehealth (HOSPITAL_COMMUNITY): Payer: Self-pay

## 2015-10-21 LAB — GC/CHLAMYDIA PROBE AMP (~~LOC~~) NOT AT ARMC
Chlamydia: NEGATIVE
Neisseria Gonorrhea: NEGATIVE

## 2015-10-21 NOTE — Telephone Encounter (Signed)
Pt calling for STD results.  ID verified x 2.  Pt informed both Gonorrhea and Chlamydia are negative and HIV and RPR are non reactive.  

## 2015-12-10 ENCOUNTER — Emergency Department (HOSPITAL_COMMUNITY): Payer: Medicare Other

## 2015-12-10 ENCOUNTER — Emergency Department (HOSPITAL_COMMUNITY)
Admission: EM | Admit: 2015-12-10 | Discharge: 2015-12-12 | Disposition: A | Discharge status: Home / Self Care | Payer: Medicare Other | Attending: Emergency Medicine | Admitting: Emergency Medicine

## 2015-12-10 ENCOUNTER — Encounter (HOSPITAL_COMMUNITY): Payer: Self-pay | Admitting: Emergency Medicine

## 2015-12-10 DIAGNOSIS — R451 Restlessness and agitation: Secondary | ICD-10-CM | POA: Insufficient documentation

## 2015-12-10 DIAGNOSIS — I10 Essential (primary) hypertension: Secondary | ICD-10-CM | POA: Insufficient documentation

## 2015-12-10 DIAGNOSIS — F17210 Nicotine dependence, cigarettes, uncomplicated: Secondary | ICD-10-CM | POA: Insufficient documentation

## 2015-12-10 DIAGNOSIS — F1494 Cocaine use, unspecified with cocaine-induced mood disorder: Secondary | ICD-10-CM | POA: Diagnosis not present

## 2015-12-10 DIAGNOSIS — R0602 Shortness of breath: Secondary | ICD-10-CM | POA: Diagnosis not present

## 2015-12-10 DIAGNOSIS — Z79899 Other long term (current) drug therapy: Secondary | ICD-10-CM | POA: Diagnosis not present

## 2015-12-10 DIAGNOSIS — F29 Unspecified psychosis not due to a substance or known physiological condition: Secondary | ICD-10-CM | POA: Insufficient documentation

## 2015-12-10 DIAGNOSIS — F1910 Other psychoactive substance abuse, uncomplicated: Secondary | ICD-10-CM | POA: Diagnosis present

## 2015-12-10 DIAGNOSIS — F22 Delusional disorders: Secondary | ICD-10-CM | POA: Diagnosis present

## 2015-12-10 LAB — CBC WITH DIFFERENTIAL/PLATELET
BASOS ABS: 0 10*3/uL (ref 0.0–0.1)
Basophils Relative: 0 %
Eosinophils Absolute: 0 10*3/uL (ref 0.0–0.7)
Eosinophils Relative: 0 %
HEMATOCRIT: 44.3 % (ref 39.0–52.0)
Hemoglobin: 15.9 g/dL (ref 13.0–17.0)
LYMPHS PCT: 20 %
Lymphs Abs: 1.7 10*3/uL (ref 0.7–4.0)
MCH: 33.4 pg (ref 26.0–34.0)
MCHC: 35.9 g/dL (ref 30.0–36.0)
MCV: 93.1 fL (ref 78.0–100.0)
Monocytes Absolute: 0.5 10*3/uL (ref 0.1–1.0)
Monocytes Relative: 6 %
NEUTROS ABS: 6.2 10*3/uL (ref 1.7–7.7)
NEUTROS PCT: 74 %
PLATELETS: 228 10*3/uL (ref 150–400)
RBC: 4.76 MIL/uL (ref 4.22–5.81)
RDW: 13 % (ref 11.5–15.5)
WBC: 8.4 10*3/uL (ref 4.0–10.5)

## 2015-12-10 LAB — COMPREHENSIVE METABOLIC PANEL
ALK PHOS: 53 U/L (ref 38–126)
ALT: 10 U/L — ABNORMAL LOW (ref 17–63)
ANION GAP: 14 (ref 5–15)
AST: 25 U/L (ref 15–41)
Albumin: 4.2 g/dL (ref 3.5–5.0)
BILIRUBIN TOTAL: 1.1 mg/dL (ref 0.3–1.2)
BUN: 5 mg/dL — ABNORMAL LOW (ref 6–20)
CALCIUM: 9.1 mg/dL (ref 8.9–10.3)
CO2: 18 mmol/L — ABNORMAL LOW (ref 22–32)
Chloride: 107 mmol/L (ref 101–111)
Creatinine, Ser: 1.34 mg/dL — ABNORMAL HIGH (ref 0.61–1.24)
GFR calc non Af Amer: 56 mL/min — ABNORMAL LOW (ref >60)
GLUCOSE: 115 mg/dL — AB (ref 65–99)
Potassium: 3.2 mmol/L — ABNORMAL LOW (ref 3.5–5.1)
Sodium: 139 mmol/L (ref 135–145)
TOTAL PROTEIN: 6.9 g/dL (ref 6.5–8.1)

## 2015-12-10 LAB — I-STAT TROPONIN, ED
Troponin i, poc: 0 ng/mL (ref 0.00–0.08)
Troponin i, poc: 0 ng/mL (ref 0.00–0.08)

## 2015-12-10 LAB — SALICYLATE LEVEL

## 2015-12-10 LAB — ETHANOL: Alcohol, Ethyl (B): <5 mg/dL (ref <5)

## 2015-12-10 LAB — ACETAMINOPHEN LEVEL

## 2015-12-10 LAB — CK: Total CK: 254 U/L (ref 49–397)

## 2015-12-10 LAB — BRAIN NATRIURETIC PEPTIDE: B NATRIURETIC PEPTIDE 5: 37.5 pg/mL (ref 0.0–100.0)

## 2015-12-10 MED ORDER — ZIPRASIDONE MESYLATE 20 MG IM SOLR
10.0000 mg | Freq: Once | INTRAMUSCULAR | Status: AC
  Administered 2015-12-10: 10 mg via INTRAMUSCULAR
  Filled 2015-12-10: qty 20

## 2015-12-10 MED ORDER — MIDAZOLAM HCL 5 MG/5ML IJ SOLN: 5.0000 mg | Freq: Once | INTRAMUSCULAR | Status: DC

## 2015-12-10 MED ORDER — ASPIRIN 81 MG PO CHEW
324.0000 mg | CHEWABLE_TABLET | Freq: Once | ORAL | Status: DC
  Filled 2015-12-10: qty 4

## 2015-12-10 MED ORDER — LORAZEPAM 2 MG/ML IJ SOLN
2.0000 mg | Freq: Once | INTRAMUSCULAR | Status: AC
  Administered 2015-12-10: 2 mg via INTRAVENOUS
  Filled 2015-12-10: qty 1

## 2015-12-10 MED ORDER — MIDAZOLAM HCL 2 MG/2ML IJ SOLN: 5.0000 mg | Freq: Once | INTRAMUSCULAR | Status: DC

## 2015-12-10 MED ORDER — SODIUM CHLORIDE 0.9 % IV BOLUS (SEPSIS)
1000.0000 mL | Freq: Once | INTRAVENOUS | Status: AC
  Administered 2015-12-10: 1000 mL via INTRAVENOUS

## 2015-12-10 MED ORDER — STERILE WATER FOR INJECTION IJ SOLN
INTRAMUSCULAR | Status: AC
  Administered 2015-12-10: 1.2 mL
  Filled 2015-12-10: qty 10

## 2015-12-10 NOTE — ED Notes (Signed)
Security with pt. Pt still refusing to change into maroon scrubs. Pt providing urine sample.
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
## 2015-12-10 NOTE — ED Provider Notes (Addendum)
Complains of chest pain onset today aftter using cocaine. Level V caveat secondary complaint patient is highly agitated and will not answer all questions. Patient was treated with Geodon IM prior to my exam by Dr. Radford PaxBeaton. He states "you're giving me HIV by inject immediately with blood." He states this is the nurse is starting IV.Marland Kitchen. Patient is uncooperative with exam and with questioning. I don't feel that he has capacity to refuse care at present. He will be involuntarily committed for treatment. IVC forms and first exam form filled out by me 7:20 PM patient is resting more comfortably after treatment with IV at ativan.  10:40 PM patient is sleeping, opens eyes and has purposeful movement to tactile stimulus. Nonverbal and remains somnolent without tactile stimulation. Glasgow Coma Score equals 8  12:20 AM patient remains somnolent. He now arouses to verbal stimulus. He speaks one or 2 words. Follow simple commands. Falls back asleep after a few seconds. Patient signed out to [facility]pos at 12:20 AM  Patient felt to have cocaine-induced chest pain and delirium, likely drug-induced and psychosis. Noted to be hypokalemic.oral Potassium supplementation ordered   Doug SouSam Taryll Reichenberger, MD 12/11/15 [id number]    Doug SouSam Shaurya Rawdon, MD 12/11/15 [id number]
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
## 2015-12-10 NOTE — ED Notes (Signed)
Pt SpO2 decreased to 86-87% on RA after geodon and ativan. Pt placed on 4L [place] and SpO2 improved to 94%.

## 2015-12-11 LAB — RAPID HIV SCREEN (HIV 1/2 AB+AG)
HIV 1/2 ANTIBODIES: NONREACTIVE
HIV-1 P24 Antigen - HIV24: NONREACTIVE

## 2015-12-11 LAB — RAPID URINE DRUG SCREEN, HOSP PERFORMED
AMPHETAMINES: NOT DETECTED
Barbiturates: NOT DETECTED
Benzodiazepines: NOT DETECTED
Cocaine: POSITIVE — AB
Opiates: NOT DETECTED
Tetrahydrocannabinol: NOT DETECTED

## 2015-12-11 MED ORDER — ZIPRASIDONE MESYLATE 20 MG IM SOLR: 20.0000 mg | Freq: Once | INTRAMUSCULAR | Status: DC | PRN

## 2015-12-11 MED ORDER — POTASSIUM CHLORIDE CRYS ER 20 MEQ PO TBCR
40.0000 meq | EXTENDED_RELEASE_TABLET | Freq: Once | ORAL | Status: AC
  Administered 2015-12-11: 40 meq via ORAL
  Filled 2015-12-11: qty 2

## 2015-12-11 MED ORDER — LORAZEPAM 2 MG/ML IJ SOLN: 2.0000 mg | Freq: Once | INTRAMUSCULAR | Status: DC | PRN

## 2015-12-11 MED ORDER — LORAZEPAM 1 MG PO TABS: 1.0000 mg | ORAL_TABLET | Freq: Three times a day (TID) | ORAL | Status: DC | PRN

## 2015-12-11 MED ORDER — DIPHENHYDRAMINE HCL 50 MG/ML IJ SOLN: 50.0000 mg | Freq: Once | INTRAMUSCULAR | Status: DC | PRN

## 2015-12-11 NOTE — BH Assessment (Signed)
Patient refused to speak to anyone on the computer.  Patient reports that he will only on;lys speak to someone in person.

## 2015-12-11 NOTE — BH Assessment (Signed)
Writer consulted with the NP, Claudette Headonrad [place].  Per Renata Capriceonrad, he will be able to assess the patient face to face after 2pm

## 2015-12-11 NOTE — ED Notes (Signed)
Attempted to awake pt for medications and further assessment. Pt is arousable but will not completely answer questions or sit up for medication administration. He answers questions briefly and starts snoring even when sitting up.

## 2015-12-11 NOTE — ED Notes (Signed)
Pt sleeping. Will give snack when awake.

## 2015-12-11 NOTE — ED Notes (Signed)
Pt requesting HIV/STD testing. EDP notified.

## 2015-12-11 NOTE — ED Notes (Signed)
Attempted to wake pt up for further questioning and pt is arousable but with difficulty. He will answer questions briefly before starting to snore

## 2015-12-11 NOTE — ED Notes (Signed)
TTS was trying to evaluate patient but he was unable to stay awake.  TTS hung up.

## 2015-12-11 NOTE — ED Notes (Signed)
Pt states that he wants to see Counselor face to face not on TTS.

## 2015-12-11 NOTE — ED Notes (Signed)
Pt is sleeping. Snack will be given when pt is awake

## 2015-12-11 NOTE — ED Notes (Signed)
Pt requesting HIV test.Stacy RN notified

## 2015-12-11 NOTE — ED Notes (Signed)
[facility]-- pt will see face to face this afternoon.

## 2015-12-11 NOTE — ED Notes (Addendum)
[place] sister from [place].

## 2015-12-11 NOTE — ED Notes (Signed)
Breakfast Tray Ordered. 

## 2015-12-12 DIAGNOSIS — Z823 Family history of stroke: Secondary | ICD-10-CM

## 2015-12-12 DIAGNOSIS — F17210 Nicotine dependence, cigarettes, uncomplicated: Secondary | ICD-10-CM | POA: Diagnosis not present

## 2015-12-12 DIAGNOSIS — F1910 Other psychoactive substance abuse, uncomplicated: Secondary | ICD-10-CM

## 2015-12-12 DIAGNOSIS — Z833 Family history of diabetes mellitus: Secondary | ICD-10-CM

## 2015-12-12 DIAGNOSIS — Z79899 Other long term (current) drug therapy: Secondary | ICD-10-CM

## 2015-12-12 LAB — RPR: RPR Ser Ql: NONREACTIVE

## 2015-12-12 NOTE — ED Notes (Signed)
Meal tray arrived for pt. Sitter at bedside. Pt calm and cooperative at this time.

## 2015-12-12 NOTE — ED Provider Notes (Signed)
1:35 PM  Behavioral health called to request patient be discharged.  Behavioral health evaluated the patient via Tele eval this morning. Patient denies any complaints including no SI or HI. Patient not felt to be a danger to himself or others and is no longer having any psychosis.  They recommended patient be discharged with plans to follow up as an outpatient with [facility].  Patient was evaluated by me and had no complaints. All of his workup results were explained. He had no questions. He understands return precautions for any return of symptoms and he also understands follow-up plans. He had no other questions or concerns and was discharged in good condition.  Clinical Impression: 1. Psychosis, unspecified psychosis type     Disposition: Discharge  Condition: Good  I have discussed the results, Dx and Tx plan with the pt(& family if present). He/she/they expressed understanding and agree(s) with the plan. Discharge instructions discussed at great length. Strict return precautions discussed and pt &/or family have verbalized understanding of the instructions. No further questions at time of discharge.    Discharge Medication List as of 12/12/2015  2:21 PM      Follow Up: [facility] [address] [place] [id number] [phone]    [facility] [address] [id number] [place] [id number] [phone]  If symptoms worsen  [facility] [address] [place] [id number] [phone]  Schedule an appointment as soon as possible for a visit       Heide Scaleshristopher J Tegeler, MD 12/12/15 2038

## 2015-12-12 NOTE — ED Notes (Signed)
Received care of the patient at this time

## 2015-12-12 NOTE — Consult Note (Signed)
Telepsych Consultation   Reason for Consult:  Suicidal, Paranoid Referring Physician:  EDP Patient Identification: Timothy Mcmillan MRN:  [id number] Principal Diagnosis:  Polysubstance abuse  Diagnosis:  Polysubstance abuse  Patient Active Problem List   Diagnosis Date Noted  . Substance induced mood disorder ([place]) [F19.94] 10/07/2015  . Cocaine abuse [F14.10]   . Alcohol abuse [F10.10]   . Acute renal failure ([place]) [N17.9]   . Hypokalemia [E87.6] 11/14/2014  . Polysubstance abuse [F19.10] 11/14/2014  . Alcohol withdrawal delirium ([place]) [F10.231] 11/14/2014  . CAP (community acquired pneumonia) [J18.9] 11/14/2014  . Alcohol withdrawal ([place]) [F10.239] 11/14/2014  . Acute renal failure (ARF) ([place]) [N17.9] 11/14/2014  . Acute encephalopathy [G93.40]     Total Time spent with patient: 15 minutes  Subjective:   Timothy Mcmillan is a 60 y.o. male patient admitted with paranoia and suicidal ideations. Pt states "I do not want to hurt myself today and I don't use drugs regularly."   HPI:  Timothy Mcmillan is a 60 year old male who presented to the [facility] after using cocaine with paranoia and suicidal ideations. Per note on chart pt stated he thought someone was chasing him and that the medical staff was putting medication in the water. Pt refused to speak to [facility] counselor on camera and this morning was guarded in his answers with this writer's questions during tele psych assessment. Pt was calm, somewhat uncooperative, alert & oriented x 3, lying on the hospital bed, dressed in paper scrubs. Pt denies homicidal and suicidal ideations and also denies auditory and visual hallucinations. Pt does not appear to be responding to internal stimuli. Pt stated he does not use cocaine on a regular basis but does want outpatient therapy to help him deal with his drug use.   Discussed case with Dr Dwyane Dee who recommends Pt be discharged with referrals to [facility] or [facility] for assistance  with his drug/substance abuse.   Past Psychiatric History: Polysubstance abuse, ETOH abuse  Risk to Self: Is patient at risk for suicide?: Yes Risk to Others:  Not at this time Prior Inpatient Therapy:  No Prior Outpatient Therapy:  Yes, last time was in the [place] area but does not remember when.   Past Medical History:  Past Medical History:  Diagnosis Date  . Hypertension     Past Surgical History:  Procedure Laterality Date  . EYE SURGERY    . HERNIA REPAIR     Family History:  Family History  Problem Relation Age of Onset  . Diabetes type II Father   . Stroke Father    Family Psychiatric  History: unknown Social History:  History  Alcohol Use  . Yes    Comment: intermittently     History  Drug Use  . Types: Cocaine, "Crack" cocaine    Comment: last use 8/25    Social History   Social History  . Marital status: Legally Separated    Spouse name: N/A  . Number of children: N/A  . Years of education: N/A   Social History Main Topics  . Smoking status: Current Every Day Smoker    Packs/day: 1.00    Types: Cigarettes  . Smokeless tobacco: Never Used  . Alcohol use Yes     Comment: intermittently  . Drug use:     Types: Cocaine, "Crack" cocaine     Comment: last use 8/25  . Sexual activity: Not Asked   Other Topics Concern  . None   Social History Narrative  . None  Additional Social History:    Allergies:   Allergies  Allergen Reactions  . Mold Extract [Trichophyton] Shortness Of Breath    Labs:  Results for orders placed or performed during the hospital encounter of 12/10/15 (from the past 48 hour(s))  Comprehensive metabolic panel     Status: Abnormal   Collection Time: 12/10/15  6:49 PM  Result Value Ref Range   Sodium 139 135 - 145 mmol/L   Potassium 3.2 (L) 3.5 - 5.1 mmol/L   Chloride 107 101 - 111 mmol/L   CO2 18 (L) 22 - 32 mmol/L   Glucose, Bld 115 (H) 65 - 99 mg/dL   BUN 5 (L) 6 - 20 mg/dL   Creatinine, Ser 1.34 (H) 0.61 -  1.24 mg/dL   Calcium 9.1 8.9 - 10.3 mg/dL   Total Protein 6.9 6.5 - 8.1 g/dL   Albumin 4.2 3.5 - 5.0 g/dL   AST 25 15 - 41 U/L   ALT 10 (L) 17 - 63 U/L   Alkaline Phosphatase 53 38 - 126 U/L   Total Bilirubin 1.1 0.3 - 1.2 mg/dL   GFR calc non Af Amer 56 (L) >60 mL/min   GFR calc Af Amer >60 >60 mL/min    Comment: (NOTE) The eGFR has been calculated using the CKD EPI equation. This calculation has not been validated in all clinical situations. eGFR's persistently <60 mL/min signify possible Chronic Kidney Disease.    Anion gap 14 5 - 15  Brain natriuretic peptide     Status: None   Collection Time: 12/10/15  6:59 PM  Result Value Ref Range   B Natriuretic Peptide 37.5 0.0 - 100.0 pg/mL  CBC with Differential     Status: None   Collection Time: 12/10/15  7:00 PM  Result Value Ref Range   WBC 8.4 4.0 - 10.5 K/uL   RBC 4.76 4.22 - 5.81 MIL/uL   Hemoglobin 15.9 13.0 - 17.0 g/dL   HCT 44.3 39.0 - 52.0 %   MCV 93.1 78.0 - 100.0 fL   MCH 33.4 26.0 - 34.0 pg   MCHC 35.9 30.0 - 36.0 g/dL   RDW 13.0 11.5 - 15.5 %   Platelets 228 150 - 400 K/uL   Neutrophils Relative % 74 %   Neutro Abs 6.2 1.7 - 7.7 K/uL   Lymphocytes Relative 20 %   Lymphs Abs 1.7 0.7 - 4.0 K/uL   Monocytes Relative 6 %   Monocytes Absolute 0.5 0.1 - 1.0 K/uL   Eosinophils Relative 0 %   Eosinophils Absolute 0.0 0.0 - 0.7 K/uL   Basophils Relative 0 %   Basophils Absolute 0.0 0.0 - 0.1 K/uL  Ethanol     Status: None   Collection Time: 12/10/15  7:10 PM  Result Value Ref Range   Alcohol, Ethyl (B) <5 <5 mg/dL    Comment:        LOWEST DETECTABLE LIMIT FOR SERUM ALCOHOL IS 5 mg/dL FOR MEDICAL PURPOSES ONLY   Salicylate level     Status: None   Collection Time: 12/10/15  7:10 PM  Result Value Ref Range   Salicylate Lvl <9.1 2.8 - 30.0 mg/dL  Acetaminophen level     Status: Abnormal   Collection Time: 12/10/15  7:10 PM  Result Value Ref Range   Acetaminophen (Tylenol), Serum <10 (L) 10 - 30 ug/mL     Comment:        THERAPEUTIC CONCENTRATIONS VARY SIGNIFICANTLY. A RANGE OF 10-30 ug/mL MAY BE AN EFFECTIVE  CONCENTRATION FOR MANY PATIENTS. HOWEVER, SOME ARE BEST TREATED AT CONCENTRATIONS OUTSIDE THIS RANGE. ACETAMINOPHEN CONCENTRATIONS >150 ug/mL AT 4 HOURS AFTER INGESTION AND >50 ug/mL AT 12 HOURS AFTER INGESTION ARE OFTEN ASSOCIATED WITH TOXIC REACTIONS.   CK     Status: None   Collection Time: 12/10/15  7:15 PM  Result Value Ref Range   Total CK 254 49 - 397 U/L  I-stat troponin, ED     Status: None   Collection Time: 12/10/15  7:17 PM  Result Value Ref Range   Troponin i, poc 0.00 0.00 - 0.08 ng/mL   Comment 3            Comment: Due to the release kinetics of cTnI, a negative result within the first hours of the onset of symptoms does not rule out myocardial infarction with certainty. If myocardial infarction is still suspected, repeat the test at appropriate intervals.   I-stat troponin, ED     Status: None   Collection Time: 12/10/15 10:47 PM  Result Value Ref Range   Troponin i, poc 0.00 0.00 - 0.08 ng/mL   Comment 3            Comment: Due to the release kinetics of cTnI, a negative result within the first hours of the onset of symptoms does not rule out myocardial infarction with certainty. If myocardial infarction is still suspected, repeat the test at appropriate intervals.   Rapid urine drug screen (hospital performed)     Status: Abnormal   Collection Time: 12/11/15 12:54 AM  Result Value Ref Range   Opiates NONE DETECTED NONE DETECTED   Cocaine POSITIVE (A) NONE DETECTED   Benzodiazepines NONE DETECTED NONE DETECTED   Amphetamines NONE DETECTED NONE DETECTED   Tetrahydrocannabinol NONE DETECTED NONE DETECTED   Barbiturates NONE DETECTED NONE DETECTED    Comment:        DRUG SCREEN FOR MEDICAL PURPOSES ONLY.  IF CONFIRMATION IS NEEDED FOR ANY PURPOSE, NOTIFY LAB WITHIN 5 DAYS.        LOWEST DETECTABLE LIMITS FOR URINE DRUG SCREEN Drug Class        Cutoff (ng/mL) Amphetamine      1000 Barbiturate      200 Benzodiazepine   951 Tricyclics       884 Opiates          300 Cocaine          300 THC              50   RPR     Status: None   Collection Time: 12/11/15  1:16 PM  Result Value Ref Range   RPR Ser Ql Non Reactive Non Reactive    Comment: (NOTE) Performed At: [facility] [place], [place] [id number] Lindon Romp MD [id number]   Rapid HIV screen (HIV 1/2 Ab+Ag)     Status: None   Collection Time: 12/11/15  1:16 PM  Result Value Ref Range   HIV-1 P24 Antigen - HIV24 NON REACTIVE NON REACTIVE   HIV 1/2 Antibodies NON REACTIVE NON REACTIVE   Interpretation (HIV Ag Ab)      A non reactive test result means that HIV 1 or HIV 2 antibodies and HIV 1 p24 antigen were not detected in the specimen.    Current Facility-Administered Medications  Medication Dose Route Frequency Provider Last Rate Last Dose  . aspirin chewable tablet 324 mg  324 mg Oral Once [employer], PA-C   Stopped at 12/10/15  1920  . diphenhydrAMINE (BENADRYL) injection 50 mg  50 mg Intramuscular Once PRN Forde Dandy, MD      . LORazepam (ATIVAN) injection 2 mg  2 mg Intravenous Once PRN Forde Dandy, MD      . LORazepam (ATIVAN) tablet 1 mg  1 mg Oral Q8H PRN Forde Dandy, MD      . midazolam (VERSED) injection 5 mg  5 mg Intramuscular Once Orlie Dakin, MD   Stopped at 12/10/15 2046  . ziprasidone (GEODON) injection 20 mg  20 mg Intramuscular Once PRN Forde Dandy, MD       Current Outpatient Prescriptions  Medication Sig Dispense Refill  . albuterol (PROVENTIL HFA;VENTOLIN HFA) 108 (90 Base) MCG/ACT inhaler Inhale 1-2 puffs into the lungs every 4 (four) hours as needed for wheezing or shortness of breath.    Marland Kitchen amLODipine (NORVASC) 10 MG tablet Take 10 mg by mouth daily.    Marland Kitchen lisinopril (PRINIVIL,ZESTRIL) 20 MG tablet Take 1 tablet (20 mg total) by mouth daily. 30 tablet 0  . Multiple Vitamins-Minerals (MULTIVITAMIN  WITH MINERALS) tablet Take 1 tablet by mouth daily.    . tamsulosin (FLOMAX) 0.4 MG CAPS capsule Take 1 capsule (0.4 mg total) by mouth daily. (Patient taking differently: Take 0.4 mg by mouth 2 (two) times daily. ) 30 capsule 0    Musculoskeletal: Unable to assess: camera  Psychiatric Specialty Exam: Physical Exam  Review of Systems  Psychiatric/Behavioral: Positive for substance abuse and suicidal ideas (substance induced). Negative for depression, hallucinations and memory loss. The patient is not nervous/anxious and does not have insomnia.   All other systems reviewed and are negative.   Blood pressure 129/90, pulse 60, temperature 97.9 F (36.6 C), temperature source Oral, resp. rate 16, SpO2 97 %.There is no height or weight on file to calculate BMI.  General Appearance: Casual and Fairly Groomed  Eye Contact:  Fair  Speech:  Clear and Coherent and Normal Rate  Volume:  Normal  Mood:  Anxious and Depressed  Affect:  Congruent, Depressed and Flat  Thought Process:  Coherent, Goal Directed and Linear  Orientation:  Full (Time, Place, and Person)  Thought Content:  Logical  Suicidal Thoughts:  No  Homicidal Thoughts:  No  Memory:  Immediate;   Good Recent;   Good Remote;   Fair  Judgement:  Fair  Insight:  Fair  Psychomotor Activity:  Normal  Concentration:  Concentration: Good and Attention Span: Good  Recall:  Good  Fund of Knowledge:  Good  Language:  Good  Akathisia:  No  Handed:  Right  AIMS (if indicated):     Assets:  Agricultural consultant Housing Intimacy Resilience Social Support Transportation  ADL's:  Intact  Cognition:  WNL  Sleep:        Treatment Plan Summary: Referral provided to [facility] and [facility]   Follow up with outpatient resources for assistance with drug & alcohol abuse.  Disposition: No evidence of imminent risk to self or others at present.   Patient does not meet criteria for psychiatric inpatient  admission.  Ethelene Hal, NP 12/12/2015 9:55 AM

## 2015-12-12 NOTE — ED Notes (Signed)
Patient was given a snack and drink, and a regular diet ordered for lunch. 

## 2015-12-20 ENCOUNTER — Emergency Department (HOSPITAL_COMMUNITY)
Admission: EM | Admit: 2015-12-20 | Discharge: 2015-12-20 | Discharge status: Against Medical Advice | Payer: Medicare Other

## 2015-12-20 NOTE — ED Triage Notes (Signed)
Pt states his chest pain has resolved, although obvious alcohol odor, he denies any etoh intoxication, also states he "does not need us to do anything for him and that his friend is coming to get him."

## 2015-12-20 NOTE — ED Notes (Signed)
Pt requested a phone to "talk to his family before he could have anything done." We obliged after which pt stated he is ready to go and someone will come get him from the ED waiting room.Pt was escorted to the waiting room per his request where he was also shown where he could use a patients phone.

## 2016-02-13 ENCOUNTER — Ambulatory Visit: Payer: Medicare Other | Admitting: Podiatry

## 2016-02-16 DIAGNOSIS — F1020 Alcohol dependence, uncomplicated: Secondary | ICD-10-CM | POA: Insufficient documentation

## 2016-03-13 DIAGNOSIS — M542 Cervicalgia: Secondary | ICD-10-CM | POA: Insufficient documentation

## 2016-03-13 DIAGNOSIS — N400 Enlarged prostate without lower urinary tract symptoms: Secondary | ICD-10-CM | POA: Insufficient documentation

## 2016-03-13 DIAGNOSIS — F319 Bipolar disorder, unspecified: Secondary | ICD-10-CM | POA: Insufficient documentation

## 2016-03-13 DIAGNOSIS — G90522 Complex regional pain syndrome I of left lower limb: Secondary | ICD-10-CM | POA: Insufficient documentation

## 2016-03-18 DIAGNOSIS — R413 Other amnesia: Secondary | ICD-10-CM | POA: Insufficient documentation

## 2016-03-18 DIAGNOSIS — J4520 Mild intermittent asthma, uncomplicated: Secondary | ICD-10-CM | POA: Insufficient documentation

## 2016-03-18 DIAGNOSIS — H11003 Unspecified pterygium of eye, bilateral: Secondary | ICD-10-CM | POA: Insufficient documentation

## 2016-03-18 DIAGNOSIS — R9431 Abnormal electrocardiogram [ECG] [EKG]: Secondary | ICD-10-CM | POA: Insufficient documentation

## 2016-03-18 DIAGNOSIS — N5314 Retrograde ejaculation: Secondary | ICD-10-CM | POA: Insufficient documentation

## 2016-05-16 DIAGNOSIS — F333 Major depressive disorder, recurrent, severe with psychotic symptoms: Secondary | ICD-10-CM | POA: Insufficient documentation

## 2016-05-20 ENCOUNTER — Emergency Department (HOSPITAL_COMMUNITY): Payer: Medicare Other

## 2016-05-20 ENCOUNTER — Observation Stay (HOSPITAL_COMMUNITY)
Admission: EM | Admit: 2016-05-20 | Discharge: 2016-05-20 | Disposition: A | Discharge status: Other Acute Inpatient Hospital | Payer: Medicare Other | Attending: Emergency Medicine | Admitting: Emergency Medicine

## 2016-05-20 ENCOUNTER — Encounter (HOSPITAL_COMMUNITY): Payer: Self-pay | Admitting: *Deleted

## 2016-05-20 DIAGNOSIS — R440 Auditory hallucinations: Secondary | ICD-10-CM | POA: Insufficient documentation

## 2016-05-20 DIAGNOSIS — R45851 Suicidal ideations: Secondary | ICD-10-CM

## 2016-05-20 DIAGNOSIS — I10 Essential (primary) hypertension: Secondary | ICD-10-CM | POA: Insufficient documentation

## 2016-05-20 DIAGNOSIS — F332 Major depressive disorder, recurrent severe without psychotic features: Secondary | ICD-10-CM | POA: Diagnosis not present

## 2016-05-20 DIAGNOSIS — F17210 Nicotine dependence, cigarettes, uncomplicated: Secondary | ICD-10-CM | POA: Insufficient documentation

## 2016-05-20 DIAGNOSIS — Z79899 Other long term (current) drug therapy: Secondary | ICD-10-CM | POA: Insufficient documentation

## 2016-05-20 DIAGNOSIS — T1491XA Suicide attempt, initial encounter: Secondary | ICD-10-CM

## 2016-05-20 DIAGNOSIS — F329 Major depressive disorder, single episode, unspecified: Principal | ICD-10-CM | POA: Insufficient documentation

## 2016-05-20 HISTORY — DX: Depression, unspecified: F32.A

## 2016-05-20 HISTORY — DX: Complex regional pain syndrome I, unspecified: G90.50

## 2016-05-20 HISTORY — DX: Major depressive disorder, single episode, unspecified: F32.9

## 2016-05-20 HISTORY — DX: Alcohol abuse, uncomplicated: F10.10

## 2016-05-20 LAB — CBC
HCT: 48.3 % (ref 39.0–52.0)
Hemoglobin: 16.8 g/dL (ref 13.0–17.0)
MCH: 32.9 pg (ref 26.0–34.0)
MCHC: 34.8 g/dL (ref 30.0–36.0)
MCV: 94.7 fL (ref 78.0–100.0)
PLATELETS: 235 10*3/uL (ref 150–400)
RBC: 5.1 MIL/uL (ref 4.22–5.81)
RDW: 13.1 % (ref 11.5–15.5)
WBC: 9.1 10*3/uL (ref 4.0–10.5)

## 2016-05-20 LAB — I-STAT TROPONIN, ED
TROPONIN I, POC: 0 ng/mL (ref 0.00–0.08)
TROPONIN I, POC: 0 ng/mL (ref 0.00–0.08)

## 2016-05-20 LAB — COMPREHENSIVE METABOLIC PANEL
ALBUMIN: 4.6 g/dL (ref 3.5–5.0)
ALK PHOS: 72 U/L (ref 38–126)
ALT: 14 U/L — AB (ref 17–63)
AST: 17 U/L (ref 15–41)
Anion gap: 6 (ref 5–15)
BILIRUBIN TOTAL: 0.9 mg/dL (ref 0.3–1.2)
BUN: 10 mg/dL (ref 6–20)
CALCIUM: 9.1 mg/dL (ref 8.9–10.3)
CO2: 30 mmol/L (ref 22–32)
CREATININE: 1.27 mg/dL — AB (ref 0.61–1.24)
Chloride: 104 mmol/L (ref 101–111)
GFR calc Af Amer: >60 mL/min (ref >60)
GFR calc non Af Amer: 59 mL/min — ABNORMAL LOW (ref >60)
Glucose, Bld: 102 mg/dL — ABNORMAL HIGH (ref 65–99)
Potassium: 3.6 mmol/L (ref 3.5–5.1)
Sodium: 140 mmol/L (ref 135–145)
TOTAL PROTEIN: 7.9 g/dL (ref 6.5–8.1)

## 2016-05-20 LAB — RAPID URINE DRUG SCREEN, HOSP PERFORMED
Amphetamines: NOT DETECTED
Barbiturates: NOT DETECTED
Benzodiazepines: NOT DETECTED
Cocaine: POSITIVE — AB
OPIATES: NOT DETECTED
Tetrahydrocannabinol: NOT DETECTED

## 2016-05-20 LAB — ACETAMINOPHEN LEVEL: Acetaminophen (Tylenol), Serum: <10 ug/mL — ABNORMAL LOW (ref 10–30)

## 2016-05-20 LAB — ETHANOL: Alcohol, Ethyl (B): <5 mg/dL (ref <5)

## 2016-05-20 LAB — SALICYLATE LEVEL: Salicylate Lvl: <7 mg/dL (ref 2.8–30.0)

## 2016-05-20 MED ORDER — LORAZEPAM 1 MG PO TABS: 1.0000 mg | ORAL_TABLET | Freq: Three times a day (TID) | ORAL | Status: DC | PRN

## 2016-05-20 MED ORDER — IBUPROFEN 200 MG PO TABS: 600.0000 mg | ORAL_TABLET | Freq: Three times a day (TID) | ORAL | Status: DC | PRN

## 2016-05-20 MED ORDER — ACETAMINOPHEN 325 MG PO TABS
650.0000 mg | ORAL_TABLET | ORAL | Status: DC | PRN
  Administered 2016-05-20: 650 mg via ORAL
  Filled 2016-05-20: qty 2

## 2016-05-20 NOTE — ED Triage Notes (Signed)
Pt arrives stating he is suicidal with plan to overdose, smells of ETOH, used multiple drugs last night, unsure what, pt very reserved and speaks softly. Unable to obtain other information regarding SI. Pt c/o midsternal chest pain and is hypertensive.

## 2016-05-20 NOTE — ED Notes (Signed)
SECURITY CLEARED BEFORE TRANSFER 

## 2016-05-20 NOTE — ED Notes (Signed)
Patient refused to have repeat TNI drawn.

## 2016-05-20 NOTE — ED Notes (Signed)
Dr. Eudelia Bunch informed that patient refused to have repeat TNI drawn after 2nd draw tube was misplaced. Dr. Eudelia Bunch informed Paulino Rily that he would come over and talk to patient.

## 2016-05-20 NOTE — ED Notes (Signed)
PT REQUESTING TO USE PHONE -2TIME

## 2016-05-20 NOTE — ED Notes (Signed)
Pt c/o chest pain. This nurse notified EDP.

## 2016-05-20 NOTE — ED Notes (Signed)
EKG completed, Given to EDP for review, and then placed on pt chart.

## 2016-05-20 NOTE — ED Notes (Signed)
IVC PAPERS WITH [facility]

## 2016-05-20 NOTE — ED Notes (Signed)
EDP Tegeler at bedside with this RN explaining reason for security presence. Pt expressed concerns of information being shared with security/officers. Concerns for safety were explained to the patient. The patient continued to request answer and stated "I will be following up with this, I don't think legally they should know about me medically." Pt appears to be upset. Pt given plan and procedures for safety such as EKG, Blood work, Monitor vitals. Pt states to this RN "I want nothing to do with you.   Charge RN notified of pt concerns. Another RN to assist with care.

## 2016-05-20 NOTE — ED Triage Notes (Signed)
Pt tells staff that he was using "Speed" last night as we are leaving the room.

## 2016-05-20 NOTE — BH Assessment (Addendum)
[facility] Assessment Progress Note  Per Elta Guadeloupe, NP, this pt requires psychiatric hospitalization.  Malva Limes, RN, [facility] has assigned pt to [facility] Rm [id number]; they will be ready to receive pt at 20:15.  Pt presents under IVC initiated by EDP Lynden Oxford, MD, and IVC documents have been faxed to [facility].  Pt's nurse, Morrie Sheldon, has been notified, and agrees to call report to [phone].  Pt is to be transported via [employer] [facility].   Doylene Canning, MA Triage Specialist [phone]

## 2016-05-20 NOTE — ED Notes (Signed)
Patient transported to X-ray 

## 2016-05-20 NOTE — ED Provider Notes (Signed)
[facility]-EMERGENCY DEPT Provider Note   CSN: [id number] Arrival date & time: 05/20/16  0709     History   Chief Complaint Chief Complaint  Patient presents with  . Suicidal    HPI Timothy Mcmillan is a 61 y.o. male with a past medical history significant for depression, hypertension, and reported prior suicide attempts who presents for suicide attempt. Patient reports that overnight, he tried to kill himself. He reports that he took speed and drink alcohol. He is unsure if he took any other medications. He said this was an attempt to kill himself. He says that he has no homicidal ideation but had SI. He reports some auditory hallucinations and says that he hears voices talking about him. He denies command hallucinations. Patient says that he was having some chest pain this morning that she described as central. He denies any shortness of breath, abdominal pain, nausea, vomiting, or any other complaints.  The history is provided by the patient and medical records. No language interpreter was used.  Mental Health Problem  Presenting symptoms: depression, hallucinations (auditory), suicidal thoughts, suicidal threats and suicide attempt   Presenting symptoms: no agitation   Degree of incapacity (severity):  Severe Onset quality:  Gradual Duration:  1 day Timing:  Constant Progression:  Worsening Chronicity:  Recurrent Context: alcohol use and drug abuse   Treatment compliance:  Untreated Relieved by:  Nothing Worsened by:  Nothing Ineffective treatments:  None tried Associated symptoms: chest pain   Associated symptoms: no fatigue and no headaches     Past Medical History:  Diagnosis Date  . Depression   . ETOH abuse   . Hypertension   . RSD (reflex sympathetic dystrophy)     Patient Active Problem List   Diagnosis Date Noted  . Substance induced mood disorder (HCC) 10/07/2015  . Cocaine abuse   . Alcohol abuse   . Acute renal failure (HCC)   . Hypokalemia 11/14/2014   . Polysubstance abuse 11/14/2014  . Alcohol withdrawal delirium (HCC) 11/14/2014  . CAP (community acquired pneumonia) 11/14/2014  . Alcohol withdrawal (HCC) 11/14/2014  . Acute renal failure (ARF) (HCC) 11/14/2014  . Acute encephalopathy     Past Surgical History:  Procedure Laterality Date  . EYE SURGERY    . HERNIA REPAIR         Home Medications    Prior to Admission medications   Medication Sig Start Date End Date Taking? Authorizing Provider  albuterol (PROVENTIL HFA;VENTOLIN HFA) 108 (90 Base) MCG/ACT inhaler Inhale 1-2 puffs into the lungs every 4 (four) hours as needed for wheezing or shortness of breath.    Historical Provider, MD  amLODipine (NORVASC) 10 MG tablet Take 10 mg by mouth daily.    Historical Provider, MD  lisinopril (PRINIVIL,ZESTRIL) 20 MG tablet Take 1 tablet (20 mg total) by mouth daily. 11/17/14   Calvert Cantor, MD  Multiple Vitamins-Minerals (MULTIVITAMIN WITH MINERALS) tablet Take 1 tablet by mouth daily.    Historical Provider, MD  tamsulosin (FLOMAX) 0.4 MG CAPS capsule Take 1 capsule (0.4 mg total) by mouth daily. Patient taking differently: Take 0.4 mg by mouth 2 (two) times daily.  11/17/14   Calvert Cantor, MD    Family History Family History  Problem Relation Age of Onset  . Diabetes type II Father   . Stroke Father     Social History Social History  Substance Use Topics  . Smoking status: Current Every Day Smoker    Packs/day: 1.00    Types: Cigarettes  .  Smokeless tobacco: Never Used  . Alcohol use Yes     Comment: intermittently     Allergies   Mold extract [trichophyton]   Review of Systems Review of Systems  Constitutional: Negative for chills, diaphoresis, fatigue and fever.  HENT: Negative for congestion.   Respiratory: Negative for cough, chest tightness, shortness of breath, wheezing and stridor.   Cardiovascular: Positive for chest pain. Negative for palpitations and leg swelling.  Gastrointestinal: Negative for  constipation, diarrhea, nausea and vomiting.  Genitourinary: Negative for dysuria.  Musculoskeletal: Negative for back pain, neck pain and neck stiffness.  Skin: Negative for rash and wound.  Neurological: Negative for dizziness, light-headedness, numbness and headaches.  Psychiatric/Behavioral: Positive for hallucinations (auditory) and suicidal ideas. Negative for agitation and confusion.  All other systems reviewed and are negative.    Physical Exam Updated Vital Signs BP (!) 187/119   Pulse 77   Temp 97.8 F (36.6 C) (Oral)   Resp 18   Ht  (1.905 m)   Wt 201 lb (91.2 kg)   SpO2 99%   BMI 25.12 kg/m   Physical Exam  Constitutional: He appears well-developed and well-nourished. No distress.  HENT:  Head: Normocephalic and atraumatic.  Eyes: Conjunctivae are normal.  Neck: Neck supple.  Cardiovascular: Normal rate and regular rhythm.   No murmur heard. Pulmonary/Chest: Effort normal and breath sounds normal. No respiratory distress. He exhibits no tenderness.  Abdominal: Soft. There is no tenderness.  Musculoskeletal: He exhibits no edema or tenderness.  Neurological: He is alert. No sensory deficit.  Skin: Skin is warm and dry. Capillary refill takes less than 2 seconds. He is not diaphoretic. No erythema. No pallor.  Psychiatric: He is actively hallucinating. He expresses suicidal ideation. He expresses no homicidal ideation. He expresses suicidal plans. He expresses no homicidal plans.  Nursing note and vitals reviewed.    ED Treatments / Results  Labs (all labs ordered are listed, but only abnormal results are displayed) Labs Reviewed  COMPREHENSIVE METABOLIC PANEL - Abnormal; Notable for the following:       Result Value   Glucose, Bld 102 (*)    Creatinine, Ser 1.27 (*)    ALT 14 (*)    GFR calc non Af Amer 59 (*)    All other components within normal limits  RAPID URINE DRUG SCREEN, HOSP PERFORMED - Abnormal; Notable for the following:    Cocaine  POSITIVE (*)    All other components within normal limits  ACETAMINOPHEN LEVEL - Abnormal; Notable for the following:    Acetaminophen (Tylenol), Serum <10 (*)    All other components within normal limits  ETHANOL  CBC  SALICYLATE LEVEL  I-STAT TROPOININ, ED    EKG  EKG Interpretation  Date/Time:  Wednesday May 20 2016 08:29:19 EDT Ventricular Rate:  59 PR Interval:    QRS Duration: 74 QT Interval:  459 QTC Calculation: 455 R Axis:   80 Text Interpretation:  Sinus rhythm Anteroseptal infarct, age indeterminate No STEMI Confirmed by Christyann Manolis MD, Y[place]het [id number]) on 05/20/2016 8:54:36 AM       Radiology Dg Chest 2 View  Result Date: 05/20/2016 CLINICAL DATA:  61 year old male with chest pain and shortness of breath. Initial encounter. Smoker. EXAM: CHEST  2 VIEW COMPARISON:  10/05/2015 FINDINGS: Semi upright AP and lateral views of the chest. Tortuous descending thoracic aorta appears stable. Other mediastinal contours are within normal limits. Visualized tracheal air column is within normal limits. No pneumothorax, pulmonary edema, pleural effusion or confluent  pulmonary opacity. No acute osseous abnormality identified. Negative visible bowel gas pattern. IMPRESSION: No acute cardiopulmonary abnormality. Electronically Signed   By: Odessa Fleming M.D.   On: 05/20/2016 07:51    Procedures Procedures (including critical care time)  Medications Ordered in ED Medications  ibuprofen (ADVIL,MOTRIN) tablet 600 mg (not administered)  acetaminophen (TYLENOL) tablet 650 mg (not administered)  LORazepam (ATIVAN) tablet 1 mg (not administered)     Initial Impression / Assessment and Plan / ED Course  I have reviewed the triage vital signs and the nursing notes.  Pertinent labs & imaging results that were available during my care of the patient were reviewed by me and considered in my medical decision making (see chart for details).     Timothy Mcmillan is a 61 y.o. male with a  past medical history significant for depression, hypertension, and reported prior suicide attempts who presents for suicide attempt.   History and exam are seen above.  On exam, patient is speaking quietly and calmly. Patient reports that he has suicidal ideation and attempted to kill himself tonight. Patient denies homicidal ideation. On exam, patient's chest was slightly tender to palpation. Patient's lungs were clear. Patient had no focal neurologic deficits. Patient's abdomen was nontender. Patient had symmetric pulses in all extremities.  Given patient's report to me that he tried to kill himself with overdose and his history of signout attempts and his reported auditory hallucinations, patient was deemed a danger to himself and place under involuntary commitment.  After involuntary commitment, orders placed for patient to have suicide precautions and observation by security.   Patient is awaiting medical clearance for psych evaluation after laboratory tests have been completed. Patient's chest x-ray was unremarkable and EKG showed no evidence of acute ischemia or arrhythmias.  Diagnostic laboratory testing showed patient was positive for cocaine use. Patient's other lab testing was grossly unremarkable. Alcohol was negative.  Patient is medically cleared for further psychiatric workup. Patient was placed under involuntary commitment by me due to being a danger to himself. Next  Anticipate following up on psychiatric recommendations.     Final Clinical Impressions(s) / ED Diagnoses   Final diagnoses:  Suicidal ideation  Suicide attempt    Clinical Impression: 1. Suicidal ideation   2. Suicide attempt     Disposition: Awaiting psychiatric disposition    Heide Scales, MD 05/20/16 1905
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
## 2016-05-20 NOTE — ED Notes (Signed)
Pt admitted to room #43, IVC. Pt behavior cooperative. Pt endorsing "severe depression" identifying "family matters" as stressor. Pt endorsing SI. Pt denies HI. Denies AVH.  Encouragement and support provided. Special checks q 15 mins in place for safety. Video monitoring in place. Will continue to monitor.

## 2016-05-20 NOTE — ED Notes (Signed)
Repeat TNI drawn

## 2016-05-20 NOTE — ED Notes (Signed)
Phlebotomist at bedside.

## 2016-05-20 NOTE — ED Notes (Signed)
This nurse notified phlebotomist of ordered lab.

## 2016-05-20 NOTE — ED Notes (Signed)
PT CURRENTLY ATTEMPTING URINE SAMPLE

## 2016-05-20 NOTE — ED Notes (Signed)
Pt transported to [facility] by [facility] for continuation of specialized care. He left in no acute distress. 
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
## 2016-05-20 NOTE — ED Notes (Signed)
ED Provider at bedside. 

## 2016-05-21 ENCOUNTER — Inpatient Hospital Stay (HOSPITAL_COMMUNITY)
Admission: AD | Admit: 2016-05-21 | Discharge: 2016-05-28 | DRG: 885 | Disposition: A | Discharge status: Home / Self Care | Payer: Medicare Other | Attending: Psychiatry | Admitting: Psychiatry

## 2016-05-21 ENCOUNTER — Encounter (HOSPITAL_COMMUNITY): Payer: Self-pay | Admitting: *Deleted

## 2016-05-21 DIAGNOSIS — F1420 Cocaine dependence, uncomplicated: Secondary | ICD-10-CM | POA: Diagnosis present

## 2016-05-21 DIAGNOSIS — F332 Major depressive disorder, recurrent severe without psychotic features: Principal | ICD-10-CM | POA: Diagnosis present

## 2016-05-21 DIAGNOSIS — Z91048 Other nonmedicinal substance allergy status: Secondary | ICD-10-CM

## 2016-05-21 DIAGNOSIS — G4700 Insomnia, unspecified: Secondary | ICD-10-CM | POA: Diagnosis present

## 2016-05-21 DIAGNOSIS — F411 Generalized anxiety disorder: Secondary | ICD-10-CM | POA: Diagnosis present

## 2016-05-21 DIAGNOSIS — F22 Delusional disorders: Secondary | ICD-10-CM | POA: Diagnosis present

## 2016-05-21 DIAGNOSIS — E118 Type 2 diabetes mellitus with unspecified complications: Secondary | ICD-10-CM | POA: Diagnosis not present

## 2016-05-21 DIAGNOSIS — Z23 Encounter for immunization: Secondary | ICD-10-CM | POA: Diagnosis present

## 2016-05-21 DIAGNOSIS — F1020 Alcohol dependence, uncomplicated: Secondary | ICD-10-CM | POA: Diagnosis not present

## 2016-05-21 DIAGNOSIS — R45851 Suicidal ideations: Secondary | ICD-10-CM | POA: Diagnosis present

## 2016-05-21 DIAGNOSIS — I10 Essential (primary) hypertension: Secondary | ICD-10-CM | POA: Diagnosis present

## 2016-05-21 DIAGNOSIS — F1490 Cocaine use, unspecified, uncomplicated: Secondary | ICD-10-CM | POA: Diagnosis not present

## 2016-05-21 DIAGNOSIS — F1424 Cocaine dependence with cocaine-induced mood disorder: Secondary | ICD-10-CM | POA: Diagnosis present

## 2016-05-21 DIAGNOSIS — Z79899 Other long term (current) drug therapy: Secondary | ICD-10-CM

## 2016-05-21 DIAGNOSIS — F1994 Other psychoactive substance use, unspecified with psychoactive substance-induced mood disorder: Secondary | ICD-10-CM | POA: Diagnosis present

## 2016-05-21 DIAGNOSIS — F17210 Nicotine dependence, cigarettes, uncomplicated: Secondary | ICD-10-CM | POA: Diagnosis not present

## 2016-05-21 MED ORDER — AMLODIPINE BESYLATE 5 MG PO TABS
5.0000 mg | ORAL_TABLET | Freq: Every day | ORAL | Status: DC
  Administered 2016-05-24 – 2016-05-25 (×2): 5 mg via ORAL
  Filled 2016-05-21 (×10): qty 1

## 2016-05-21 MED ORDER — ENSURE ENLIVE PO LIQD
237.0000 mL | Freq: Two times a day (BID) | ORAL | Status: DC
  Administered 2016-05-21 – 2016-05-28 (×15): 237 mL via ORAL

## 2016-05-21 MED ORDER — LISINOPRIL 10 MG PO TABS
10.0000 mg | ORAL_TABLET | Freq: Every day | ORAL | Status: DC
  Administered 2016-05-21 – 2016-05-25 (×5): 10 mg via ORAL
  Filled 2016-05-21 (×2): qty 1
  Filled 2016-05-21: qty 2
  Filled 2016-05-21: qty 1
  Filled 2016-05-21: qty 2
  Filled 2016-05-21 (×4): qty 1

## 2016-05-21 MED ORDER — NICOTINE 21 MG/24HR TD PT24
21.0000 mg | MEDICATED_PATCH | Freq: Every day | TRANSDERMAL | Status: DC
  Administered 2016-05-21 – 2016-05-28 (×8): 21 mg via TRANSDERMAL
  Filled 2016-05-21 (×12): qty 1

## 2016-05-21 MED ORDER — GABAPENTIN 300 MG PO CAPS
300.0000 mg | ORAL_CAPSULE | Freq: Three times a day (TID) | ORAL | Status: DC
  Administered 2016-05-21 – 2016-05-24 (×9): 300 mg via ORAL
  Filled 2016-05-21 (×15): qty 1

## 2016-05-21 MED ORDER — PNEUMOCOCCAL VAC POLYVALENT 25 MCG/0.5ML IJ INJ
0.5000 mL | INJECTION | INTRAMUSCULAR | Status: AC
  Administered 2016-05-22: 0.5 mL via INTRAMUSCULAR

## 2016-05-21 MED ORDER — SERTRALINE HCL 25 MG PO TABS
25.0000 mg | ORAL_TABLET | Freq: Every day | ORAL | Status: DC
  Administered 2016-05-22: 25 mg via ORAL
  Filled 2016-05-21 (×3): qty 1

## 2016-05-21 MED ORDER — MAGNESIUM HYDROXIDE 400 MG/5ML PO SUSP: 30.0000 mL | Freq: Every day | ORAL | Status: DC | PRN

## 2016-05-21 MED ORDER — ALUM & MAG HYDROXIDE-SIMETH 200-200-20 MG/5ML PO SUSP: 30.0000 mL | ORAL | Status: DC | PRN

## 2016-05-21 MED ORDER — ACETAMINOPHEN 325 MG PO TABS
650.0000 mg | ORAL_TABLET | Freq: Four times a day (QID) | ORAL | Status: DC | PRN
  Filled 2016-05-21: qty 2

## 2016-05-21 MED ORDER — TRAZODONE HCL 50 MG PO TABS
50.0000 mg | ORAL_TABLET | Freq: Every evening | ORAL | Status: DC | PRN
  Administered 2016-05-21 – 2016-05-27 (×5): 50 mg via ORAL
  Filled 2016-05-21 (×4): qty 1
  Filled 2016-05-21: qty 42
  Filled 2016-05-21 (×7): qty 1
  Filled 2016-05-21: qty 42
  Filled 2016-05-21 (×5): qty 1
  Filled 2016-05-21 (×2): qty 42

## 2016-05-21 MED ORDER — TRAZODONE HCL 50 MG PO TABS
ORAL_TABLET | ORAL | Status: AC
  Filled 2016-05-21: qty 1

## 2016-05-21 MED ORDER — TAMSULOSIN HCL 0.4 MG PO CAPS
0.4000 mg | ORAL_CAPSULE | Freq: Two times a day (BID) | ORAL | Status: DC
Start: 2016-05-21 — End: 2016-05-28
  Administered 2016-05-21 – 2016-05-28 (×14): 0.4 mg via ORAL
  Filled 2016-05-21: qty 42
  Filled 2016-05-21 (×2): qty 1
  Filled 2016-05-21: qty 42
  Filled 2016-05-21 (×9): qty 1
  Filled 2016-05-21: qty 42
  Filled 2016-05-21 (×5): qty 1
  Filled 2016-05-21: qty 42
  Filled 2016-05-21 (×3): qty 1

## 2016-05-21 NOTE — Tx Team (Signed)
Initial Treatment Plan 05/21/2016 1:52 AM Loralee Pacas ZOX:[id number]    PATIE2NT STRESSORS: Health problems Medication change or noncompliance Substance abuse Other: Conflict with girlfriend   PATIENT STRENGTHS: Ability for insight Average or above average intelligence Capable of independent living General fund of knowledge Motivation for treatment/growth   PATIENT IDENTIFIED PROBLEMS: Depression  Substance abuse  Risk for self harm  Conflict with girlfriend (only support)    "Learn some coping skills"  "Learn how to manage my medications"  "I need to go to rehab"       DISCHARGE CRITERIA:  Ability to meet basic life and health needs Adequate post-discharge living arrangements Improved stabilization in mood, thinking, and/or behavior Motivation to continue treatment in a less acute level of care Verbal commitment to aftercare and medication compliance Withdrawal symptoms are absent or subacute and managed without 24-hour nursing intervention  PRELIMINARY DISCHARGE PLAN: Attend aftercare/continuing care group Attend 12-step recovery group Outpatient therapy Placement in alternative living arrangements  PATIENT/FAMILY INVOLVEMENT: This treatment plan has been presented to and reviewed with the patient, Timothy Mcmillan, and/or family member.  The patient and family have been given the opportunity to ask questions and make suggestions.  Charlott Holler, RN 05/21/2016, 1:52 AM
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
## 2016-05-21 NOTE — Progress Notes (Signed)
D: Patient denies SI, HI to this writer but endorses AVH stating, "I think I hear/see things occasionally".  He states, "my thoughts are crazy right now", however does not elaborate.  Pt. States that he came from the ED last night and did not sleep well.  He states that his appetite is fair and denies any physical complaints.  Pt. Rates his energy as low and concentration is poor.  Pt. Has been up and ambulatory on the unit, he was in the dayroom speaking on the phone and conversing with others.  Pt. Remains cooperative with staff and others on the unit.  Pt. States his goal for today is to work on his grief, anxiety and concentration, he is also expressed interest in developing a plan for after his discharge.  A: Patient given emotional support from RN. Patient encouraged to come to staff with concerns and/or questions. Patient's medication routine continued. Patient's orders and plan of care reviewed.   R: Patient remains appropriate and cooperative. Will continue to monitor patient q15 minutes for safety.
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
## 2016-05-21 NOTE — [facility] Group Notes (Signed)
[facility] LCSW Group Therapy  05/21/2016 12:05 PM  Type of Therapy:  Group Therapy  Participation Level:  Minimal  Participation Quality:  Drowsy  Affect:  Lethargic  Cognitive:  Confused  Insight:  Limited  Engagement in Therapy:  Lacking  Modes of Intervention:  Confrontation, Discussion, Education, Problem-solving, Socialization and Support  Summary of Progress/Problems: Self Sabotage: Group Members were asked to identify the meaning of self sabotage and how this applies in their lives. Group members were asked to explore what types of self sabotage are getting in their way and processed plans for change in order to address these obstacles.   Kathlene Yano N Smart LCSW 05/21/2016, 12:05 PM

## 2016-05-21 NOTE — Tx Team (Signed)
Interdisciplinary Treatment and Diagnostic Plan Update  05/21/2016 Time of Session: 0930 [address] MRN: [id number]  Principal Diagnosis: Cocaine Use Disorder, Severe  Secondary Diagnoses: Active Problems:   MDD (major depressive disorder), recurrent severe, without psychosis (HCC)   Current Medications:  Current Facility-Administered Medications  Medication Dose Route Frequency Provider Last Rate Last Dose  . acetaminophen (TYLENOL) tablet 650 mg  650 mg Oral Q6H PRN Beau Fanny, FNP      . alum & mag hydroxide-simeth (MAALOX/MYLANTA) 200-200-20 MG/5ML suspension 30 mL  30 mL Oral Q4H PRN Beau Fanny, FNP      . amLODipine (NORVASC) tablet 5 mg  5 mg Oral Daily Beau Fanny, FNP      . feeding supplement (ENSURE ENLIVE) (ENSURE ENLIVE) liquid 237 mL  237 mL Oral BID BM Spencer E Simon, PA-C      . lisinopril (PRINIVIL,ZESTRIL) tablet 10 mg  10 mg Oral Daily John C Withrow, FNP      . magnesium hydroxide (MILK OF MAGNESIA) suspension 30 mL  30 mL Oral Daily PRN Beau Fanny, FNP      . nicotine (NICODERM CQ - dosed in mg/24 hours) patch 21 mg  21 mg Transdermal Daily Kerry Hough, PA-C      . [START ON 05/22/2016] pneumococcal 23 valent vaccine (PNU-IMMUNE) injection 0.5 mL  0.5 mL Intramuscular Tomorrow-1000 Spencer E Simon, PA-C      . tamsulosin (FLOMAX) capsule 0.4 mg  0.4 mg Oral BID Beau Fanny, FNP       PTA Medications: Prescriptions Prior to Admission  Medication Sig Dispense Refill Last Dose  . albuterol (PROVENTIL HFA;VENTOLIN HFA) 108 (90 Base) MCG/ACT inhaler Inhale 1-2 puffs into the lungs every 4 (four) hours as needed for wheezing or shortness of breath.   More than a month at Unknown time  . amLODipine (NORVASC) 10 MG tablet Take 10 mg by mouth daily.   More than a month at Unknown time  . lisinopril (PRINIVIL,ZESTRIL) 20 MG tablet Take 1 tablet (20 mg total) by mouth daily. 30 tablet 0 More than a month at Unknown time  . tamsulosin (FLOMAX)  0.4 MG CAPS capsule Take 1 capsule (0.4 mg total) by mouth daily. (Patient taking differently: Take 0.4 mg by mouth 2 (two) times daily. ) 30 capsule 0 More than a month at Unknown time    Patient Stressors: Health problems Medication change or noncompliance Substance abuse Other: Conflict with girlfriend  Patient Strengths: Ability for insight Average or above average intelligence Capable of independent living General fund of knowledge Motivation for treatment/growth  Treatment Modalities: Medication Management, Group therapy, Case management,  1 to 1 session with clinician, Psychoeducation, Recreational therapy.   Physician Treatment Plan for Primary Diagnosis: Cocaine Use Disorder, Severe  Medication Management: Evaluate patient's response, side effects, and tolerance of medication regimen.  Therapeutic Interventions: 1 to 1 sessions, Unit Group sessions and Medication administration.  Evaluation of Outcomes: Progressing  Physician Treatment Plan for Secondary Diagnosis: Active Problems:   MDD (major depressive disorder), recurrent severe, without psychosis (HCC)  Long Term Goal(s):     Short Term Goals:       Medication Management: Evaluate patient's response, side effects, and tolerance of medication regimen.  Therapeutic Interventions: 1 to 1 sessions, Unit Group sessions and Medication administration.  Evaluation of Outcomes: Progressing   RN Treatment Plan for Primary Diagnosis:Cocaine Use Disorder, Severe Long Term Goal(s): Knowledge of disease and therapeutic regimen to maintain health will improve  Short Term Goals: Ability to remain free from injury will improve, Ability to verbalize feelings will improve and Ability to disclose and discuss suicidal ideas  Medication Management: RN will administer medications as ordered by provider, will assess and evaluate patient's response and provide education to patient for prescribed medication. RN will report any  adverse and/or side effects to prescribing provider.  Therapeutic Interventions: 1 on 1 counseling sessions, Psychoeducation, Medication administration, Evaluate responses to treatment, Monitor vital signs and CBGs as ordered, Perform/monitor CIWA, COWS, AIMS and Fall Risk screenings as ordered, Perform wound care treatments as ordered.  Evaluation of Outcomes: Progressing   LCSW Treatment Plan for Primary Diagnosis:Cocaine Use Disorder, Severe Long Term Goal(s): Safe transition to appropriate next level of care at discharge, Engage patient in therapeutic group addressing interpersonal concerns.  Short Term Goals: Engage patient in aftercare planning with referrals and resources, Facilitate patient progression through stages of change regarding substance use diagnoses and concerns and Identify triggers associated with mental health/substance abuse issues  Therapeutic Interventions: Assess for all discharge needs, 1 to 1 time with Social worker, Explore available resources and support systems, Assess for adequacy in community support network, Educate family and significant other(s) on suicide prevention, Complete Psychosocial Assessment, Interpersonal group therapy.  Evaluation of Outcomes: Progressing   Progress in Treatment: Attending groups: No. New to unit. Continuing to assess.  Participating in groups: No. Taking medication as prescribed: Yes. Toleration medication: Yes. Family/Significant other contact made: No, will contact:  family member if patient consents Patient understands diagnosis: Yes. Discussing patient identified problems/goals with staff: Yes. Medical problems stabilized or resolved: Yes. Denies suicidal/homicidal ideation: No. Passive SI/Able to contract for safety on the unit.  Issues/concerns per patient self-inventory: No. Other: n/a   New problem(s) identified: No, Describe:  n/a  New Short Term/Long Term Goal(s): detox; medication stabilization; elimination of  SI thoughts; development of comprehensive mental wellness/sobriety plan.   Discharge Plan or Barriers: CSW assessing for appropriate referrals.   Reason for Continuation of Hospitalization: Depression Medication stabilization Suicidal ideation Withdrawal symptoms  Estimated Length of Stay: 3-5 days   Attendees: Patient: 05/21/2016 8:15 AM  Physician: Dr. Jackquline Berlin MD 05/21/2016 8:15 AM  Nursing: Doristine Section RN 05/21/2016 8:15 AM  RN Care Manager: Onnie Boer CM 05/21/2016 8:15 AM  Social Worker: Trula Slade, LCSW 05/21/2016 8:15 AM  Recreational Therapist: Juliann Pares 05/21/2016 8:15 AM  Other: Armandina Stammer NP; May Augustin NP 05/21/2016 8:15 AM  Other:  05/21/2016 8:15 AM  Other: 05/21/2016 8:15 AM    Scribe for Treatment Team: Ledell Peoples Smart, LCSW 05/21/2016 8:15 AM

## 2016-05-21 NOTE — Progress Notes (Signed)
D   Pt is depressed and anxious  But brightens upon approach    He attended wrap up group and was appropriate   He interacts minimally but appropriately with others   Pt requested a sleep medication and said he hasnt been sleeping well since admission and before  A    Verbal support given   Medications administered and effectiveness monitored    Q 15 min checks  R   Pt is safe at present and receptive to verbal support

## 2016-05-21 NOTE — Progress Notes (Signed)
NUTRITION ASSESSMENT  Pt identified as at risk on the Malnutrition Screen Tool  INTERVENTION: 1. Supplements: Continue Ensure Enlive po BID, each supplement provides 350 kcal and 20 grams of protein  NUTRITION DIAGNOSIS: Unintentional weight loss related to sub-optimal intake as evidenced by pt report.   Goal: Pt to meet >/= 90% of their estimated nutrition needs.  Monitor:  PO intake  Assessment:  Pt admitted with depression, SI and substance abuse (ETOH, cocaine). Pt has lost 27 lb since August 2017 (12% wt loss x 4 months, significant for time frame). Pt has been ordered Ensure supplements, will continue order d/t recent weight loss.  Height: Ht Readings from Last 1 Encounters:  05/21/16  (1.905 m)    Weight: Wt Readings from Last 1 Encounters:  05/21/16 200 lb (90.7 kg)    Weight Hx: Wt Readings from Last 10 Encounters:  05/21/16 200 lb (90.7 kg)  05/20/16 201 lb (91.2 kg)  09/13/15 227 lb (103 kg)  04/08/15 232 lb 6.4 oz (105.4 kg)  02/19/15 225 lb (102.1 kg)  11/14/14 226 lb (102.5 kg)    BMI:  Body mass index is 25 kg/m. Pt meets criteria for normal based on current BMI.  Estimated Nutritional Needs: Kcal: 25-30 kcal/kg Protein: > 1 gram protein/kg Fluid: 1 ml/kcal  Diet Order: Diet regular Room service appropriate? Yes; Fluid consistency: Thin Pt is also offered choice of unit snacks mid-morning and mid-afternoon.  Pt is eating as desired.   Lab results and medications reviewed.   Tilda Franco, MS, RD, LDN Pager: [phone] After Hours Pager: [phone]

## 2016-05-21 NOTE — [facility] Suicide Risk Assessment (Signed)
[facility] INPATIENT:  Family/Significant Other Suicide Prevention Education  Suicide Prevention Education:  Education Completed; Florentina Addison, Pt's girlfriend [phone], has been identified by the patient as the family member/significant other with whom the patient will be residing, and identified as the person(s) who will aid the patient in the event of a mental health crisis (suicidal ideations/suicide attempt).  With written consent from the patient, the family member/significant other has been provided the following suicide prevention education, prior to the and/or following the discharge of the patient.  The suicide prevention education provided includes the following:  Suicide risk factors  Suicide prevention and interventions  National Suicide Hotline telephone number  [facility] assessment telephone number  [facility] Emergency Assistance 911  [facility] and/or Residential Mobile Crisis Unit telephone number  Request made of family/significant other to:  Remove weapons (e.g., guns, rifles, knives), all items previously/currently identified as safety concern.    Remove drugs/medications (over-the-counter, prescriptions, illicit drugs), all items previously/currently identified as a safety concern.  The family member/significant other verbalizes understanding of the suicide prevention education information provided.  The family member/significant other agrees to remove the items of safety concern listed above.  Verdene Lennert 05/21/2016, 11:25 AM

## 2016-05-22 DIAGNOSIS — F1994 Other psychoactive substance use, unspecified with psychoactive substance-induced mood disorder: Secondary | ICD-10-CM

## 2016-05-22 DIAGNOSIS — I10 Essential (primary) hypertension: Secondary | ICD-10-CM

## 2016-05-22 DIAGNOSIS — F332 Major depressive disorder, recurrent severe without psychotic features: Principal | ICD-10-CM

## 2016-05-22 DIAGNOSIS — Z79899 Other long term (current) drug therapy: Secondary | ICD-10-CM

## 2016-05-22 DIAGNOSIS — F17210 Nicotine dependence, cigarettes, uncomplicated: Secondary | ICD-10-CM

## 2016-05-22 DIAGNOSIS — E118 Type 2 diabetes mellitus with unspecified complications: Secondary | ICD-10-CM

## 2016-05-22 DIAGNOSIS — F1490 Cocaine use, unspecified, uncomplicated: Secondary | ICD-10-CM

## 2016-05-22 MED ORDER — SERTRALINE HCL 50 MG PO TABS
50.0000 mg | ORAL_TABLET | Freq: Every day | ORAL | Status: DC
  Administered 2016-05-23 – 2016-05-24 (×2): 50 mg via ORAL
  Filled 2016-05-22 (×4): qty 1

## 2016-05-22 MED ORDER — HYDROXYZINE HCL 50 MG PO TABS
50.0000 mg | ORAL_TABLET | Freq: Four times a day (QID) | ORAL | Status: DC | PRN
  Administered 2016-05-22 – 2016-05-24 (×4): 50 mg via ORAL
  Filled 2016-05-22 (×4): qty 1

## 2016-05-22 NOTE — [facility] Group Notes (Signed)
Pt participated in Psychoeducational group. Pt discussed their relapse prevention plan. During this group pt's discussed behaviors they would like to change. Pt's discussed their triggers, warning signs of relapse behavior, and coping skills they can use to prevent relapse.  

## 2016-05-22 NOTE — [facility] Group Notes (Signed)
[facility] LCSW Group Therapy  05/22/2016 3:02 PM  Type of Therapy:  Group Therapy  Participation Level:  None-pt slept in chair during group and did not participate in group discussion. Pt continues to present as drowsy and states that he feels "loopy."   Summary of Progress/Problems: Feelings around Relapse. Group members discussed the meaning of relapse and shared personal stories of relapse, how it affected them and others, and how they perceived themselves during this time. Group members were encouraged to identify triggers, warning signs and coping skills used when facing the possibility of relapse. Social supports were discussed and explored in detail.   Bowie Doiron N Smart LCSW 05/22/2016, 3:02 PM

## 2016-05-22 NOTE — Progress Notes (Signed)
Per patient request, [facility] referral faxed. CSW is waiting for confirmation regarding pt's insurance provider.  Trula Slade, MSW, LCSW Clinical Social Worker 05/22/2016 10:30 AM

## 2016-05-22 NOTE — Progress Notes (Signed)
[facility] MD Progress Note  05/22/2016 2:29 PM Timothy Mcmillan  MRN:  [id number] Subjective:   61 yo AAM, unemployed, lives alone, divorced. Background history of SUD. Self presented to the ER on account of chest pain and headache. Was intoxicated with cocaine at presentation. Reported worsening suicidal thoughts at presentation. Had thoughts of overdosing on street drugs and then crash his car. Reports strain in his relationship as a main stressor. UDS was positive for cocaine. He has been worked up medically and is stable.  Nursing staff reports that he has been isolating self today. He did not go to the yard earlier today. He has been in his room most of the time. No behavioral issues.  Seen today. Chart reviewed prior to interview. Says he spoke with his girlfriend yesterday. She has called their relationship off. Patient says he feels very frustrated and anxious. Says all he has been thinking about is to get some drugs. Requesting for the highest dose of vistaril or Thorazine. Says he feels like exploding. Very critical of others and the groups. Says he is glad he was in here when his girlfriend called off the relationship. Says he is not sure he would been able to cope out there. Ruminating a lot about all the negative things that has happened in his life. Showed me things he hoped she could help him work on. Says now he knows he is alone. We discussed focusing on his recovery. Patient felt better after venting.   Principal Problem: Substance induced mood disorder (HCC) Diagnosis:   Patient Active Problem List   Diagnosis Date Noted  . MDD (major depressive disorder), recurrent severe, without psychosis (HCC) [F33.2] 05/21/2016  . Substance induced mood disorder (HCC) [F19.94] 10/07/2015  . Cocaine abuse [F14.10]   . Alcohol abuse [F10.10]   . Acute renal failure (HCC) [N17.9]   . Hypokalemia [E87.6] 11/14/2014  . Polysubstance abuse [F19.10] 11/14/2014  . Alcohol withdrawal delirium  (HCC) [F10.231] 11/14/2014  . CAP (community acquired pneumonia) [J18.9] 11/14/2014  . Alcohol withdrawal (HCC) [F10.239] 11/14/2014  . Acute renal failure (ARF) (HCC) [N17.9] 11/14/2014  . Acute encephalopathy [G93.40]    Total Time spent with patient: 20 minutes  Past Psychiatric History: As in H&P   Past Medical History:  Past Medical History:  Diagnosis Date  . Depression   . ETOH abuse   . Hypertension   . RSD (reflex sympathetic dystrophy)     Past Surgical History:  Procedure Laterality Date  . EYE SURGERY    . HERNIA REPAIR    . surgery to lower L leg     Family History:  Family History  Problem Relation Age of Onset  . Diabetes type II Father   . Stroke Father    Family Psychiatric  History: As in H&P Social History:  History  Alcohol Use  . Yes    Comment: intermittently     History  Drug Use  . Types: Cocaine, "Crack" cocaine    Social History   Social History  . Marital status: Legally Separated    Spouse name: N/A  . Number of children: N/A  . Years of education: N/A   Social History Main Topics  . Smoking status: Current Every Day Smoker    Packs/day: 1.00    Types: Cigarettes  . Smokeless tobacco: Never Used     Comment: declined  . Alcohol use Yes     Comment: intermittently  . Drug use: Yes    Types: Cocaine, "Crack" cocaine  .  Sexual activity: Not Currently    Birth control/ protection: None   Other Topics Concern  . None   Social History Narrative  . None   Additional Social History:    Pain Medications: See home med list Prescriptions: See home med list Over the Counter: See home med list History of alcohol / drug use?: Yes Longest period of sobriety (when/how long): 3 years Negative Consequences of Use: Personal relationships Name of Substance 1: cocaine 1 - Age of First Use: teens 1 - Amount (size/oz): varies 1 - Frequency: occasionally 1 - Duration: ongoing 1 - Last Use / Amount: unk- was +cocaine in the ED                   Sleep: Good  Appetite:  Good  Current Medications: Current Facility-Administered Medications  Medication Dose Route Frequency Provider Last Rate Last Dose  . acetaminophen (TYLENOL) tablet 650 mg  650 mg Oral Q6H PRN Beau Fanny, FNP      . alum & mag hydroxide-simeth (MAALOX/MYLANTA) 200-200-20 MG/5ML suspension 30 mL  30 mL Oral Q4H PRN Beau Fanny, FNP      . amLODipine (NORVASC) tablet 5 mg  5 mg Oral Daily Beau Fanny, FNP      . feeding supplement (ENSURE ENLIVE) (ENSURE ENLIVE) liquid 237 mL  237 mL Oral BID BM Kerry Hough, PA-C   237 mL at 05/22/16 0849  . gabapentin (NEURONTIN) capsule 300 mg  300 mg Oral TID Adonis Brook, NP   300 mg at 05/22/16 1223  . lisinopril (PRINIVIL,ZESTRIL) tablet 10 mg  10 mg Oral Daily Beau Fanny, FNP   10 mg at 05/22/16 0829  . magnesium hydroxide (MILK OF MAGNESIA) suspension 30 mL  30 mL Oral Daily PRN Beau Fanny, FNP      . nicotine (NICODERM CQ - dosed in mg/24 hours) patch 21 mg  21 mg Transdermal Daily Kerry Hough, PA-C   21 mg at 05/22/16 0831  . sertraline (ZOLOFT) tablet 25 mg  25 mg Oral Daily Adonis Brook, NP   25 mg at 05/22/16 0829  . tamsulosin (FLOMAX) capsule 0.4 mg  0.4 mg Oral BID Beau Fanny, FNP   0.4 mg at 05/22/16 0856  . traZODone (DESYREL) tablet 50 mg  50 mg Oral QHS,MR X 1 Jackelyn Poling, NP   50 mg at 05/21/16 2124    Lab Results:  Results for orders placed or performed during the hospital encounter of 05/20/16 (from the past 48 hour(s))  I-Stat Troponin, ED (not at [facility])     Status: None   Collection Time: 05/20/16  8:56 PM  Result Value Ref Range   Troponin i, poc 0.00 0.00 - 0.08 ng/mL   Comment 3            Comment: Due to the release kinetics of cTnI, a negative result within the first hours of the onset of symptoms does not rule out myocardial infarction with certainty. If myocardial infarction is still suspected, repeat the test at appropriate intervals.      Blood Alcohol level:  Lab Results  Component Value Date   ETH <5 05/20/2016   ETH <5 12/10/2015    Metabolic Disorder Labs: Lab Results  Component Value Date   HGBA1C 5.7 04/08/2015   No results found for: PROLACTIN No results found for: CHOL, TRIG, HDL, CHOLHDL, VLDL, LDLCALC  Physical Findings: AIMS: Facial and Oral Movements Muscles of Facial Expression: None,  normal Lips and Perioral Area: None, normal Jaw: None, normal Tongue: None, normal,Extremity Movements Upper (arms, wrists, hands, fingers): None, normal Lower (legs, knees, ankles, toes): None, normal, Trunk Movements Neck, shoulders, hips: None, normal, Overall Severity Severity of abnormal movements (highest score from questions above): None, normal Incapacitation due to abnormal movements: None, normal Patient's awareness of abnormal movements (rate only patient's report): No Awareness, Dental Status Current problems with teeth and/or dentures?: No Does patient usually wear dentures?: No  CIWA:  CIWA-Ar Total: 2 COWS:     Musculoskeletal: Strength & Muscle Tone: within normal limits Gait & Station: normal Patient leans: N/A  Psychiatric Specialty Exam: Physical Exam  Constitutional: He is oriented to person, place, and time. He appears well-developed and well-nourished.  HENT:  Head: Normocephalic and atraumatic.  Eyes: Conjunctivae and EOM are normal. Pupils are equal, round, and reactive to light.  Neck: Normal range of motion. Neck supple.  Cardiovascular: Normal rate and regular rhythm.   Respiratory: Effort normal and breath sounds normal.  GI: Soft. Bowel sounds are normal.  Neurological: He is alert and oriented to person, place, and time. He has normal reflexes.  Skin: Skin is warm and dry.  Psychiatric:  As above     ROS  Blood pressure (!) 127/54, pulse 83, temperature 97.7 F (36.5 C), temperature source Oral, resp. rate 18, height  (1.905 m), weight 90.7 kg (200 lb), SpO2 100  %.Body mass index is 25 kg/m.  General Appearance: Came in with papers. Upset but was able to cope during interview. Not internally distracted.   Eye Contact:  Good  Speech:  Clear and Coherent  Volume:  Normal  Mood:  Angry, Anxious and Irritable  Affect:  Blunted and mood congruent  Thought Process:  Linear  Orientation:  Full (Time, Place, and Person)  Thought Content:  Negative ruminations. No delusional theme. No preoccupation with violent thoughts. No obsession.  No hallucination in any modality.   Suicidal Thoughts:  No  Homicidal Thoughts:  No  Memory:  Unable to assess at this time  Judgement:  Poor  Insight:  Shallow  Psychomotor Activity:  Normal  Concentration:  Attention Span: Good  Recall:  Good  Fund of Knowledge:  Good  Language:  Good  Akathisia:  No  Handed:    AIMS (if indicated):     Assets:  Communication Skills Housing  ADL's:  Intact  Cognition:  WNL  Sleep:  Number of Hours: 6.25     Treatment Plan Summary: Patient is mourning the end of his current relationship. He is also coming of cocaine. Combination of these factors is very distressing. He is very vulnerable at this time. We plan to provide emotional support and improve his coping skills.   Psychiatric: SUD Substance Induced Mood Disorder  Medical: HTN DM  Psychosocial:  Unemployed Strained relationship  PLAN: 1. Increase Sertraline to 50 mg daily 2. Hydroxyzine 50 mg Q6H PRN 3. Continue to monitor mood, behavior and interaction with peers. 4. Encourage groups and unit activities.   Georgiann Cocker, MD 05/22/2016, 2:29 PM

## 2016-05-22 NOTE — Progress Notes (Signed)
Date:  05/22/2016 Time:  1:09 AM  Group Topic/Focus:  Wrap-Up Group:   The focus of this group is to help patients review their daily goal of treatment and discuss progress on daily workbooks.  Participation Level:  Active  Participation Quality:  Appropriate and Attentive  Affect:  Appropriate  Cognitive:  Appropriate  Insight: Appropriate  Engagement in Group:  Engaged  Modes of Intervention:  Discussion  Additional Comments:  Pt stated his goal was to meet with the doctor and to stay distracted from his thoughts without having to get high, which he accomplished.   [name] 05/22/2016, 1:09 AM

## 2016-05-22 NOTE — Plan of Care (Signed)
Problem: Education: Goal: Utilization of techniques to improve thought processes will improve Outcome: Progressing Nurse discussed depression/coping skills/anxiety with patient.

## 2016-05-22 NOTE — Progress Notes (Signed)
D   Pt is depressed and anxious  But brightens upon approach    He attended wrap up group and was appropriate   He interacts minimally but appropriately with others  He has been present on the milieu but has little interaction A    Verbal support given   Medications administered and effectiveness monitored    Q 15 min checks  R   Pt is safe at present and receptive to verbal support

## 2016-05-22 NOTE — Progress Notes (Signed)
D:  Patient's self inventory sheet, patient has fair sleep, sleep medication helpful.  Fair appetite, low energy level, poor concentration.  Rated depression 9, hopeless 8, anxiety 10.  Denied withdrawals. SI, contracts for safety, no plan.  Denied physical problems.  Denied pain.  Goal is focus, concentration, limit anxiety.  Plans to medicate, talk.  No discharge plan. A:  Medications administered per MD orders.  Emotional support and encouragement given patient. R:  Denied SI and HI while talking to nurse today, contracts for safety, no plans.  Denied A/V hallucinations.  Safety maintained with 15 minute checks.

## 2016-05-22 NOTE — Progress Notes (Signed)
Pt attended the evening AA speaker meeting. Pt was engaged and appropriate. London Tarnowski C, NT 05/22/16 10:00 PM  

## 2016-05-22 NOTE — Progress Notes (Signed)
Recreation Therapy Notes  Date: 05/22/16 Time: 0930 Location: [address] Dayroom  Group Topic: Stress Management  Goal Area(s) Addresses:  Patient will verbalize importance of using healthy stress management.  Patient will identify positive emotions associated with healthy stress management.   Behavioral Response: Engaged  Intervention: Stress Management  Activity :  [place].  LRT introduced the stress management technique of guided imagery.  LRT read a script to allow patients to engage in the activity.  Patients were to follow along as LRT read the script to participate in activity.  Education:  Stress Management, Discharge Planning.   Education Outcome: Acknowledges edcuation/In group clarification offered/Needs additional education  Clinical Observations/Feedback: Pt attended group.   Caroll Rancher, LRT/CTRS         Caroll Rancher A 05/22/2016 11:55 AM

## 2016-05-23 NOTE — Progress Notes (Signed)
Data. Patient denies HI/AVH. Patient does endorse SI, but denies a plan for [facility]. Is able to verbally contract for safety while on the unit and agrees to come to staff before acting on any self harm thoughts/feelings. Patient spent the day in the day room, but did not interact with patients and did not initiate any interactions with staff. He mostly slept with his head proped against the wall. Ate minimally. Did take his nutritional shakes. Affect very flat and does not brighten. On his self assessment patient reports 8/10 for anxiety, depression and hopelessness. His goal for today is, "Focus. Concentration." Action. Emotional support and encouragement offered. Education provided on medication, indications and side effect. Q 15 minute checks done for safety. Response. Safety on the unit maintained through 15 minute checks.  Medications taken as prescribed. Attended groups. Remained calm and appropriate through out shift.

## 2016-05-23 NOTE — [facility] Group Notes (Signed)
[facility] Group Notes:  (Clinical Social Work)  05/23/2016  10:00-11:00AM  Summary of Progress/Problems:   Today's process group involved patients discussing their feelings related to being hospitalized and what they would be doing instead if at home.  This led to talking about how to stay well and therefore out of the hospital.  Among the activities listed were taking medications on time, finding hobbies to pursue that provide a source of fun, talking with a professional counselor to vent feelings and figure out solutions to problems, and more.  Patient stated today, if not in the hospital, he would be seeking alcohol or drugs to get away from his thoughts, or else seeking treatment to get away from alcohol and drugs.  He kept his eyes shut throughout group, was offered the opportunity to return to his room but did not do so, did not contribute to discussion.  Type of Therapy:  Group Therapy - Process  Participation Level:  Minimal  Participation Quality:  Drowsy  Affect:  Depressed and Flat  Cognitive:  Appropriate  Insight:  Improving  Engagement in Therapy:  Limited  Modes of Intervention:  Exploration, Discussion  [name], LCSW 05/23/2016, 12:46 PM

## 2016-05-23 NOTE — [facility] Group Notes (Signed)
[facility] Group Notes:  (Nursing/MHT/Case Management/Adjunct)  Date:  05/23/2016  Time:  3:37 PM  Type of Therapy:  Nurse Education  Participation Level:  Active  Participation Quality:  Appropriate  Affect:  Depressed  Cognitive:  Alert and Oriented  Insight:  Appropriate  Engagement in Group:  Engaged  Modes of Intervention:  Activity and mindfulness meditation  Summary of Progress/Problems: Mindfulness meditation practice, patient participated appropriately throughout.   Vinetta Bergamo Syndi Pua 05/23/2016, 3:37 PM

## 2016-05-23 NOTE — Plan of Care (Signed)
Problem: Education: Goal: Knowledge of the prescribed therapeutic regimen will improve Outcome: Progressing Patient was proactive in his medication administration this shift.

## 2016-05-23 NOTE — Progress Notes (Addendum)
[facility] MD Progress Note  05/23/2016 2:45 PM Timothy Mcmillan  MRN:  [id number] Subjective:   61 yo AAM, unemployed, lives alone, divorced. Background history of SUD. Self presented to the ER on account of chest pain and headache. Was intoxicated with cocaine at presentation. Reported worsening suicidal thoughts at presentation. Had thoughts of overdosing on street drugs and then crash his car. Reports strain in his relationship as a main stressor. UDS was positive for cocaine. He has been worked up medically and is stable.  Seen today. Chart reviewed prior to interview. Sitting at the day room. Says he is just trying to stay busy. Still upset with his ex-girlfriend. Still craving for drugs and alcohol. Says that is how he has dealt with these types of situation over the years. No suicidal thoughts.   Nursing staff notes that he has reported craving for drugs. Moderate group participation. Tolerating recent dose adjustment well.   Principal Problem: Substance induced mood disorder (HCC) Diagnosis:   Patient Active Problem List   Diagnosis Date Noted  . MDD (major depressive disorder), recurrent severe, without psychosis (HCC) [F33.2] 05/21/2016  . Substance induced mood disorder (HCC) [F19.94] 10/07/2015  . Cocaine abuse [F14.10]   . Alcohol abuse [F10.10]   . Acute renal failure (HCC) [N17.9]   . Hypokalemia [E87.6] 11/14/2014  . Polysubstance abuse [F19.10] 11/14/2014  . Alcohol withdrawal delirium (HCC) [F10.231] 11/14/2014  . CAP (community acquired pneumonia) [J18.9] 11/14/2014  . Alcohol withdrawal (HCC) [F10.239] 11/14/2014  . Acute renal failure (ARF) (HCC) [N17.9] 11/14/2014  . Acute encephalopathy [G93.40]    Total Time spent with patient: 20 minutes  Past Psychiatric History: As in H&P   Past Medical History:  Past Medical History:  Diagnosis Date  . Depression   . ETOH abuse   . Hypertension   . RSD (reflex sympathetic dystrophy)     Past Surgical History:   Procedure Laterality Date  . EYE SURGERY    . HERNIA REPAIR    . surgery to lower L leg     Family History:  Family History  Problem Relation Age of Onset  . Diabetes type II Father   . Stroke Father    Family Psychiatric  History: As in H&P Social History:  History  Alcohol Use  . Yes    Comment: intermittently     History  Drug Use  . Types: Cocaine, "Crack" cocaine    Social History   Social History  . Marital status: Legally Separated    Spouse name: N/A  . Number of children: N/A  . Years of education: N/A   Social History Main Topics  . Smoking status: Current Every Day Smoker    Packs/day: 1.00    Types: Cigarettes  . Smokeless tobacco: Never Used     Comment: declined  . Alcohol use Yes     Comment: intermittently  . Drug use: Yes    Types: Cocaine, "Crack" cocaine  . Sexual activity: Not Currently    Birth control/ protection: None   Other Topics Concern  . None   Social History Narrative  . None   Additional Social History:    Pain Medications: See home med list Prescriptions: See home med list Over the Counter: See home med list History of alcohol / drug use?: Yes Longest period of sobriety (when/how long): 3 years Negative Consequences of Use: Personal relationships Name of Substance 1: cocaine 1 - Age of First Use: teens 1 - Amount (size/oz): varies 1 - Frequency:  occasionally 1 - Duration: ongoing 1 - Last Use / Amount: unk- was +cocaine in the ED                  Sleep: Good  Appetite:  Good  Current Medications: Current Facility-Administered Medications  Medication Dose Route Frequency Provider Last Rate Last Dose  . acetaminophen (TYLENOL) tablet 650 mg  650 mg Oral Q6H PRN Beau Fanny, FNP      . alum & mag hydroxide-simeth (MAALOX/MYLANTA) 200-200-20 MG/5ML suspension 30 mL  30 mL Oral Q4H PRN Beau Fanny, FNP      . amLODipine (NORVASC) tablet 5 mg  5 mg Oral Daily Beau Fanny, FNP      . feeding  supplement (ENSURE ENLIVE) (ENSURE ENLIVE) liquid 237 mL  237 mL Oral BID BM Kerry Hough, PA-C   237 mL at 05/23/16 0958  . gabapentin (NEURONTIN) capsule 300 mg  300 mg Oral TID Adonis Brook, NP   300 mg at 05/23/16 1306  . hydrOXYzine (ATARAX/VISTARIL) tablet 50 mg  50 mg Oral Q6H PRN Georgiann Cocker, MD   50 mg at 05/22/16 1515  . lisinopril (PRINIVIL,ZESTRIL) tablet 10 mg  10 mg Oral Daily Beau Fanny, FNP   10 mg at 05/23/16 0747  . magnesium hydroxide (MILK OF MAGNESIA) suspension 30 mL  30 mL Oral Daily PRN Beau Fanny, FNP      . nicotine (NICODERM CQ - dosed in mg/24 hours) patch 21 mg  21 mg Transdermal Daily Kerry Hough, PA-C   21 mg at 05/23/16 0746  . sertraline (ZOLOFT) tablet 50 mg  50 mg Oral Daily Georgiann Cocker, MD   50 mg at 05/23/16 0746  . tamsulosin (FLOMAX) capsule 0.4 mg  0.4 mg Oral BID Beau Fanny, FNP   0.4 mg at 05/23/16 0747  . traZODone (DESYREL) tablet 50 mg  50 mg Oral QHS,MR X 1 Jackelyn Poling, NP   50 mg at 05/21/16 2124    Lab Results:  No results found for this or any previous visit (from the past 48 hour(s)).  Blood Alcohol level:  Lab Results  Component Value Date   ETH <5 05/20/2016   ETH <5 12/10/2015    Metabolic Disorder Labs: Lab Results  Component Value Date   HGBA1C 5.7 04/08/2015   No results found for: PROLACTIN No results found for: CHOL, TRIG, HDL, CHOLHDL, VLDL, LDLCALC  Physical Findings: AIMS: Facial and Oral Movements Muscles of Facial Expression: None, normal Lips and Perioral Area: None, normal Jaw: None, normal Tongue: None, normal,Extremity Movements Upper (arms, wrists, hands, fingers): None, normal Lower (legs, knees, ankles, toes): None, normal, Trunk Movements Neck, shoulders, hips: None, normal, Overall Severity Severity of abnormal movements (highest score from questions above): None, normal Incapacitation due to abnormal movements: None, normal Patient's awareness of abnormal movements  (rate only patient's report): No Awareness, Dental Status Current problems with teeth and/or dentures?: No Does patient usually wear dentures?: No  CIWA:  CIWA-Ar Total: 1 COWS:  COWS Total Score: 1  Musculoskeletal: Strength & Muscle Tone: within normal limits Gait & Station: normal Patient leans: N/A  Psychiatric Specialty Exam: Physical Exam  Constitutional: He is oriented to person, place, and time. He appears well-developed and well-nourished.  HENT:  Head: Normocephalic and atraumatic.  Eyes: Conjunctivae and EOM are normal. Pupils are equal, round, and reactive to light.  Neck: Normal range of motion. Neck supple.  Cardiovascular: Normal rate and regular  rhythm.   Respiratory: Effort normal and breath sounds normal.  GI: Soft. Bowel sounds are normal.  Neurological: He is alert and oriented to person, place, and time. He has normal reflexes.  Skin: Skin is warm and dry.  Psychiatric:  As above     ROS  Blood pressure (!) 143/84, pulse 78, temperature 98 F (36.7 C), resp. rate 18, height  (1.905 m), weight 90.7 kg (200 lb), SpO2 100 %.Body mass index is 25 kg/m.  General Appearance: Came in with papers. Upset but was able to cope during interview. Not internally distracted.   Eye Contact:  Good  Speech:  Clear and Coherent  Volume:  Normal  Mood:  Angry, Anxious and Irritable  Affect:  Blunted and mood congruent  Thought Process:  Linear  Orientation:  Full (Time, Place, and Person)  Thought Content:  Negative ruminations. No delusional theme. No preoccupation with violent thoughts. No obsession.  No hallucination in any modality.   Suicidal Thoughts:  No  Homicidal Thoughts:  No  Memory:  Unable to assess at this time  Judgement:  Poor  Insight:  Shallow  Psychomotor Activity:  Normal  Concentration:  Attention Span: Good  Recall:  Good  Fund of Knowledge:  Good  Language:  Good  Akathisia:  No  Handed:    AIMS (if indicated):     Assets:   Communication Skills Housing  ADL's:  Intact  Cognition:  WNL  Sleep:  Number of Hours: 6.5     Treatment Plan Summary: Patient is mourning the end of his current relationship. He is also coming of cocaine. Combination of these factors is very distressing. He is very vulnerable at this time. We plan to provide emotional support and improve his coping skills.   Psychiatric: SUD Substance Induced Mood Disorder  Medical: HTN DM  Psychosocial:  Unemployed Strained relationship  PLAN: 1. Continue current regimen 2. Continue to monitor mood, behavior and interaction with peers. 3. Encourage groups and unit activities.   Georgiann Cocker, MD 05/23/2016, 2:45 PMPatient ID: Loralee Pacas, male   DOB: 04/12/1955, 61 y.o.   MRN: [id number]

## 2016-05-23 NOTE — Progress Notes (Signed)
Patient attended wrap-up group,but did not participate.  

## 2016-05-24 MED ORDER — GABAPENTIN 400 MG PO CAPS
400.0000 mg | ORAL_CAPSULE | Freq: Three times a day (TID) | ORAL | Status: DC
  Administered 2016-05-24 – 2016-05-28 (×9): 400 mg via ORAL
  Filled 2016-05-24 (×3): qty 1
  Filled 2016-05-24: qty 63
  Filled 2016-05-24 (×6): qty 1
  Filled 2016-05-24 (×2): qty 63
  Filled 2016-05-24: qty 1
  Filled 2016-05-24: qty 63
  Filled 2016-05-24 (×2): qty 1
  Filled 2016-05-24: qty 63
  Filled 2016-05-24 (×3): qty 1

## 2016-05-24 MED ORDER — SERTRALINE HCL 100 MG PO TABS
100.0000 mg | ORAL_TABLET | Freq: Every day | ORAL | Status: DC
  Administered 2016-05-25 – 2016-05-28 (×4): 100 mg via ORAL
  Filled 2016-05-24: qty 21
  Filled 2016-05-24 (×4): qty 1
  Filled 2016-05-24: qty 21
  Filled 2016-05-24: qty 1

## 2016-05-24 MED ORDER — HYDROXYZINE HCL 25 MG PO TABS
25.0000 mg | ORAL_TABLET | Freq: Four times a day (QID) | ORAL | Status: DC | PRN
  Administered 2016-05-24 – 2016-05-26 (×6): 25 mg via ORAL
  Filled 2016-05-24 (×6): qty 1

## 2016-05-24 NOTE — Plan of Care (Signed)
Problem: Activity: Goal: Interest or engagement in leisure activities will improve Outcome: Not Progressing Patient continue to show no initiation with peers and staff. Sits by himself in the dayroom, with no initiation. Attends groups, but often sleeps through them with no participation.

## 2016-05-24 NOTE — [facility] Group Notes (Signed)
[facility] Group Notes:  (Nursing/MHT/Case Management/Adjunct)  Date:  05/24/2016  Time:  2:07 PM  Type of Therapy:  Nurse Education  Participation Level:  None  Participation Quality:  Drowsy, Inattentive and Resistant  Affect:  Lethargic and Resistant  Cognitive:  Lacking  Insight:  Lacking  Engagement in Group:  Lacking and Resistant  Modes of Intervention:  Activity  Summary of Progress/Problems: Activity focusing on positive aspects of others, and accepting others positive thoughts about self. Patient slept, sitting in group and did not participate.   Almira Bar 05/24/2016, 2:07 PM

## 2016-05-24 NOTE — Progress Notes (Signed)
Data. Patient denies HI/AVH.  Continues to endorse SI. Denies a concrete plan and verbally agrees to stay safe on the unit and to come to staff if he feels unsafe with the SI thoughts/feelings. Patient's affect is flat and only brightens very minimally with interaction. Patient had a, "Panic attack" in the AM. Patient was able to calm down with staff assist and PRN medication. Patient has been isolating in the dayroom. He sleeps with his head proped on the wall and does not initiate interaction with either peers or staff.  On his self assessment patient reports 8/10 for depression and hopelessness and 10/10 for anxiety.His goal for today is, "Focus, concentration and anxiety." Action. Emotional support and encouragement offered. Education provided on medication, indications and side effect. Q 15 minute checks done for safety. Response. Safety on the unit maintained through 15 minute checks.  Medications taken as prescribed. Attended groups. Remained calm and appropriate through out shift.

## 2016-05-24 NOTE — Progress Notes (Signed)
Adult Psychoeducational Group Note  Date:  05/24/2016 Time:  9:33 PM  Group Topic/Focus:  Wrap-Up Group:   The focus of this group is to help patients review their daily goal of treatment and discuss progress on daily workbooks.  Participation Level:  Active  Participation Quality:  Appropriate  Affect:  Appropriate  Cognitive:  Alert  Insight: Appropriate  Engagement in Group:  Engaged  Modes of Intervention:  Discussion  Additional Comments:  Patient stated, "My day was better than yesterday. Patient's goal for today was to be more focused.   Sonnet Rizor L Linna Thebeau 05/24/2016, 9:33 PM

## 2016-05-24 NOTE — Progress Notes (Signed)
Nursing Progress Note 7p-7a  D) Patient presents flat and depressed. Patient is minimal with Clinical research associate but requests his medications. Patient states "I still have thoughts to harm myself, but I have no plans to act on them". Patient is seen interactive in the milieu. Patient denies HI/AVH or pain. Patient contracts for safety on the unit. Patient did attend group.  A) Emotional support given. 1:1 interaction and active listening provided. Patient medicated with PM orders as prescribed. Medications reviewed with patient. Patient verbalized understanding of medications without further questions.  Snacks and fluids provided. Opportunities for questions or concerns presented to patient. Patient encouraged to continue to work on treatment goals. Labs, vital signs and patient behavior monitored throughout shift. Patient safety maintained with q15 min safety checks.  R) Patient receptive to interaction with nurse. Patient remains safe on the unit at this time. Patient denies any adverse medication reactions at this time. Patient is resting in bed without complaints. Will continue to monitor.

## 2016-05-24 NOTE — [facility] Group Notes (Signed)
[facility] Group Notes:  (Clinical Social Work)  05/24/2016  10:00AM-11:00AM  Summary of Progress/Problems:  The main focus of today's process group was to listen to a variety of genres of music and to identify that different types of music provoke different responses.  The patient then was able to identify personally what was soothing for them, as well as energizing, as well as how patient can personally use this knowledge in sleep habits, with depression, and with other symptoms.  The patient expressed at the beginning of group the overall feeling of "not good, depressed and angry, having an anxiety attack."  He stated that he wished he could throw the radio out the window.  He nonetheless did stay for the music, at times brightening and at others crying.  He indicated to CSW several times when a song was something he could not listen to, and CSW changed it.  At the end of group he would not answer whether he was still having an anxiety attack, but he talked at length in a calm voice about how if he is in the middle of turmoil, as he is currently, he does not need to listen to music because it does not allow him to focus on his mind and making decisions about what is going on.  Type of Therapy:  Music Therapy   Participation Level:  Active  Participation Quality:  Attentive and Sharing  Affect:  Blunted  Cognitive:  Oriented  Insight:  Engaged  Engagement in Therapy:  Engaged  Modes of Intervention:   Activity, Exploration  [name], LCSW 05/24/2016

## 2016-05-24 NOTE — Progress Notes (Signed)
[facility] MD Progress Note  05/24/2016 2:21 PM Timothy Mcmillan  MRN:  [id number] Subjective:   61 yo AAM, unemployed, lives alone, divorced. Background history of SUD. Self presented to the ER on account of chest pain and headache. Was intoxicated with cocaine at presentation. Reported worsening suicidal thoughts at presentation. Had thoughts of overdosing on street drugs and then crash his car. Reports strain in his relationship as a main stressor. UDS was positive for cocaine. He has been worked up medically and is stable.  Nursing staff notes that he was irritable this morning. He was threatening to go off. He was requesting something that would knock him out. Patient was given PRN Vistaril. He has been relatively calmer. He has been at some of the unit groups and activities. He still reports off and on suicidal thoughts.   Seen today. Chart reviewed prior to interview. Has been calling inpatient rehab facilities. Says he hopes he can get into [facility]. Now interested in long term addiction treatment. Patient states that he has given up on his former relationship. He is now focused on dealing with his own issues. Still has periods of anger and anguish. This is precipitated by reminders of difficulties he has had with relationships. He had felt like using drugs or harm self. Says he is glad he is this protected environment. He hopes to explore options for rehab tomorrow.   Principal Problem: Substance induced mood disorder (HCC) Diagnosis:   Patient Active Problem List   Diagnosis Date Noted  . MDD (major depressive disorder), recurrent severe, without psychosis (HCC) [F33.2] 05/21/2016  . Substance induced mood disorder (HCC) [F19.94] 10/07/2015  . Cocaine abuse [F14.10]   . Alcohol abuse [F10.10]   . Acute renal failure (HCC) [N17.9]   . Hypokalemia [E87.6] 11/14/2014  . Polysubstance abuse [F19.10] 11/14/2014  . Alcohol withdrawal delirium (HCC) [F10.231] 11/14/2014  . CAP (community  acquired pneumonia) [J18.9] 11/14/2014  . Alcohol withdrawal (HCC) [F10.239] 11/14/2014  . Acute renal failure (ARF) (HCC) [N17.9] 11/14/2014  . Acute encephalopathy [G93.40]    Total Time spent with patient: 20 minutes  Past Psychiatric History: As in H&P   Past Medical History:  Past Medical History:  Diagnosis Date  . Depression   . ETOH abuse   . Hypertension   . RSD (reflex sympathetic dystrophy)     Past Surgical History:  Procedure Laterality Date  . EYE SURGERY    . HERNIA REPAIR    . surgery to lower L leg     Family History:  Family History  Problem Relation Age of Onset  . Diabetes type II Father   . Stroke Father    Family Psychiatric  History: As in H&P Social History:  History  Alcohol Use  . Yes    Comment: intermittently     History  Drug Use  . Types: Cocaine, "Crack" cocaine    Social History   Social History  . Marital status: Legally Separated    Spouse name: N/A  . Number of children: N/A  . Years of education: N/A   Social History Main Topics  . Smoking status: Current Every Day Smoker    Packs/day: 1.00    Types: Cigarettes  . Smokeless tobacco: Never Used     Comment: declined  . Alcohol use Yes     Comment: intermittently  . Drug use: Yes    Types: Cocaine, "Crack" cocaine  . Sexual activity: Not Currently    Birth control/ protection: None  [facility] Other  Topics Concern  . None   Social History Narrative  . None   Additional Social History:    Pain Medications: See home med list Prescriptions: See home med list Over the Counter: See home med list History of alcohol / drug use?: Yes Longest period of sobriety (when/how long): 3 years Negative Consequences of Use: Personal relationships Name of Substance 1: cocaine 1 - Age of First Use: teens 1 - Amount (size/oz): varies 1 - Frequency: occasionally 1 - Duration: ongoing 1 - Last Use / Amount: unk- was +cocaine in the ED                  Sleep: Good  Appetite:   Good  Current Medications: Current Facility-Administered Medications  Medication Dose Route Frequency Provider Last Rate Last Dose  . acetaminophen (TYLENOL) tablet 650 mg  650 mg Oral Q6H PRN Beau Fanny, FNP      . alum & mag hydroxide-simeth (MAALOX/MYLANTA) 200-200-20 MG/5ML suspension 30 mL  30 mL Oral Q4H PRN Beau Fanny, FNP      . amLODipine (NORVASC) tablet 5 mg  5 mg Oral Daily Beau Fanny, FNP      . feeding supplement (ENSURE ENLIVE) (ENSURE ENLIVE) liquid 237 mL  237 mL Oral BID BM Kerry Hough, PA-C   237 mL at 05/24/16 0811  . gabapentin (NEURONTIN) capsule 400 mg  400 mg Oral TID Georgiann Cocker, MD      . hydrOXYzine (ATARAX/VISTARIL) tablet 25 mg  25 mg Oral Q6H PRN Georgiann Cocker, MD      . lisinopril (PRINIVIL,ZESTRIL) tablet 10 mg  10 mg Oral Daily Beau Fanny, FNP   10 mg at 05/24/16 0809  . magnesium hydroxide (MILK OF MAGNESIA) suspension 30 mL  30 mL Oral Daily PRN Beau Fanny, FNP      . nicotine (NICODERM CQ - dosed in mg/24 hours) patch 21 mg  21 mg Transdermal Daily Kerry Hough, PA-C   21 mg at 05/24/16 0809  . [START ON 05/25/2016] sertraline (ZOLOFT) tablet 100 mg  100 mg Oral Daily Georgiann Cocker, MD      . tamsulosin (FLOMAX) capsule 0.4 mg  0.4 mg Oral BID Beau Fanny, FNP   0.4 mg at 05/24/16 0809  . traZODone (DESYREL) tablet 50 mg  50 mg Oral QHS,MR X 1 Jackelyn Poling, NP   50 mg at 05/21/16 2124    Lab Results:  No results found for this or any previous visit (from the past 48 hour(s)).  Blood Alcohol level:  Lab Results  Component Value Date   ETH <5 05/20/2016   ETH <5 12/10/2015    Metabolic Disorder Labs: Lab Results  Component Value Date   HGBA1C 5.7 04/08/2015   No results found for: PROLACTIN No results found for: CHOL, TRIG, HDL, CHOLHDL, VLDL, LDLCALC  Physical Findings: AIMS: Facial and Oral Movements Muscles of Facial Expression: None, normal Lips and Perioral Area: None, normal Jaw: None,  normal Tongue: None, normal,Extremity Movements Upper (arms, wrists, hands, fingers): None, normal Lower (legs, knees, ankles, toes): None, normal, Trunk Movements Neck, shoulders, hips: None, normal, Overall Severity Severity of abnormal movements (highest score from questions above): None, normal Incapacitation due to abnormal movements: None, normal Patient's awareness of abnormal movements (rate only patient's report): No Awareness, Dental Status Current problems with teeth and/or dentures?: No Does patient usually wear dentures?: No  CIWA:  CIWA-Ar Total: 1 COWS:  COWS Total  Score: 1  Musculoskeletal: Strength & Muscle Tone: within normal limits Gait & Station: normal Patient leans: N/A  Psychiatric Specialty Exam: Physical Exam  Constitutional: He is oriented to person, place, and time. He appears well-developed and well-nourished.  HENT:  Head: Normocephalic and atraumatic.  Eyes: Conjunctivae and EOM are normal. Pupils are equal, round, and reactive to light.  Neck: Normal range of motion. Neck supple.  Cardiovascular: Normal rate and regular rhythm.   Respiratory: Effort normal and breath sounds normal.  GI: Soft. Bowel sounds are normal.  Neurological: He is alert and oriented to person, place, and time. He has normal reflexes.  Skin: Skin is warm and dry.  Psychiatric:  As above     ROS  Blood pressure (!) 154/93, pulse 78, temperature 98 F (36.7 C), resp. rate 18, height  (1.905 m), weight 90.7 kg (200 lb), SpO2 100 %.Body mass index is 25 kg/m.  General Appearance: A bit sedated from effects of Vistaril. Calm and less irritable.   Eye Contact:  Moderate  Speech:  Clear and Coherent  Volume:  Normal  Mood:  Angry, Anxious and Irritable  Affect:  Blunted and mood congruent  Thought Process:  Linear  Orientation:  Full (Time, Place, and Person)  Thought Content:  Negative ruminations. No delusional theme. No preoccupation with violent thoughts. No  obsession.  No hallucination in any modality.   Suicidal Thoughts:  No  Homicidal Thoughts:  No  Memory:  Unable to assess at this time  Judgement:  Poor  Insight:  Shallow  Psychomotor Activity:  Normal  Concentration:  Attention Span: Good  Recall:  Good  Fund of Knowledge:  Good  Language:  Good  Akathisia:  No  Handed:    AIMS (if indicated):     Assets:  Communication Skills Housing  ADL's:  Intact  Cognition:  WNL  Sleep:  Number of Hours: 5.75     Treatment Plan Summary: Patient is still dysphoric. He has off and on suicidal thoughts. He is now focused on inpatient addiction treatment.    Psychiatric: SUD Substance Induced Mood Disorder  Medical: HTN DM  Psychosocial:  Unemployed Strained relationship  PLAN: 1. Increase Sertraline to 100 mg daily 2. Increase Gabapentin to 400 mg TID 3. Make PRN Hydroxyzine 25 mg as he feels it is too strong at 50 mg.  4. Continue to monitor mood, behavior and interaction with peers. 5. SW would facilitate inpatient rehab placement.   Georgiann Cocker, MD 05/24/2016, 2:21 PMPatient ID: Loralee Pacas, male   DOB: 1955-04-15, 61 y.o.   MRN: [id number] Patient ID: Jhan Conery, male   DOB: [birth year], 61 y.o.   MRN: [id number]

## 2016-05-24 NOTE — Plan of Care (Signed)
Problem: Activity: Goal: Interest or engagement in activities will improve Outcome: Progressing Patient attended group and is seen interactive in the dayroom.

## 2016-05-24 NOTE — Plan of Care (Signed)
Problem: Safety: Goal: Periods of time without injury will increase Outcome: Progressing Pt has not harmed self or others tonight.  He reports passive SI without a plan.  Pt denies HI.  Pt verbally contracts for safety.

## 2016-05-24 NOTE — Progress Notes (Signed)
D: Pt was in the dayroom upon initial approach.  Pt presents with depressed affect and depressed, anxious mood.  When asked how his day was, pt states "it sucked."  He reports he did not have a good day because "too much anxiety."  His goal is "to focus and concentrate."  Pt reports passive SI without a plan.  Denies HI, denies hallucinations, reports chronic left leg pain of 7/10.  Pt has been visible in milieu with few peer interactions.  Pt attended evening group.    A: Introduced self to pt.  Actively listened to pt and offered support and encouragement. Medications offered per order.  PRN medication administered for anxiety.  PRN medication for pain offered, pt declined.  Q15 minute safety checks maintained.   R: Pt is safe on the unit.  Pt refused scheduled Trazodone, reporting he does not need it.  Pt verbally contracts for safety.  Will continue to monitor and assess.

## 2016-05-25 DIAGNOSIS — I10 Essential (primary) hypertension: Secondary | ICD-10-CM | POA: Diagnosis present

## 2016-05-25 DIAGNOSIS — F1420 Cocaine dependence, uncomplicated: Secondary | ICD-10-CM | POA: Diagnosis present

## 2016-05-25 DIAGNOSIS — F1020 Alcohol dependence, uncomplicated: Secondary | ICD-10-CM | POA: Diagnosis present

## 2016-05-25 MED ORDER — HYDROXYZINE HCL 10 MG PO TABS
10.0000 mg | ORAL_TABLET | Freq: Three times a day (TID) | ORAL | Status: DC | PRN
  Administered 2016-05-26 (×2): 10 mg via ORAL
  Filled 2016-05-25 (×2): qty 1

## 2016-05-25 MED ORDER — AMLODIPINE BESYLATE 10 MG PO TABS: 10.0000 mg | ORAL_TABLET | Freq: Every day | ORAL | Status: DC

## 2016-05-25 MED ORDER — CLONIDINE HCL 0.1 MG PO TABS
0.1000 mg | ORAL_TABLET | Freq: Two times a day (BID) | ORAL | Status: DC | PRN
Start: 2016-05-25 — End: 2016-05-28

## 2016-05-25 MED ORDER — LISINOPRIL 20 MG PO TABS
20.0000 mg | ORAL_TABLET | Freq: Every day | ORAL | Status: DC
  Administered 2016-05-26 – 2016-05-28 (×3): 20 mg via ORAL
  Filled 2016-05-25: qty 21
  Filled 2016-05-25 (×4): qty 1
  Filled 2016-05-25: qty 21
  Filled 2016-05-25: qty 1

## 2016-05-25 MED ORDER — AMLODIPINE BESYLATE 5 MG PO TABS
5.0000 mg | ORAL_TABLET | Freq: Every day | ORAL | Status: DC
  Administered 2016-05-26 – 2016-05-28 (×3): 5 mg via ORAL
  Filled 2016-05-25 (×3): qty 1
  Filled 2016-05-25 (×2): qty 21
  Filled 2016-05-25: qty 1

## 2016-05-25 NOTE — Progress Notes (Addendum)
[facility] MD Progress Note  05/26/2016 2:14 PM Timothy Mcmillan  MRN:  [id number] Subjective:  Patient states "I am a bit paranoid , I still feel suicidal. I was so desperate when I came here for help. I was having suicidal thoughts, and I wanted to get here before I crashed my car. I deal with a lot of abandonment issues , my daughters do not want me, I abandoned them when they were growing up . I also broke up with my girlfriend . I relapsed and I really want to get help."    Objective:Patient seen and chart reviewed.Discussed patient with treatment team.  Pt today seen as depressed, continues to have anxiety sx, is tearful often. Pt also reports paranoia , which he did not have for a long time. He reports zyprexa helped in the past and wants to try it again. CSW will continue to work on placement , pt declined [facility] today , he is hopeful [facility].Marland Kitchen He is compliant on them, denies ADRs. Pt continues to be motivated to go to a substance abuse treatment program.       Principal Problem: MDD (major depressive disorder), recurrent severe, without psychosis (HCC) Diagnosis:   Patient Active Problem List   Diagnosis Date Noted  . Cocaine use disorder, severe, dependence (HCC) [F14.20] 05/25/2016  . Alcohol use disorder, moderate, dependence (HCC) [F10.20] 05/25/2016  . Essential hypertension [I10] 05/25/2016  . MDD (major depressive disorder), recurrent severe, without psychosis (HCC) [F33.2] 05/21/2016  . Substance induced mood disorder (HCC) [F19.94] 10/07/2015  . Cocaine abuse [F14.10]   . Alcohol abuse [F10.10]   . Acute renal failure (HCC) [N17.9]   . Hypokalemia [E87.6] 11/14/2014  . Polysubstance abuse [F19.10] 11/14/2014  . Alcohol withdrawal delirium (HCC) [F10.231] 11/14/2014  . CAP (community acquired pneumonia) [J18.9] 11/14/2014  . Alcohol withdrawal (HCC) [F10.239] 11/14/2014  . Acute renal failure (ARF) (HCC) [N17.9] 11/14/2014  . Acute  encephalopathy [G93.40]    Total Time spent with patient: 25 minutes  Past Psychiatric History: As in H&P   Past Medical History:  Past Medical History:  Diagnosis Date  . Depression   . ETOH abuse   . Hypertension   . RSD (reflex sympathetic dystrophy)     Past Surgical History:  Procedure Laterality Date  . EYE SURGERY    . HERNIA REPAIR    . surgery to lower L leg     Family History:  Family History  Problem Relation Age of Onset  . Diabetes type II Father   . Stroke Father    Family Psychiatric  History: As in H&P Social History:  History  Alcohol Use  . Yes    Comment: intermittently     History  Drug Use  . Types: Cocaine, "Crack" cocaine    Social History   Social History  . Marital status: Legally Separated    Spouse name: N/A  . Number of children: N/A  . Years of education: N/A   Social History Main Topics  . Smoking status: Current Every Day Smoker    Packs/day: 1.00    Types: Cigarettes  . Smokeless tobacco: Never Used     Comment: declined  . Alcohol use Yes     Comment: intermittently  . Drug use: Yes    Types: Cocaine, "Crack" cocaine  . Sexual activity: Not Currently    Birth control/ protection: None   Other Topics Concern  . None   Social History Narrative  .  None   Additional Social History:    Pain Medications: See home med list Prescriptions: See home med list Over the Counter: See home med list History of alcohol / drug use?: Yes Longest period of sobriety (when/how long): 3 years Negative Consequences of Use: Personal relationships Name of Substance 1: cocaine 1 - Age of First Use: teens 1 - Amount (size/oz): varies 1 - Frequency: occasionally 1 - Duration: ongoing 1 - Last Use / Amount: unk- was +cocaine in the ED                  Sleep: Fair  Appetite:  Fair  Current Medications: Current Facility-Administered Medications  Medication Dose Route Frequency Provider Last Rate Last Dose  . acetaminophen  (TYLENOL) tablet 650 mg  650 mg Oral Q6H PRN Beau Fanny, FNP      . alum & mag hydroxide-simeth (MAALOX/MYLANTA) 200-200-20 MG/5ML suspension 30 mL  30 mL Oral Q4H PRN Beau Fanny, FNP      . amLODipine (NORVASC) tablet 5 mg  5 mg Oral Daily Jomarie Longs, MD   5 mg at 05/26/16 0809  . benztropine (COGENTIN) tablet 0.5 mg  0.5 mg Oral QHS PRN Jomarie Longs, MD      . cloNIDine (CATAPRES) tablet 0.1 mg  0.1 mg Oral BID PRN Jomarie Longs, MD      . feeding supplement (ENSURE ENLIVE) (ENSURE ENLIVE) liquid 237 mL  237 mL Oral BID BM Mena Goes Simon, PA-C   237 mL at 05/26/16 1000  . gabapentin (NEURONTIN) capsule 400 mg  400 mg Oral TID Georgiann Cocker, MD   400 mg at 05/26/16 0809  . hydrOXYzine (ATARAX/VISTARIL) tablet 10 mg  10 mg Oral TID PRN Jomarie Longs, MD   10 mg at 05/26/16 0912  . hydrOXYzine (ATARAX/VISTARIL) tablet 25 mg  25 mg Oral Q6H PRN Georgiann Cocker, MD   25 mg at 05/26/16 0808  . lisinopril (PRINIVIL,ZESTRIL) tablet 20 mg  20 mg Oral Daily Jomarie Longs, MD   20 mg at 05/26/16 0809  . magnesium hydroxide (MILK OF MAGNESIA) suspension 30 mL  30 mL Oral Daily PRN Beau Fanny, FNP      . nicotine (NICODERM CQ - dosed in mg/24 hours) patch 21 mg  21 mg Transdermal Daily Kerry Hough, PA-C   21 mg at 05/26/16 0811  . OLANZapine (ZYPREXA) tablet 5 mg  5 mg Oral QHS Edgar Reisz, MD      . sertraline (ZOLOFT) tablet 100 mg  100 mg Oral Daily Georgiann Cocker, MD   100 mg at 05/26/16 0809  . tamsulosin (FLOMAX) capsule 0.4 mg  0.4 mg Oral BID Beau Fanny, FNP   0.4 mg at 05/26/16 1215  . traZODone (DESYREL) tablet 50 mg  50 mg Oral QHS,MR X 1 Jackelyn Poling, NP   50 mg at 05/25/16 2302    Lab Results:  No results found for this or any previous visit (from the past 48 hour(s)).  Blood Alcohol level:  Lab Results  Component Value Date   ETH <5 05/20/2016   ETH <5 12/10/2015    Metabolic Disorder Labs: Lab Results  Component Value Date   HGBA1C 5.7  04/08/2015   No results found for: PROLACTIN No results found for: CHOL, TRIG, HDL, CHOLHDL, VLDL, LDLCALC  Physical Findings: AIMS: Facial and Oral Movements Muscles of Facial Expression: None, normal Lips and Perioral Area: None, normal Jaw: None, normal Tongue: None, normal,Extremity Movements  Upper (arms, wrists, hands, fingers): None, normal Lower (legs, knees, ankles, toes): None, normal, Trunk Movements Neck, shoulders, hips: None, normal, Overall Severity Severity of abnormal movements (highest score from questions above): None, normal Incapacitation due to abnormal movements: None, normal Patient's awareness of abnormal movements (rate only patient's report): No Awareness, Dental Status Current problems with teeth and/or dentures?: No Does patient usually wear dentures?: No  CIWA:  CIWA-Ar Total: 1 COWS:  COWS Total Score: 1  Musculoskeletal: Strength & Muscle Tone: within normal limits Gait & Station: normal Patient leans: N/A  Psychiatric Specialty Exam: Physical Exam  Nursing note and vitals reviewed. Psychiatric:  As above     Review of Systems  Psychiatric/Behavioral: Positive for substance abuse. The patient is nervous/anxious.   All other systems reviewed and are negative.   Blood pressure (!) 155/85, pulse 76, temperature 97.7 F (36.5 C), temperature source Oral, resp. rate 18, height  (1.905 m), weight 90.7 kg (200 lb), SpO2 100 %.Body mass index is 25 kg/m.  General Appearance:casual   Eye Contact: fair  Speech:  Normal Rate  Volume:  Normal  Mood:  Anxious and Depressed  Affect:  Tearful  Thought Process:  Linear and Descriptions of Associations: Circumstantial  Orientation:  Full (Time, Place, and Person)  Thought Content: paranoia, rumination  Suicidal Thoughts:  No  Homicidal Thoughts:  No  Memory: immediate, recent , remote grossly intact  Judgement:  Impaired  Insight:  Shallow  Psychomotor Activity:  Normal  Concentration:   Concentration: Fair and Attention Span: Fair  Recall:  [employer] of Knowledge:  Fair  Language:  Fair  Akathisia:  No  Handed:    AIMS (if indicated):     Assets:  Manufacturing systems engineer Housing  ADL's:  Intact  Cognition:  WNL  Sleep:  Number of Hours: 6.25    MDD (major depressive disorder), recurrent severe, without psychosis (HCC) unstable  Will continue today 05/26/16  plan as below except where it is noted.     Treatment Plan Summary:Patient today seen as  depressed , continues to struggle with anxiety sx,SI as well as paranoia , continue to readjust medications.  Continue Zoloft 100 mg po daily for affective sx. Will continue Trazodone 50 mg po qhs for insomnia. Will increase Vistaril to 35 mg po prn for anxiety sx. Will add Zyprexa 5 mg po qhs for mood sx/paranoia. Aff Cogentin 0.5 mg po qhs prn for EPS . Will continue Lisinopril  20 mg po daily along with Norvasc 5 mg po daily for HTN. Per pt he was taken off the Norvasc , hence will monitor BP closely and taper off Norvasc if BP under control. Clonidine 0.1 mg po bid prn for SBP >/=180. Will get TSH, LIPID PANEL, HBA1C, PL. csw will continue to work on disposition , pt continues to be motivated to get substance abuse help.   Jomarie Longs, MD 05/26/2016, 2:14 PMPatient ID: Timothy Mcmillan, male   DOB: 04/17/1955, 61 y.o.   MRN: [id number] Patient ID: Raif Chachere, male   DOB: 05-Apr-1955, 61 y.o.   MRN: [id number]

## 2016-05-25 NOTE — [facility] Group Notes (Signed)
[facility] LCSW Group Therapy  05/25/2016 3:11 PM  Type of Therapy:  Group Therapy  Participation Level:  Active  Participation Quality:  Attentive  Affect:  Appropriate  Cognitive:  Alert and Oriented  Insight:  Improving  Engagement in Therapy:  Improving  Modes of Intervention:  Confrontation, Discussion, Education, Problem-solving, Socialization and Support  Summary of Progress/Problems: Today's Topic: Overcoming Obstacles. Patients identified one short term goal and potential obstacles in reaching this goal. Patients processed barriers involved in overcoming these obstacles. Patients identified steps necessary for overcoming these obstacles and explored motivation (internal and external) for facing these difficulties head on.   Vada Swift N Smart LCSW 05/25/2016, 3:11 PM

## 2016-05-25 NOTE — Progress Notes (Signed)
D: Pt passive SI-contract for safety. Pt is pleasant and cooperative. Pt been visible on unit and in dayroom. Pt keeps to himself mostly but will engage in conversation when approached.   A: Pt was offered support and encouragement. Pt was given scheduled medications. Pt was encourage to attend groups. Q 15 minute checks were done for safety.   R:Pt attends groups and interacts well with peers and staff. Pt is taking medication. Pt has no complaints at this time .Pt receptive to treatment and safety maintained on unit.

## 2016-05-25 NOTE — Progress Notes (Signed)
[facility] MD Progress Note  05/25/2016 3:08 PM Timothy Mcmillan  MRN:  [id number] Subjective:   61 yo AAM, unemployed, lives alone, divorced. Background history of SUD. Self presented to the ER on account of chest pain and headache. Was intoxicated with cocaine at presentation. Reported worsening suicidal thoughts at presentation. Had thoughts of overdosing on street drugs and then crash his car. Reports strain in his relationship as a main stressor. UDS was positive for cocaine. He has been worked up medically and is stable.  Seen today. Chart reviewed prior to interview. Says he is a bit better today. He is tolerating medication adjustments well. Says he likes the effect of Vistaril. He wants to come off Gabapentin. Says he hopes to get into inpatient addiction treatment. He is working closely with his SW on actualizing this. Thoughts of suicide still off and on. No intent to act while here.   Nursing staff reports that he is participating at groups. No behavioral issues. Still voices suicidal thoughts at times. Has underlying irritability.   SW is coordinating aftercare.    Principal Problem: Substance induced mood disorder (HCC) Diagnosis:   Patient Active Problem List   Diagnosis Date Noted  . MDD (major depressive disorder), recurrent severe, without psychosis (HCC) [F33.2] 05/21/2016  . Substance induced mood disorder (HCC) [F19.94] 10/07/2015  . Cocaine abuse [F14.10]   . Alcohol abuse [F10.10]   . Acute renal failure (HCC) [N17.9]   . Hypokalemia [E87.6] 11/14/2014  . Polysubstance abuse [F19.10] 11/14/2014  . Alcohol withdrawal delirium (HCC) [F10.231] 11/14/2014  . CAP (community acquired pneumonia) [J18.9] 11/14/2014  . Alcohol withdrawal (HCC) [F10.239] 11/14/2014  . Acute renal failure (ARF) (HCC) [N17.9] 11/14/2014  . Acute encephalopathy [G93.40]    Total Time spent with patient: 20 minutes  Past Psychiatric History: As in H&P   Past Medical History:  Past Medical  History:  Diagnosis Date  . Depression   . ETOH abuse   . Hypertension   . RSD (reflex sympathetic dystrophy)     Past Surgical History:  Procedure Laterality Date  . EYE SURGERY    . HERNIA REPAIR    . surgery to lower L leg     Family History:  Family History  Problem Relation Age of Onset  . Diabetes type II Father   . Stroke Father    Family Psychiatric  History: As in H&P Social History:  History  Alcohol Use  . Yes    Comment: intermittently     History  Drug Use  . Types: Cocaine, "Crack" cocaine    Social History   Social History  . Marital status: Legally Separated    Spouse name: N/A  . Number of children: N/A  . Years of education: N/A   Social History Main Topics  . Smoking status: Current Every Day Smoker    Packs/day: 1.00    Types: Cigarettes  . Smokeless tobacco: Never Used     Comment: declined  . Alcohol use Yes     Comment: intermittently  . Drug use: Yes    Types: Cocaine, "Crack" cocaine  . Sexual activity: Not Currently    Birth control/ protection: None   Other Topics Concern  . None   Social History Narrative  . None   Additional Social History:    Pain Medications: See home med list Prescriptions: See home med list Over the Counter: See home med list History of alcohol / drug use?: Yes Longest period of sobriety (when/how long): 3 years  Negative Consequences of Use: Personal relationships Name of Substance 1: cocaine 1 - Age of First Use: teens 1 - Amount (size/oz): varies 1 - Frequency: occasionally 1 - Duration: ongoing 1 - Last Use / Amount: unk- was +cocaine in the ED                  Sleep: Good  Appetite:  Good  Current Medications: Current Facility-Administered Medications  Medication Dose Route Frequency Provider Last Rate Last Dose  . acetaminophen (TYLENOL) tablet 650 mg  650 mg Oral Q6H PRN Beau Fanny, FNP      . alum & mag hydroxide-simeth (MAALOX/MYLANTA) 200-200-20 MG/5ML suspension 30  mL  30 mL Oral Q4H PRN Beau Fanny, FNP      . [START ON 05/26/2016] amLODipine (NORVASC) tablet 5 mg  5 mg Oral Daily [name], MD      . cloNIDine (CATAPRES) tablet 0.1 mg  0.1 mg Oral BID PRN Jomarie Longs, MD      . feeding supplement (ENSURE ENLIVE) (ENSURE ENLIVE) liquid 237 mL  237 mL Oral BID BM Mena Goes Simon, PA-C   237 mL at 05/25/16 1000  . gabapentin (NEURONTIN) capsule 400 mg  400 mg Oral TID Georgiann Cocker, MD   400 mg at 05/25/16 1214  . hydrOXYzine (ATARAX/VISTARIL) tablet 10 mg  10 mg Oral TID PRN Jomarie Longs, MD      . hydrOXYzine (ATARAX/VISTARIL) tablet 25 mg  25 mg Oral Q6H PRN Georgiann Cocker, MD   25 mg at 05/25/16 0835  . [START ON 05/26/2016] lisinopril (PRINIVIL,ZESTRIL) tablet 20 mg  20 mg Oral Daily [name], MD      . magnesium hydroxide (MILK OF MAGNESIA) suspension 30 mL  30 mL Oral Daily PRN Beau Fanny, FNP      . nicotine (NICODERM CQ - dosed in mg/24 hours) patch 21 mg  21 mg Transdermal Daily Kerry Hough, PA-C   21 mg at 05/25/16 0831  . sertraline (ZOLOFT) tablet 100 mg  100 mg Oral Daily Georgiann Cocker, MD   100 mg at 05/25/16 0831  . tamsulosin (FLOMAX) capsule 0.4 mg  0.4 mg Oral BID Beau Fanny, FNP   0.4 mg at 05/25/16 0831  . traZODone (DESYREL) tablet 50 mg  50 mg Oral QHS,MR X 1 Jackelyn Poling, NP   50 mg at 05/24/16 2100    Lab Results:  No results found for this or any previous visit (from the past 48 hour(s)).  Blood Alcohol level:  Lab Results  Component Value Date   ETH <5 05/20/2016   ETH <5 12/10/2015    Metabolic Disorder Labs: Lab Results  Component Value Date   HGBA1C 5.7 04/08/2015   No results found for: PROLACTIN No results found for: CHOL, TRIG, HDL, CHOLHDL, VLDL, LDLCALC  Physical Findings: AIMS: Facial and Oral Movements Muscles of Facial Expression: None, normal Lips and Perioral Area: None, normal Jaw: None, normal Tongue: None, normal,Extremity Movements Upper (arms, wrists,  hands, fingers): None, normal Lower (legs, knees, ankles, toes): None, normal, Trunk Movements Neck, shoulders, hips: None, normal, Overall Severity Severity of abnormal movements (highest score from questions above): None, normal Incapacitation due to abnormal movements: None, normal Patient's awareness of abnormal movements (rate only patient's report): No Awareness, Dental Status Current problems with teeth and/or dentures?: No Does patient usually wear dentures?: No  CIWA:  CIWA-Ar Total: 1 COWS:  COWS Total Score: 1  Musculoskeletal: Strength & Muscle  Tone: within normal limits Gait & Station: normal Patient leans: N/A  Psychiatric Specialty Exam: Physical Exam  Constitutional: He is oriented to person, place, and time. He appears well-developed and well-nourished.  HENT:  Head: Normocephalic and atraumatic.  Eyes: Conjunctivae and EOM are normal. Pupils are equal, round, and reactive to light.  Neck: Normal range of motion. Neck supple.  Cardiovascular: Normal rate and regular rhythm.   Respiratory: Effort normal and breath sounds normal.  GI: Soft. Bowel sounds are normal.  Neurological: He is alert and oriented to person, place, and time. He has normal reflexes.  Skin: Skin is warm and dry.  Psychiatric:  As above     ROS  Blood pressure (!) 153/101, pulse (!) 112, temperature 97.7 F (36.5 C), resp. rate 18, height  (1.905 m), weight 90.7 kg (200 lb), SpO2 100 %.Body mass index is 25 kg/m.  General Appearance:  Neatly dressed, better interaction today. Good rapport. Appropriate behavior.   Eye Contact:  Good  Speech:  Clear and Coherent  Volume:  Normal  Mood:  Feels better today  Affect:  Restricted but mood congruent  Thought Process:  Linear  Orientation:  Full (Time, Place, and Person)  Thought Content:  Less negative ruminations. No delusional theme. No preoccupation with violent thoughts. No obsession.  No hallucination in any modality.   Suicidal  Thoughts:  Off and on  Homicidal Thoughts:  No  Memory:  WNL  Judgement:  Better  Insight:  Better  Psychomotor Activity:  Normal  Concentration:  Attention Span: Good  Recall:  Good  Fund of Knowledge:  Good  Language:  Good  Akathisia:  No  Handed:    AIMS (if indicated):     Assets:  Communication Skills Housing  ADL's:  Intact  Cognition:  WNL  Sleep:  Number of Hours: 6.75     Treatment Plan Summary: Patient is marginally better. He is tolerating recent medication adjustment well. Suicidal thoughts are less. Needs further evaluation.   Psychiatric: SUD Substance Induced Mood Disorder  Medical: HTN DM  Psychosocial:  Unemployed Strained relationship  PLAN: 1. Discontinue Gabapentin 2. Continue other medications at current dose 3. Continue to monitor mood, behavior and interaction with peers. 4. Hopeful discharge early this week.   Georgiann Cocker, MD 05/25/2016, 3:08 PMPatient ID: Loralee Pacas, male   DOB: [birth year], 61 y.o.   MRN: [id number] Patient ID: Shaquile Lutze, male   DOB: [birth year], 61 y.o.   MRN: [id number] Patient ID: Kirubel Aja, male   DOB: 07-22-1955, 61 y.o.   MRN: [id number]

## 2016-05-25 NOTE — Progress Notes (Signed)
CSW confirmed that insurance is [employer]. Pt declined at [facility]; [facility], and [facility] [place] due to managed medicare not being accepted. Pt eligible for IOP options only at this time. Pt has been trying to cancel [employer] since Thursday of last week but thinks it may be another month before policy turns to Medicare only.   Trula Slade, MSW, LCSW Clinical Social Worker 05/25/2016 10:06 AM

## 2016-05-25 NOTE — Progress Notes (Signed)
DAR NOTE: Pt present with flat affect and depressed mood in the unit. Pt has been in the dayroom mostly on the phone.  Pt denies physical pain, took all his meds as scheduled. As per self inventory, pt had a good night sleep, poor appetite, low energy, and poor concentration. Pt rate depression at 7, hopeless ness at 5, and anxiety at 7. Pt's safety ensured with 15 minute and environmental checks. Pt currently endorses passive  SI with no plan but deniesHI and A/V hallucinations. Pt verbally agrees to seek staff if SI/HI or A/VH occurs and to consult with staff before acting on these thoughts. Will continue POC.

## 2016-05-25 NOTE — Progress Notes (Signed)
Adult Psychoeducational Group Note  Date:  05/25/2016 Time:  8:53 PM  Group Topic/Focus:  Wrap-Up Group:   The focus of this group is to help patients review their daily goal of treatment and discuss progress on daily workbooks.  Participation Level:  Active  Participation Quality:  Appropriate  Affect:  Appropriate  Cognitive:  Appropriate  Insight: Appropriate  Engagement in Group:  Engaged  Modes of Intervention:  Discussion  Additional Comments:  The patient expressed that he attended group.  Octavio Manns 05/25/2016, 8:53 PM

## 2016-05-25 NOTE — Plan of Care (Signed)
Problem: Safety: Goal: Periods of time without injury will increase Outcome: Progressing Pt safe on the unit at this time   

## 2016-05-25 NOTE — Progress Notes (Signed)
[facility] and [facility] referrals made. [facility] turned down--pt would not be eligible due to their hospitalization requirements. Pt states that he will have straight Medicare as of May 1st, making him eligible for [facility]. Pt adamant that he does not want to return home--"That is a bad idea for me."   Trula Slade, MSW, LCSW Clinical Social Worker 05/25/2016 11:35 AM

## 2016-05-25 NOTE — Progress Notes (Signed)
Recreation Therapy Notes  Date: 05/25/16 Time: 1000 Location: 300 Hall Dayroom  Group Topic: Coping Skills  Goal Area(s) Addresses:  Patient will be able to identify positive coping skills. Patient will be able to identify the benefits of coping skills. Patient will be able to identify benefits of using of coping skills post d/c.  Behavioral Response: Minimal  Intervention: Scissors, magazines, glue sticks, coping skills sheet and Holiday representative paper  Activity: Counsellor.  Patients were to identify coping skills that could be used for diversions, socially, cognitively, tension releasers and physically.  Patients were to cut out pictures from magazines to represent their coping skills for each category.  Education: Pharmacologist, Building control surveyor.   Education Outcome: Acknowledges understanding/In group clarification offered/Needs additional education.   Clinical Observations/Feedback: Pt was quiet.  Pt wrote comedy as a diversion; meaningful conversation as social; reading and mindfulness as cognitive; exercise and laughter as tension releasers; and push ups and running as physical.   Caroll Rancher, LRT/CTRS         Caroll Rancher A 05/25/2016 12:39 PM

## 2016-05-26 DIAGNOSIS — F1020 Alcohol dependence, uncomplicated: Secondary | ICD-10-CM

## 2016-05-26 DIAGNOSIS — F1420 Cocaine dependence, uncomplicated: Secondary | ICD-10-CM

## 2016-05-26 MED ORDER — BENZTROPINE MESYLATE 0.5 MG PO TABS
0.5000 mg | ORAL_TABLET | Freq: Every evening | ORAL | Status: DC | PRN
  Filled 2016-05-26: qty 21

## 2016-05-26 MED ORDER — OLANZAPINE 5 MG PO TABS
5.0000 mg | ORAL_TABLET | Freq: Every day | ORAL | Status: DC
  Administered 2016-05-26 – 2016-05-27 (×2): 5 mg via ORAL
  Filled 2016-05-26: qty 21
  Filled 2016-05-26 (×3): qty 1
  Filled 2016-05-26: qty 21

## 2016-05-26 NOTE — Progress Notes (Signed)
Nursing Progress Note 7p-7a  D) Patient presents anxious, flat and depressed. Patient states his day has been "up and down" and that he is "upset thinking about everything on the outside". Patient forwards little and is not more specific when writer prompts. Patient is isolative in the milieu. Patient endorses passive SI but denies HI/AVH or pain. Patient contracts for safety on the unit.   A) Emotional support given. 1:1 interaction and active listening provided. Patient medicated with PM orders as prescribed. Medications reviewed with patient. Patient verbalized understanding of medications without further questions.  Snacks and fluids provided. Opportunities for questions or concerns presented to patient. Patient encouraged to continue to work on treatment goals. Labs, vital signs and patient behavior monitored throughout shift. Patient safety maintained with q15 min safety checks.  R) Patient receptive to interaction with nurse. Patient remains safe on the unit at this time. Patient denies any adverse medication reactions at this time. Patient is resting in bed without complaints. Will continue to monitor.

## 2016-05-26 NOTE — [facility] Group Notes (Signed)
[facility] LCSW Group Therapy  05/26/2016 1:53 PM   Type of Therapy:  Group Therapy  Participation Level:  Active  Participation Quality:  Attentive  Affect:  Appropriate  Cognitive:  Appropriate  Insight:  Improving  Engagement in Therapy:  Engaged  Modes of Intervention:  Clarification, Education, Exploration and Socialization  Summary of Progress/Problems: Today's group focused on relapse prevention.  We defined the term, and then brainstormed on ways to prevent relapse. "I'm an addict.  My reslience kept telling me that I was fine, I could bounce back, I could be OK and then I could use again. I think resilience is a negative thing."  Went on to say he is committed to changing directions this time, but details on what that looked like were non-existent.  Appears to be more invested in philosophical discourse than actual problems solving and goal identification.  Daryel Gerald B 05/26/2016 , 1:53 PM

## 2016-05-26 NOTE — Progress Notes (Signed)
Pt endorses suicidal thinking without a plan. He contracts for safety. He appears flat and sad and presents as somewhat irritable. He has requested and received Vistaril twice this a.m., first 25 mg and then 10 mg. On his self inventory, he wrote he is "having feelings of paranoia intermittently with no real evidence of anything to fear." He rated his depression 8/10, hopelessness 8/10, and anxiety 9/10. He reported fair sleep, fair appetite, low energy level, and poor concentration. He has been present in the milieu but says he feels unwell.   A: Meds given as ordered, including the aforementioned PRNs. Q15 safety checks maintained. Support/encouragement offered.  R: Pt remains free from harm and continues with treatment. Will continue to monitor for needs/safety.

## 2016-05-26 NOTE — Progress Notes (Signed)
CSW met with pt and Dr. Shea Evans this morning to discuss disposition. Patient informed that he was accepted to [facility] for Wed admission. Pt chose to turn down bed. CSW explained that pt will likely be discharged home in the next few days and WILL NOT be able to stay in the hospital until May when his Medicare takes effect so that he can go to another treatment facility [place] of state. Pt states that he is remaining hopeful that [facility] will accept him (referral to [facility] made on 4/16). Patient verbalized understanding that he is turning down an available treatment bed at [facility] and will likley be discharged home in a few days if [facility] does not have bed available for him.   Maxie Better, MSW, LCSW Clinical Social Worker 05/26/2016 10:23 AM

## 2016-05-26 NOTE — Progress Notes (Signed)
Recreation Therapy Notes  Date: 05/26/16 Time: 1000 Location: 300 [address] Dayroom   Group Topic: Communication, Team Building, Problem Solving  Goal Area(s) Addresses:  Patient will effectively work with peer towards shared goal.  Patient will identify skill used to make activity successful.  Patient will identify how skills used during activity can be used to reach post d/c goals.   Behavioral Response: Engaged  Intervention: STEM Activity   Activity: [employer]. Patients were provided the following materials: 5 drinking straws, 5 rubber bands, 5 paper clips, 2 index cards, 2 drinking cups, and 2 toilet paper rolls. Using the provided materials patients were asked to build a launching mechanisms to launch a ping pong ball approximately 12 feet. Patients were divided into teams of 3-5.   Education: Pharmacist, community, Building control surveyor.   Education Outcome: Acknowledges education/In group clarification offered/Needs additional education.   Clinical Observations/Feedback: Pt arrived late and stated he just wanted to watch.  As group went on, pt became more engaged and was offering help to one of the groups.  Pt eventually became fully ingratiated into the group and helped them complete the activity.  Pt stated the group had to use focus to complete the activity.    Caroll Rancher, LRT/CTRS         Caroll Rancher A 05/26/2016 11:51 AM

## 2016-05-26 NOTE — Progress Notes (Signed)
Recreation Therapy Notes   Animal-Assisted Activity (AAA) Program Checklist/Progress Notes Patient Eligibility Criteria Checklist & Daily Group note for Rec Tx Intervention  Date: 04.17.2018 Time: 2:45pm Location: [address]    AAA/T Program Assumption of Risk Form signed by Patient/ or Parent Legal Guardian Yes  Patient is free of allergies or sever asthma Yes  Patient reports no fear of animals Yes  Patient reports no history of cruelty to animals Yes  Patient understands his/her participation is voluntary Yes  Behavioral Response: Did not attend.     Clinical Observations/Feedback: Patient discussed with MD for appropriateness in pet therapy session. Both LRT and MD agree patient is appropriate for participation. Patient offered participation in session and signed necessary consent form without issue. Despite signing consent form patient did not attend session.    Marykay Lex Syndey Jaskolski, LRT/CTRS        Terrel Manalo L 05/26/2016 3:04 PM

## 2016-05-26 NOTE — Plan of Care (Signed)
Problem: Activity: Goal: Sleeping patterns will improve Outcome: Progressing Patient is resting in bed with his eyes closed; breathing is even and unlabored.

## 2016-05-26 NOTE — Progress Notes (Addendum)
Pt twice declined Neurontin today. He said he was told it would be d/c'd days ago. Pt requested PRN Vistaril at approximately 1715 but then decided against it, saying he was going to try to deal with his anxiety without it. Urged him to approach staff with needs. Will continue to monitor.

## 2016-05-26 NOTE — Progress Notes (Signed)
[facility] Group Notes:  (Nursing/MHT/Case Management/Adjunct)  Date:  05/26/2016  Time:  9:24 PM  Type of Therapy:  Psychoeducational Skills  Participation Level:  Active  Participation Quality:  Appropriate  Affect:  Appropriate  Cognitive:  Appropriate  Insight:  Good  Engagement in Group:  Engaged  Modes of Intervention:  Education  Summary of Progress/Problems: The patient described his day as having been "up and down". He explained that his medication was effective today, but that his anxiety is an issue. His goal for tomorrow is to work on a few things even though he has dozens of assignments to complete.   Hazle Coca S 05/26/2016, 9:24 PM

## 2016-05-27 LAB — LIPID PANEL
CHOLESTEROL: 141 mg/dL (ref 0–200)
HDL: 50 mg/dL (ref >40)
LDL Cholesterol: 78 mg/dL (ref 0–99)
TRIGLYCERIDES: 65 mg/dL (ref <150)
Total CHOL/HDL Ratio: 2.8 RATIO
VLDL: 13 mg/dL (ref 0–40)

## 2016-05-27 LAB — TSH: TSH: 1.017 u[IU]/mL (ref 0.350–4.500)

## 2016-05-27 NOTE — Plan of Care (Signed)
Problem: Education: Goal: Ability to make informed decisions regarding treatment will improve Outcome: Progressing Nurse discussed depression/anxiety/coping skills with patient.    

## 2016-05-27 NOTE — [facility] Group Notes (Signed)
[facility] Group Notes:  (Counselor/Nursing/MHT/Case Management/Adjunct)  05/27/2016 1:15PM  Type of Therapy:  Group Therapy  Participation Level:  Active  Participation Quality:  Appropriate  Affect:  Flat  Cognitive:  Oriented  Insight:  Improving  Engagement in Group:  Limited  Engagement in Therapy:  Limited  Modes of Intervention:  Discussion, Exploration and Socialization  Summary of Progress/Problems: The topic for group was balance in life.  Pt participated in the discussion about when their life was in balance and out of balance and how this feels.  Pt discussed ways to get back in balance and short term goals they can work on to get where they want to be. "Expression of emotion which is appropriate and not dysfunctional."   "Today I am emotionally balanced with the help of the medications I am taking."  Also identified exercise as a a coping mechanism. Stayed the entire time, engaged throughout   Timothy Mcmillan 05/27/2016 1:23 PM

## 2016-05-27 NOTE — Progress Notes (Signed)
[facility] MD Progress Note  05/27/2016 2:37 PM Orey Moure  MRN:  [id number]  Subjective: Montrell states "I feel withdrawn today. The depression is improving, but the medicines are making me feel tired. I had an interview with the [facility], they are waiting to hear from the SW to make a decision".   Objective: Patient seen and chart reviewed. Discussed patient with treatment team. Pt today says his depression is improving, but says the medicines are making him tired. He remains on the current dose of as it has helped in the past. He is currently trying to see if it will help him this time. CSW will continue to work on placement , pt declined [facility] today , he is hopeful [facility]. He says he talked to the [facility] already, hoping the SW will have a talk with [facility] today so that they can make a decision. He is compliant on the treatment regimen, denies ADRs, however, says they medications are making tired.. Pt continues to be motivated to go to a substance abuse treatment program.  Principal Problem: MDD (major depressive disorder), recurrent severe, without psychosis (HCC) Diagnosis:   Patient Active Problem List   Diagnosis Date Noted  . Cocaine use disorder, severe, dependence (HCC) [F14.20] 05/25/2016  . Alcohol use disorder, moderate, dependence (HCC) [F10.20] 05/25/2016  . Essential hypertension [I10] 05/25/2016  . MDD (major depressive disorder), recurrent severe, without psychosis (HCC) [F33.2] 05/21/2016  . Substance induced mood disorder (HCC) [F19.94] 10/07/2015  . Cocaine abuse [F14.10]   . Alcohol abuse [F10.10]   . Acute renal failure (HCC) [N17.9]   . Hypokalemia [E87.6] 11/14/2014  . Polysubstance abuse [F19.10] 11/14/2014  . Alcohol withdrawal delirium (HCC) [F10.231] 11/14/2014  . CAP (community acquired pneumonia) [J18.9] 11/14/2014  . Alcohol withdrawal (HCC) [F10.239] 11/14/2014  . Acute renal failure (ARF) (HCC) [N17.9] 11/14/2014  . Acute  encephalopathy [G93.40]    Total Time spent with patient: 15 minutes  Past Psychiatric History: As in H&P  Past Medical History:  Past Medical History:  Diagnosis Date  . Depression   . ETOH abuse   . Hypertension   . RSD (reflex sympathetic dystrophy)     Past Surgical History:  Procedure Laterality Date  . EYE SURGERY    . HERNIA REPAIR    . surgery to lower L leg     Family History:  Family History  Problem Relation Age of Onset  . Diabetes type II Father   . Stroke Father    Family Psychiatric  History: As in H&P  Social History:  History  Alcohol Use  . Yes    Comment: intermittently     History  Drug Use  . Types: Cocaine, "Crack" cocaine    Social History   Social History  . Marital status: Legally Separated    Spouse name: N/A  . Number of children: N/A  . Years of education: N/A   Social History Main Topics  . Smoking status: Current Every Day Smoker    Packs/day: 1.00    Types: Cigarettes  . Smokeless tobacco: Never Used     Comment: declined  . Alcohol use Yes     Comment: intermittently  . Drug use: Yes    Types: Cocaine, "Crack" cocaine  . Sexual activity: Not Currently    Birth control/ protection: None   Other Topics Concern  . None   Social History Narrative  . None   Additional Social History:    Pain Medications:  See home med list Prescriptions: See home med list Over the Counter: See home med list History of alcohol / drug use?: Yes Longest period of sobriety (when/how long): 3 years Negative Consequences of Use: Personal relationships Name of Substance 1: cocaine 1 - Age of First Use: teens 1 - Amount (size/oz): varies 1 - Frequency: occasionally 1 - Duration: ongoing 1 - Last Use / Amount: unk- was +cocaine in the ED  Sleep: Good  Appetite:  Fair  Current Medications: Current Facility-Administered Medications  Medication Dose Route Frequency Provider Last Rate Last Dose  . acetaminophen (TYLENOL) tablet 650 mg   650 mg Oral Q6H PRN Beau Fanny, FNP      . alum & mag hydroxide-simeth (MAALOX/MYLANTA) 200-200-20 MG/5ML suspension 30 mL  30 mL Oral Q4H PRN Beau Fanny, FNP      . amLODipine (NORVASC) tablet 5 mg  5 mg Oral Daily Jomarie Longs, MD   5 mg at 05/27/16 0811  . benztropine (COGENTIN) tablet 0.5 mg  0.5 mg Oral QHS PRN [name], MD      . cloNIDine (CATAPRES) tablet 0.1 mg  0.1 mg Oral BID PRN Jomarie Longs, MD      . feeding supplement (ENSURE ENLIVE) (ENSURE ENLIVE) liquid 237 mL  237 mL Oral BID BM Mena Goes Simon, PA-C   237 mL at 05/27/16 1427  . gabapentin (NEURONTIN) capsule 400 mg  400 mg Oral TID Georgiann Cocker, MD   400 mg at 05/27/16 1216  . hydrOXYzine (ATARAX/VISTARIL) tablet 10 mg  10 mg Oral TID PRN Jomarie Longs, MD   10 mg at 05/26/16 2219  . hydrOXYzine (ATARAX/VISTARIL) tablet 25 mg  25 mg Oral Q6H PRN Georgiann Cocker, MD   25 mg at 05/26/16 1941  . lisinopril (PRINIVIL,ZESTRIL) tablet 20 mg  20 mg Oral Daily Jomarie Longs, MD   20 mg at 05/27/16 0810  . magnesium hydroxide (MILK OF MAGNESIA) suspension 30 mL  30 mL Oral Daily PRN Beau Fanny, FNP      . nicotine (NICODERM CQ - dosed in mg/24 hours) patch 21 mg  21 mg Transdermal Daily Kerry Hough, PA-C   21 mg at 05/27/16 0809  . OLANZapine (ZYPREXA) tablet 5 mg  5 mg Oral QHS Jomarie Longs, MD   5 mg at 05/26/16 2216  . sertraline (ZOLOFT) tablet 100 mg  100 mg Oral Daily Georgiann Cocker, MD   100 mg at 05/27/16 0809  . tamsulosin (FLOMAX) capsule 0.4 mg  0.4 mg Oral BID Beau Fanny, FNP   0.4 mg at 05/27/16 0810  . traZODone (DESYREL) tablet 50 mg  50 mg Oral QHS,MR X 1 Jackelyn Poling, NP   50 mg at 05/26/16 2216    Lab Results:  Results for orders placed or performed during the hospital encounter of 05/21/16 (from the past 48 hour(s))  TSH     Status: None   Collection Time: 05/27/16  6:12 AM  Result Value Ref Range   TSH 1.017 0.350 - 4.500 uIU/mL    Comment: Performed by a 3rd  Generation assay with a functional sensitivity of <=0.01 uIU/mL. Performed at [facility], [address], [place], [place] [address]   Lipid panel     Status: None   Collection Time: 05/27/16  6:12 AM  Result Value Ref Range   Cholesterol 141 0 - 200 mg/dL   Triglycerides 65 <811 mg/dL   HDL 50 >91 mg/dL  Total CHOL/HDL Ratio 2.8 RATIO   VLDL 13 0 - 40 mg/dL   LDL Cholesterol 78 0 - 99 mg/dL    Comment:        Total Cholesterol/HDL:CHD Risk Coronary Heart Disease Risk Table                     Men   Women  1/2 Average Risk   3.4   3.3  Average Risk       5.0   4.4  2 X Average Risk   9.6   7.1  3 X Average Risk  23.4   11.0        Use the calculated Patient Ratio above and the CHD Risk Table to determine the patient's CHD Risk.        ATP III CLASSIFICATION (LDL):  <100     mg/dL   Optimal  409-811  mg/dL   Near or Above                    Optimal  130-159  mg/dL   Borderline  914-782  mg/dL   High  >956     mg/dL   Very High Performed at [facility], [address], [place], [place] [address]     Blood Alcohol level:  Lab Results  Component Value Date   [facility] <5 05/20/2016   ETH <5 12/10/2015    Metabolic Disorder Labs: Lab Results  Component Value Date   HGBA1C 5.7 04/08/2015   No results found for: PROLACTIN Lab Results  Component Value Date   CHOL 141 05/27/2016   TRIG 65 05/27/2016   HDL 50 05/27/2016   CHOLHDL 2.8 05/27/2016   VLDL 13 05/27/2016   LDLCALC 78 05/27/2016    Physical Findings: AIMS: Facial and Oral Movements Muscles of Facial Expression: None, normal Lips and Perioral Area: None, normal Jaw: None, normal Tongue: None, normal,Extremity Movements Upper (arms, wrists, hands, fingers): None, normal Lower (legs, knees, ankles, toes): None, normal, Trunk Movements Neck, shoulders, hips: None, normal, Overall Severity Severity of abnormal movements (highest score from questions above): None,  normal Incapacitation due to abnormal movements: None, normal Patient's awareness of abnormal movements (rate only patient's report): No Awareness, Dental Status Current problems with teeth and/or dentures?: No Does patient usually wear dentures?: No  CIWA:  CIWA-Ar Total: 1 COWS:  COWS Total Score: 1  Musculoskeletal: Strength & Muscle Tone: within normal limits Gait & Station: normal Patient leans: N/A  Psychiatric Specialty Exam: Physical Exam  Nursing note and vitals reviewed. Psychiatric:  As above     Review of Systems  Psychiatric/Behavioral: Positive for substance abuse. The patient is nervous/anxious.   All other systems reviewed and are negative.   Blood pressure 139/90, pulse (!) 57, temperature 97.7 F (36.5 C), temperature source Oral, resp. rate 16, height  (1.905 m), weight 90.7 kg (200 lb), SpO2 100 %.Body mass index is 25 kg/m.  General Appearance: Casual   Eye Contact: fair  Speech:  Normal Rate  Volume:  Normal  Mood:  "I feel withdrawn today"  Affect:  Constricted  Thought Process:  Linear and Descriptions of Associations: Circumstantial  Orientation:  Full (Time, Place, and Person)  Thought Content: Paranoid ideations, rumination  Suicidal Thoughts:  Denies any thoughts, plans or intent  Homicidal Thoughts:  No  Memory: Immediate, recent , remote grossly intact  Judgement:  Fair  Insight:  Shallow  Psychomotor Activity:  Normal  Concentration:  Concentration: Fair and Attention Span: Fair  Recall:  [employer] of Knowledge:  Fair  Language:  Fair  Akathisia:  No  Handed:    AIMS (if indicated):     Assets:  Communication Skills Housing  ADL's:  Intact  Cognition:  WNL  Sleep:  Number of Hours: 5.75    MDD (major depressive disorder), recurrent severe, without psychosis (HCC) unstable  Will continue today 05/27/16  plan as below except where it is noted.  Treatment Plan Summary: Patient today seen, says he feels withdrawn, continues  to struggle with anxiety sx, SI as well as paranoia, continue to readjust medications. He denies any plans or intent to hurt himself.  Will continue Zoloft 100 mg po daily for affective sx. Will continue Trazodone 50 mg po qhs for insomnia. Will continue Vistaril to 35 mg po prn for anxiety sx. Will continue Zyprexa 5 mg po qhs for mood sx/paranoia. Will continue Cogentin 0.5 mg po qhs prn for EPS . Will continue Lisinopril  20 mg po daily along with Norvasc 5 mg po daily for HTN. Per pt he was taken off the Norvasc, hence will monitor BP closely and taper off Norvasc if BP under control. Clonidine 0.1 mg po bid prn for SBP >/=180. Reviewed current lat reports,  TSH - WNL, LIPID PANEL - WNL, HBA1C - result pending, PL - result pending. CSW will continue to work on disposition, pt continues to be motivated to get substance abuse help.  Sanjuana Kava, NP, PMHNP, FNP-BC. 05/27/2016, 2:37 PMPatient ID: Loralee Pacas, male   DOB: [birth year], 61 y.o.   MRN: [id number] Agree with NP Progress Note

## 2016-05-27 NOTE — Plan of Care (Signed)
Problem: Health Behavior/Discharge Planning: Goal: Compliance with treatment plan for underlying cause of condition will improve Outcome: Progressing Patient is attending groups and taking medications as prescribed.

## 2016-05-27 NOTE — Progress Notes (Signed)
  [facility] Adult Case Management Discharge Plan :  Will you be returning to the same living situation after discharge:  No. Pt accepted to [facility] At discharge, do you have transportation home?: Yes, [facility] drive will pick him up at 11am on Thursday, 4/19 Do you have the ability to pay for your medications: Managed medicare  Release of information consent forms completed and submitted to medical records by CSW.  Patient to Follow up at: Follow-up Information    [facility] Follow up on 05/28/2016.   Why:  You have been accepted for admission on Thursday. [facility] driver will pick you up at 11:00AM for transport to facility. Please make sure to have 21 day supply of medications provided to you by hospital. Thank you.  Contact information: [address] [place], [place] [address] Phone: [phone] Fax: [phone]          Next level of care provider has access to [facility] Link:no.  Safety Planning and Suicide Prevention discussed:Yes, SPE completed with pt's friend.   Have you used any form of tobacco in the last 30 days? (Cigarettes, Smokeless Tobacco, Cigars, and/or Pipes): Yes  Has patient been referred to the Quitline?: Patient declined referral  Patient has been referred for addiction treatment: Yes  Ledell Peoples Smart LCSW 05/27/2016, 3:43 PM

## 2016-05-27 NOTE — Progress Notes (Signed)
Nursing Progress Note 7p-7a  D) Patient presents depressed and appears anxious. Patient is seen in the milieu. Patient states "I can't fill prescriptions with my insurance, I need medications when I leave here".  Patient states "I don't want to be off my meds and come back here". Patient denies SI/HI/AVH or pain. Patient contracts for safety on the unit.   A) Emotional support given. 1:1 interaction and active listening provided. Patient medicated with PM orders as prescribed. Medications reviewed with patient. Patient verbalized understanding of medications without further questions.  Snacks and fluids provided. Opportunities for questions or concerns presented to patient. Patient encouraged to continue to work on treatment goals. Labs, vital signs and patient behavior monitored throughout shift. Patient safety maintained with q15 min safety checks.  R) Patient receptive to interaction with nurse. Patient remains safe on the unit at this time. Patient denies any adverse medication reactions at this time. Patient is resting in bed without complaints. Will continue to monitor.

## 2016-05-27 NOTE — Progress Notes (Signed)
D:  Patient's self inventory sheet, patient has poor sleep, sleep medication not helpful.  Poor appetite, low energy level, poor concentration.  Rated depression and anxiety 8, hopeless 7.  Deneid withdrawals.  SI, contracts for safety, no plan.  Physical problems, pain, worst pain in past 24 hours is #6, L leg.  Pain medicaion not helpful.  Goal is to focus, concentration, get medication right, interview apt.  Plans to be attentive in groups and counseling.  Does have discharge plans. A:  Medications administered per MD orders.  Emotional support and encouragement. R:  SI, no plan, contracts for safety.  Denied HI.   Denied A/V hallucinations.  Safety maintained with 15 minute checks.  NP informed that patient will be picked up tomorrow Thursday 05/28/2016 at 1100 by [facility] per SW.

## 2016-05-27 NOTE — Progress Notes (Signed)
The patient attended group but slept for the entire duration of the session.

## 2016-05-27 NOTE — Progress Notes (Signed)
Recreation Therapy Notes  Date: 05/27/16 Time: 1000 Location: 300 Hall Dayroom  Group Topic: Anger Management  Goal Area(s) Addresses:  Patient will identify triggers for anger.  Patient will identify physical reaction to anger.   Patient will identify benefit of using coping skills when angry.  Behavioral Response: Minimal  Intervention: Worksheets, pencils  Activity: Anger Umbrella.  Patients were given a worksheet with an umbrella on it.  Patients were to identify the situations that cause them to get angry and write them inside the umbrella.  Patients were to then identify coping skills they could use to deal with their anger and write it on the outside of the umbrella.  Education:Anger Management, Discharge Planning   Education Outcome: Acknowledges education/In group clarification offered/Needs additional education.   Clinical Observations/Feedback: Pt seemed drowsy and flat.  Pt expressed when angry, he isolates himself.  Pt stated he gets angry when there is conflict, repeated mistakes and things loss.  Pt identified some of his coping skills as mindfulness, changing focus and exercise.  Pt stated if he uses his coping skills it will "dissipate anger, give me better focus and address situations more effectively".   Caroll Rancher, LRT/CTRS         Caroll Rancher A 05/27/2016 12:26 PM

## 2016-05-27 NOTE — Tx Team (Signed)
Interdisciplinary Treatment and Diagnostic Plan Update  05/27/2016 Time of Session: [name] MRN: [id number]  Principal Diagnosis: Cocaine Use Disorder, Severe  Secondary Diagnoses: Principal Problem:   MDD (major depressive disorder), recurrent severe, without psychosis ([name]) Active Problems:   Substance induced mood disorder (HCC)   Cocaine use disorder, severe, dependence ([name])   Alcohol use disorder, moderate, dependence ([name])   Essential hypertension   Current Medications:  Current Facility-Administered Medications  Medication Dose Route Frequency Provider Last Rate Last Dose  . acetaminophen (TYLENOL) tablet 650 mg  650 mg Oral Q6H PRN [name], FNP      . alum & mag hydroxide-simeth (MAALOX/MYLANTA) 200-200-20 MG/5ML suspension 30 mL  30 mL Oral Q4H PRN [name], FNP      . amLODipine (NORVASC) tablet 5 mg  5 mg Oral Daily [name], MD   5 mg at 05/27/16 0811  . benztropine (COGENTIN) tablet 0.5 mg  0.5 mg Oral QHS PRN [name], MD      . cloNIDine (CATAPRES) tablet 0.1 mg  0.1 mg Oral BID PRN [name], MD      . feeding supplement (ENSURE ENLIVE) (ENSURE ENLIVE) liquid 237 mL  237 mL Oral BID [name], PA-C   237 mL at 05/27/16 1427  . gabapentin (NEURONTIN) capsule 400 mg  400 mg Oral TID [name], MD   400 mg at 05/27/16 1216  . hydrOXYzine (ATARAX/VISTARIL) tablet 10 mg  10 mg Oral TID PRN [name], MD   10 mg at 05/26/16 2219  . hydrOXYzine (ATARAX/VISTARIL) tablet 25 mg  25 mg Oral Q6H PRN [name], MD   25 mg at 05/26/16 1941  . lisinopril (PRINIVIL,ZESTRIL) tablet 20 mg  20 mg Oral Daily [name], MD   20 mg at 05/27/16 0810  . magnesium hydroxide (MILK OF MAGNESIA) suspension 30 mL  30 mL Oral Daily PRN [name], FNP      . nicotine (NICODERM CQ - dosed in mg/24 hours) patch 21 mg  21 mg Transdermal Daily Laverle Hobby, PA-C   21 mg at 05/27/16 0809  . OLANZapine (ZYPREXA)  tablet 5 mg  5 mg Oral QHS [name], MD   5 mg at 05/26/16 2216  . sertraline (ZOLOFT) tablet 100 mg  100 mg Oral Daily [name], MD   100 mg at 05/27/16 0809  . tamsulosin (FLOMAX) capsule 0.4 mg  0.4 mg Oral BID [name], FNP   0.4 mg at 05/27/16 0810  . traZODone (DESYREL) tablet 50 mg  50 mg Oral QHS,MR X 1 [name], NP   50 mg at 05/26/16 2216   PTA Medications: Prescriptions Prior to Admission  Medication Sig Dispense Refill Last Dose  . albuterol (PROVENTIL HFA;VENTOLIN HFA) 108 (90 Base) MCG/ACT inhaler Inhale 1-2 puffs into the lungs every 4 (four) hours as needed for wheezing or shortness of breath.   More than a month at Unknown time  . amLODipine (NORVASC) 10 MG tablet Take 10 mg by mouth daily.   More than a month at Unknown time  . lisinopril (PRINIVIL,ZESTRIL) 20 MG tablet Take 1 tablet (20 mg total) by mouth daily. 30 tablet 0 More than a month at Unknown time  . tamsulosin (FLOMAX) 0.4 MG CAPS capsule Take 1 capsule (0.4 mg total) by mouth daily. (Patient taking differently: Take 0.4 mg by mouth 2 (two) times daily. ) 30 capsule 0 More than a month  at Unknown time    Patient Stressors: Health problems Medication change or noncompliance Substance abuse Other: Conflict with girlfriend  Patient Strengths: Ability for insight Average or above average intelligence Capable of independent living General fund of knowledge Motivation for treatment/growth  Treatment Modalities: Medication Management, Group therapy, Case management,  1 to 1 session with clinician, Psychoeducation, Recreational therapy.   Physician Treatment Plan for Primary Diagnosis: Cocaine Use Disorder, Severe  Medication Management: Evaluate patient's response, side effects, and tolerance of medication regimen.  Therapeutic Interventions: 1 to 1 sessions, Unit Group sessions and Medication administration.  Evaluation of Outcomes: Met  Physician Treatment Plan for Secondary  Diagnosis: Principal Problem:   MDD (major depressive disorder), recurrent severe, without psychosis ([name]) Active Problems:   Substance induced mood disorder (HCC)   Cocaine use disorder, severe, dependence ([name])   Alcohol use disorder, moderate, dependence ([name])   Essential hypertension  Long Term Goal(s): Improvement in symptoms so as ready for discharge Improvement in symptoms so as ready for discharge   Short Term Goals: Ability to identify changes in lifestyle to reduce recurrence of condition will improve Ability to verbalize feelings will improve Ability to disclose and discuss suicidal ideas Ability to identify and develop effective coping behaviors will improve Compliance with prescribed medications will improve Ability to identify triggers associated with substance abuse/mental health issues will improve     Medication Management: Evaluate patient's response, side effects, and tolerance of medication regimen.  Therapeutic Interventions: 1 to 1 sessions, Unit Group sessions and Medication administration.  Evaluation of Outcomes: Met     RN Treatment Plan for Primary Diagnosis:Cocaine Use Disorder, Severe Long Term Goal(s): Knowledge of disease and therapeutic regimen to maintain health will improve  Short Term Goals: Ability to remain free from injury will improve, Ability to verbalize feelings will improve and Ability to disclose and discuss suicidal ideas  Medication Management: RN will administer medications as ordered by provider, will assess and evaluate patient's response and provide education to patient for prescribed medication. RN will report any adverse and/or side effects to prescribing provider.  Therapeutic Interventions: 1 on 1 counseling sessions, Psychoeducation, Medication administration, Evaluate responses to treatment, Monitor vital signs and CBGs as ordered, Perform/monitor CIWA, COWS, AIMS and Fall Risk screenings as ordered, Perform wound care treatments  as ordered.  Evaluation of Outcomes: Met   LCSW Treatment Plan for Primary Diagnosis:Cocaine Use Disorder, Severe Long Term Goal(s): Safe transition to appropriate next level of care at discharge, Engage patient in therapeutic group addressing interpersonal concerns.  Short Term Goals: Engage patient in aftercare planning with referrals and resources, Facilitate patient progression through stages of change regarding substance use diagnoses and concerns and Identify triggers associated with mental health/substance abuse issues  Therapeutic Interventions: Assess for all discharge needs, 1 to 1 time with Social worker, Explore available resources and support systems, Assess for adequacy in community support network, Educate family and significant other(s) on suicide prevention, Complete Psychosocial Assessment, Interpersonal group therapy.  Evaluation of Outcomes: Met   Progress in Treatment: Attending groups: Yes Participating in groups: Yes Taking medication as prescribed: Yes. Toleration medication: Yes. Family/Significant other contact made: SPE completed with pt's friend.  Patient understands diagnosis: Yes. Discussing patient identified problems/goals with staff: Yes. Medical problems stabilized or resolved: Yes. Denies suicidal/homicidal ideation: No. Passive SI/Able to contract for safety on the unit.  Issues/concerns per patient self-inventory: No. Other: n/a   New problem(s) identified: No, Describe:  n/a  New Short Term/Long Term Goal(s): detox; medication stabilization;  elimination of SI thoughts; development of comprehensive mental wellness/sobriety plan.   Discharge Plan or Barriers: Pt referred to [facility]--accepted but pt declined bed. Pt requested [facility] referral and did phone interview on Wed 4/18--ACCEPTED FOR ADMISSION FOR THURS 4/19. DRIVER WILL PICK HIM UP AT 11AM.. Pt declined at [place] [facility] [place]. [facility] per his request.    Reason for Continuation of Hospitalization: none  Estimated Length of Stay: Discharge   Attendees: Patient: 05/27/2016 2:47 PM  Physician: Dr. Sanjuana Letters MD 05/27/2016 2:47 PM  Nursing: Everlean Cherry RN 05/27/2016 2:47 PM  RN Care Manager: Lars Pinks CM 05/27/2016 2:47 PM  Social Worker: Maxie Better, LCSW 05/27/2016 2:47 PM  Recreational Therapist: Rhunette Croft 05/27/2016 2:47 PM  Other: Lindell Spar NP; Samuel Jester NP 05/27/2016 2:47 PM  Other:  05/27/2016 2:47 PM  Other: 05/27/2016 2:47 PM    Scribe for Treatment Team: [place], LCSW 05/27/2016 2:47 PM

## 2016-05-28 LAB — PROLACTIN: Prolactin: 30.3 ng/mL — ABNORMAL HIGH (ref 4.0–15.2)

## 2016-05-28 LAB — HEMOGLOBIN A1C
Hgb A1c MFr Bld: 5.6 % (ref 4.8–5.6)
Mean Plasma Glucose: 114 mg/dL

## 2016-05-28 MED ORDER — TAMSULOSIN HCL 0.4 MG PO CAPS: 0.4000 mg | ORAL_CAPSULE | Freq: Two times a day (BID) | ORAL | 0 refills | Status: DC

## 2016-05-28 MED ORDER — BENZTROPINE MESYLATE 0.5 MG PO TABS: 0.5000 mg | ORAL_TABLET | Freq: Every evening | ORAL | 0 refills | Status: DC | PRN

## 2016-05-28 MED ORDER — TRAZODONE HCL 50 MG PO TABS: ORAL_TABLET | ORAL | 0 refills | Status: DC

## 2016-05-28 MED ORDER — SERTRALINE HCL 100 MG PO TABS: 100.0000 mg | ORAL_TABLET | Freq: Every day | ORAL | 0 refills | Status: DC

## 2016-05-28 MED ORDER — HYDROXYZINE HCL 50 MG PO TABS: ORAL_TABLET | ORAL | 0 refills | Status: DC

## 2016-05-28 MED ORDER — HYDROXYZINE HCL 50 MG PO TABS
50.0000 mg | ORAL_TABLET | Freq: Four times a day (QID) | ORAL | Status: DC | PRN
  Filled 2016-05-28: qty 40

## 2016-05-28 MED ORDER — GABAPENTIN 400 MG PO CAPS: 400.0000 mg | ORAL_CAPSULE | Freq: Three times a day (TID) | ORAL | 0 refills | Status: DC

## 2016-05-28 MED ORDER — AMLODIPINE BESYLATE 5 MG PO TABS: 5.0000 mg | ORAL_TABLET | Freq: Every day | ORAL | 0 refills | Status: DC

## 2016-05-28 MED ORDER — LISINOPRIL 20 MG PO TABS: 20.0000 mg | ORAL_TABLET | Freq: Every day | ORAL | 0 refills | Status: DC

## 2016-05-28 MED ORDER — OLANZAPINE 5 MG PO TABS: 5.0000 mg | ORAL_TABLET | Freq: Every day | ORAL | 0 refills | Status: DC

## 2016-05-28 MED ORDER — NICOTINE 21 MG/24HR TD PT24: 21.0000 mg | MEDICATED_PATCH | Freq: Every day | TRANSDERMAL | 0 refills | Status: DC

## 2016-05-28 NOTE — Progress Notes (Signed)
Recreation Therapy Notes  Date: 05/28/16 Time: 1000 Location: 300 [address] Dayroom  Group Topic: Wellness  Goal Area(s) Addresses:  Patient will define components of whole wellness. Patient will verbalize benefit of whole wellness.  Behavioral Response: None  Intervention:  Chairs, Giant beach ball  Activity: Keep It Going Volleyball.  Patients were placed in a circle.  Patients were to pass the beach ball back and forth to each other.  The ball could bounce off the ground but could not come to a complete stop.  LRT would keep count of how many times the ball was hit.  If the ball comes to a stop, the count starts over.  Education: Wellness, Building control surveyor.   Education Outcome: Acknowledges education/In group clarification offered/Needs additional education.   Clinical Observations/Feedback: Pt sat near the door and was sleep during group.   Caroll Rancher, LRT/CTRS         Caroll Rancher A 05/28/2016 11:54 AM

## 2016-05-28 NOTE — [facility] Suicide Risk Assessment (Signed)
[facility] Discharge Suicide Risk Assessment   Principal Problem: MDD (major depressive disorder), recurrent severe, without psychosis (HCC) Discharge Diagnoses:  Patient Active Problem List   Diagnosis Date Noted  . Cocaine use disorder, severe, dependence (HCC) [F14.20] 05/25/2016  . Alcohol use disorder, moderate, dependence (HCC) [F10.20] 05/25/2016  . Essential hypertension [I10] 05/25/2016  . MDD (major depressive disorder), recurrent severe, without psychosis (HCC) [F33.2] 05/21/2016  . Substance induced mood disorder (HCC) [F19.94] 10/07/2015  . Cocaine abuse [F14.10]   . Alcohol abuse [F10.10]   . Acute renal failure (HCC) [N17.9]   . Hypokalemia [E87.6] 11/14/2014  . Polysubstance abuse [F19.10] 11/14/2014  . Alcohol withdrawal delirium (HCC) [F10.231] 11/14/2014  . CAP (community acquired pneumonia) [J18.9] 11/14/2014  . Alcohol withdrawal (HCC) [F10.239] 11/14/2014  . Acute renal failure (ARF) (HCC) [N17.9] 11/14/2014  . Acute encephalopathy [G93.40]     Total Time spent with patient: 30 minutes  Musculoskeletal: Strength & Muscle Tone: within normal limits Gait & Station: normal Patient leans: N/A  Psychiatric Specialty Exam: ROS no headache, no chest pain, no shortness of breath, no vomiting   Blood pressure (!) 146/77, pulse (!) 57, temperature 97.7 F (36.5 C), temperature source Oral, resp. rate 16, height  (1.905 m), weight 90.7 kg (200 lb), SpO2 100 %.Body mass index is 25 kg/m.  General Appearance: Well Groomed  Eye Contact::  Good  Speech:  Normal Rate409  Volume:  Normal  Mood:  reports improving mood, but still feeling depressed  Affect:  less constricted   Thought Process:  Linear and Descriptions of Associations: Intact  Orientation:  Other:  fully alert and attentive   Thought Content:  no hallucinations, no delusions, not internally preoccupied   Suicidal Thoughts:  No denies any suicidal plan or intention, denies any homicidal or violent  ideations  Homicidal Thoughts:  No  Memory:  recent and remote grossly intact   Judgement:  Other:  improving   Insight:  improving   Psychomotor Activity:  Normal  Concentration:  Good  Recall:  Good  Fund of Knowledge:Good  Language: Good  Akathisia:  Negative  Handed:  Right  AIMS (if indicated):     Assets:  Communication Skills Desire for Improvement Resilience  Sleep:  Number of Hours: 6.5  Cognition: WNL  ADL's:  Intact   Mental Status Per Nursing Assessment::   On Admission:  Suicidal ideation indicated by patient  Demographic Factors:  61 year old , single male, has two adult children, was living alone, on disability   Loss Factors: Limited social support system, disability, relapse   Historical Factors: History or prior psychiatric admissions, history of prior suicide attempts, history of cocaine abuse   Risk Reduction Factors:   Sense of responsibility to family and Positive coping skills or problem solving skills  Continued Clinical Symptoms:  At this time patient is improving compared to admission - presents alert , attentive, well related, calm , pleasant, acknowledges improving mood, feels less depressed, affect remains constricted, but smiles at times appropriately and is reactive, no thought disorder, no suicidal or self injurious ideations, no homicidal ideations, no psychotic symptoms.  Denies medication side effects. Behavior on unit in good control.  Cognitive Features That Contribute To Risk:  No gross cognitive deficits noted upon discharge. Is alert , attentive, and oriented x 3   Suicide Risk:  Mild:  Suicidal ideation of limited frequency, intensity, duration, and specificity.  There are no identifiable plans, no associated intent, mild dysphoria and related symptoms,  good self-control (both objective and subjective assessment), few other risk factors, and identifiable protective factors, including available and accessible social  support.  Follow-up Information    [facility] Follow up on 05/28/2016.   Why:  You have been accepted for admission on Thursday. [facility] driver will pick you up at 11:00AM for transport to facility. Please make sure to have 21 day supply of medications provided to you by hospital. Thank you.  Contact information: [address] [place], [place] [address] Phone: [phone] Fax: [phone]          Plan Of Care/Follow-up recommendations:  Activity:  as tolerated  Diet:  Heart Healthy Tests:  NA Other:  See below Patient is leaving unit in good spirits. Plans to go go to [facility], as above  Craige Cotta, MD 05/28/2016, 9:22 AM

## 2016-05-28 NOTE — Progress Notes (Signed)
  [facility] Adult Case Management Discharge Plan :  Will you be returning to the same living situation after discharge:  No. At discharge, do you have transportation home?: Yes,  [facility] will pick patient up at d/c.  Do you have the ability to pay for your medications: Yes,  insurance   Release of information consent forms completed and in the chart;  Patient's signature needed at discharge.  Patient to Follow up at: Follow-up Information    [facility] Follow up on 05/28/2016.   Why:  You have been accepted for admission on Thursday. [facility] driver will pick you up at 11:00AM for transport to facility. Please make sure to have 21 day supply of medications provided to you by hospital. Thank you.  Contact information: [address] [place], [place] [address] Phone: [phone] Fax: [phone]          Next level of care provider has access to [facility] Link:yes  Safety Planning and Suicide Prevention discussed: Yes,  with nurse   Have you used any form of tobacco in the last 30 days? (Cigarettes, Smokeless Tobacco, Cigars, and/or Pipes): Yes  Has patient been referred to the Quitline?: Patient refused referral  Patient has been referred for addiction treatment: N/A  Baldo Daub 05/28/2016, 11:45 AM

## 2016-05-28 NOTE — Progress Notes (Signed)
  [facility] Adult Case Management Discharge Plan :  Will you be returning to the same living situation after discharge:  No. At discharge, do you have transportation home?: Yes,  [facility] will pick patient up at d/c.  Do you have the ability to pay for your medications: Yes,  insurance   Release of information consent forms completed and in the chart;  Patient's signature needed at discharge.  Patient to Follow up at: Follow-up Information    [facility] Follow up on 05/28/2016.   Why:  You have been accepted for admission on Thursday. [facility] driver will pick you up at 11:00AM for transport to facility. Please make sure to have 21 day supply of medications provided to you by hospital. Thank you.  Contact information: [address] [place], [place] [address] Phone: [phone] Fax: [phone]          Next level of care provider has access to [facility] Link:yes  Safety Planning and Suicide Prevention discussed: Yes,  with nurse   Have you used any form of tobacco in the last 30 days? (Cigarettes, Smokeless Tobacco, Cigars, and/or Pipes): Yes  Has patient been referred to the Quitline?: Patient refused referral  Patient has been referred for addiction treatment: Yes  Baldo Daub 05/28/2016, 12:20 PM

## 2016-05-28 NOTE — Progress Notes (Signed)
Pt discharged to lobby. Pt was stable and appreciative at that time. All papers, samples and prescriptions were given and valuables returned. Verbal understanding expressed. Denies SI/HI and A/VH. Pt given opportunity to express concerns and ask questions.  
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
## 2016-07-01 ENCOUNTER — Ambulatory Visit (INDEPENDENT_AMBULATORY_CARE_PROVIDER_SITE_OTHER): Payer: Medicare Other

## 2016-07-01 ENCOUNTER — Ambulatory Visit (INDEPENDENT_AMBULATORY_CARE_PROVIDER_SITE_OTHER): Payer: Medicaid Other | Admitting: Podiatry

## 2016-07-01 ENCOUNTER — Encounter: Payer: Self-pay | Admitting: Podiatry

## 2016-07-01 DIAGNOSIS — M2041 Other hammer toe(s) (acquired), right foot: Secondary | ICD-10-CM | POA: Diagnosis not present

## 2016-07-01 DIAGNOSIS — L84 Corns and callosities: Secondary | ICD-10-CM

## 2016-07-01 DIAGNOSIS — M2042 Other hammer toe(s) (acquired), left foot: Secondary | ICD-10-CM | POA: Diagnosis not present

## 2016-07-01 DIAGNOSIS — M79672 Pain in left foot: Secondary | ICD-10-CM

## 2016-07-01 DIAGNOSIS — M722 Plantar fascial fibromatosis: Secondary | ICD-10-CM | POA: Diagnosis not present

## 2016-07-01 MED ORDER — TRIAMCINOLONE ACETONIDE 10 MG/ML IJ SUSP
10.0000 mg | Freq: Once | INTRAMUSCULAR | Status: AC
  Administered 2016-07-01: 10 mg

## 2016-07-01 NOTE — Progress Notes (Signed)
   Subjective:    Patient ID: Timothy PacasChristopher Mcmillan, male    DOB: 12/02/1955, 61 y.o.   MRN: [id number]  HPI  Chief Complaint  Patient presents with  . Foot Pain    Plantar forefoot and heel bilateral (L>R) - aching for several years, gotten worse recently, hammer toes with multiple callused area, shoes irritate    Review of Systems  Constitutional: Positive for activity change.  Eyes: Positive for redness and itching.  Cardiovascular: Positive for leg swelling.  Genitourinary: Positive for frequency and urgency.  Musculoskeletal: Positive for arthralgias, gait problem and myalgias.  Neurological: Positive for numbness.  All other systems reviewed and are negative.      Objective:   Physical Exam        Assessment & Plan:

## 2016-07-04 NOTE — Progress Notes (Signed)
Subjective:    Patient ID: Timothy Mcmillan, Timothy Mcmillan   DOB: 61 y.o.   MRN: [id number]   HPI patient presents with multiple problems with digital deformities of both feet extreme discomfort in the plantar left heel and history of pain that has gotten worse recently    Review of Systems  All other systems reviewed and are negative.       Objective:  Physical Exam  Constitutional: He is oriented to person, place, and time.  Cardiovascular: Intact distal pulses.   Musculoskeletal: Normal range of motion.  Neurological: He is alert and oriented to person, place, and time.  Skin: Skin is warm.  Nursing note and vitals reviewed.  neurovascular status was found to be intact with muscle strength adequate range of motion within normal limits. Patient's found to have quite a bit of discomfort in the plantar heel region bilateral with inflammation fluid buildup and is noted to have quite a bit of pain in the plantar aspects of the metatarsals with thick lesion formation that's been painful. Patient states he's had surgery in the past but it did not seem to make a big difference for him     Assessment:    Significant foot structural issues with what appears to be chronic plantar fasciitis bilateral and also noted to have severe lesion formation bilateral     Plan:    H&P x-rays reviewed and deep debridement of lesion accomplished and then I went ahead and injected the plantar fascia bilateral 3 mg Kenalog 5 mg Xylocaine and gave instructions on physical therapy and usage of fascial brace is. Patient will be seen back in 2 weeks and would make a good orthotic candidate to be made very soft in order to try to cushion the areas that are so difficult for him

## 2016-07-23 ENCOUNTER — Encounter: Payer: Self-pay | Admitting: Podiatry

## 2016-07-23 ENCOUNTER — Ambulatory Visit (INDEPENDENT_AMBULATORY_CARE_PROVIDER_SITE_OTHER): Payer: Medicare Other | Admitting: Podiatry

## 2016-07-23 DIAGNOSIS — L84 Corns and callosities: Secondary | ICD-10-CM | POA: Diagnosis not present

## 2016-07-23 DIAGNOSIS — M722 Plantar fascial fibromatosis: Secondary | ICD-10-CM

## 2016-07-23 DIAGNOSIS — M2042 Other hammer toe(s) (acquired), left foot: Secondary | ICD-10-CM

## 2016-07-23 DIAGNOSIS — M2041 Other hammer toe(s) (acquired), right foot: Secondary | ICD-10-CM | POA: Diagnosis not present

## 2016-07-24 NOTE — Progress Notes (Signed)
Subjective:    Patient ID: Timothy Mcmillan, male   DOB: 61 y.o.   MRN: [id number]   HPI patient stated he seemed to have some improvement with the bracing and injections and states that he would like to get inserts    ROS      Objective:  Physical Exam neurovascular status intact with patient found have discomfort plantar heels bilateral that still present but improved from previous     Assessment:    Improved fasciitis with also corns callus formation left first fifth metatarsal     Plan:    Condition explained and recommended anti-inflammatories physical therapy and am going to have orthotics made and wrote him a prescription for them. Patient be seen back as needed

## 2017-09-13 IMAGING — DX DG CHEST 2V
2 series · 2 of 2 positions shown · non-contrast
Comparison: None.

CLINICAL DATA: Shortness of breath. Cough. History of hypertension
and smoking.

EXAM:
CHEST  2 VIEW

[chest lat]
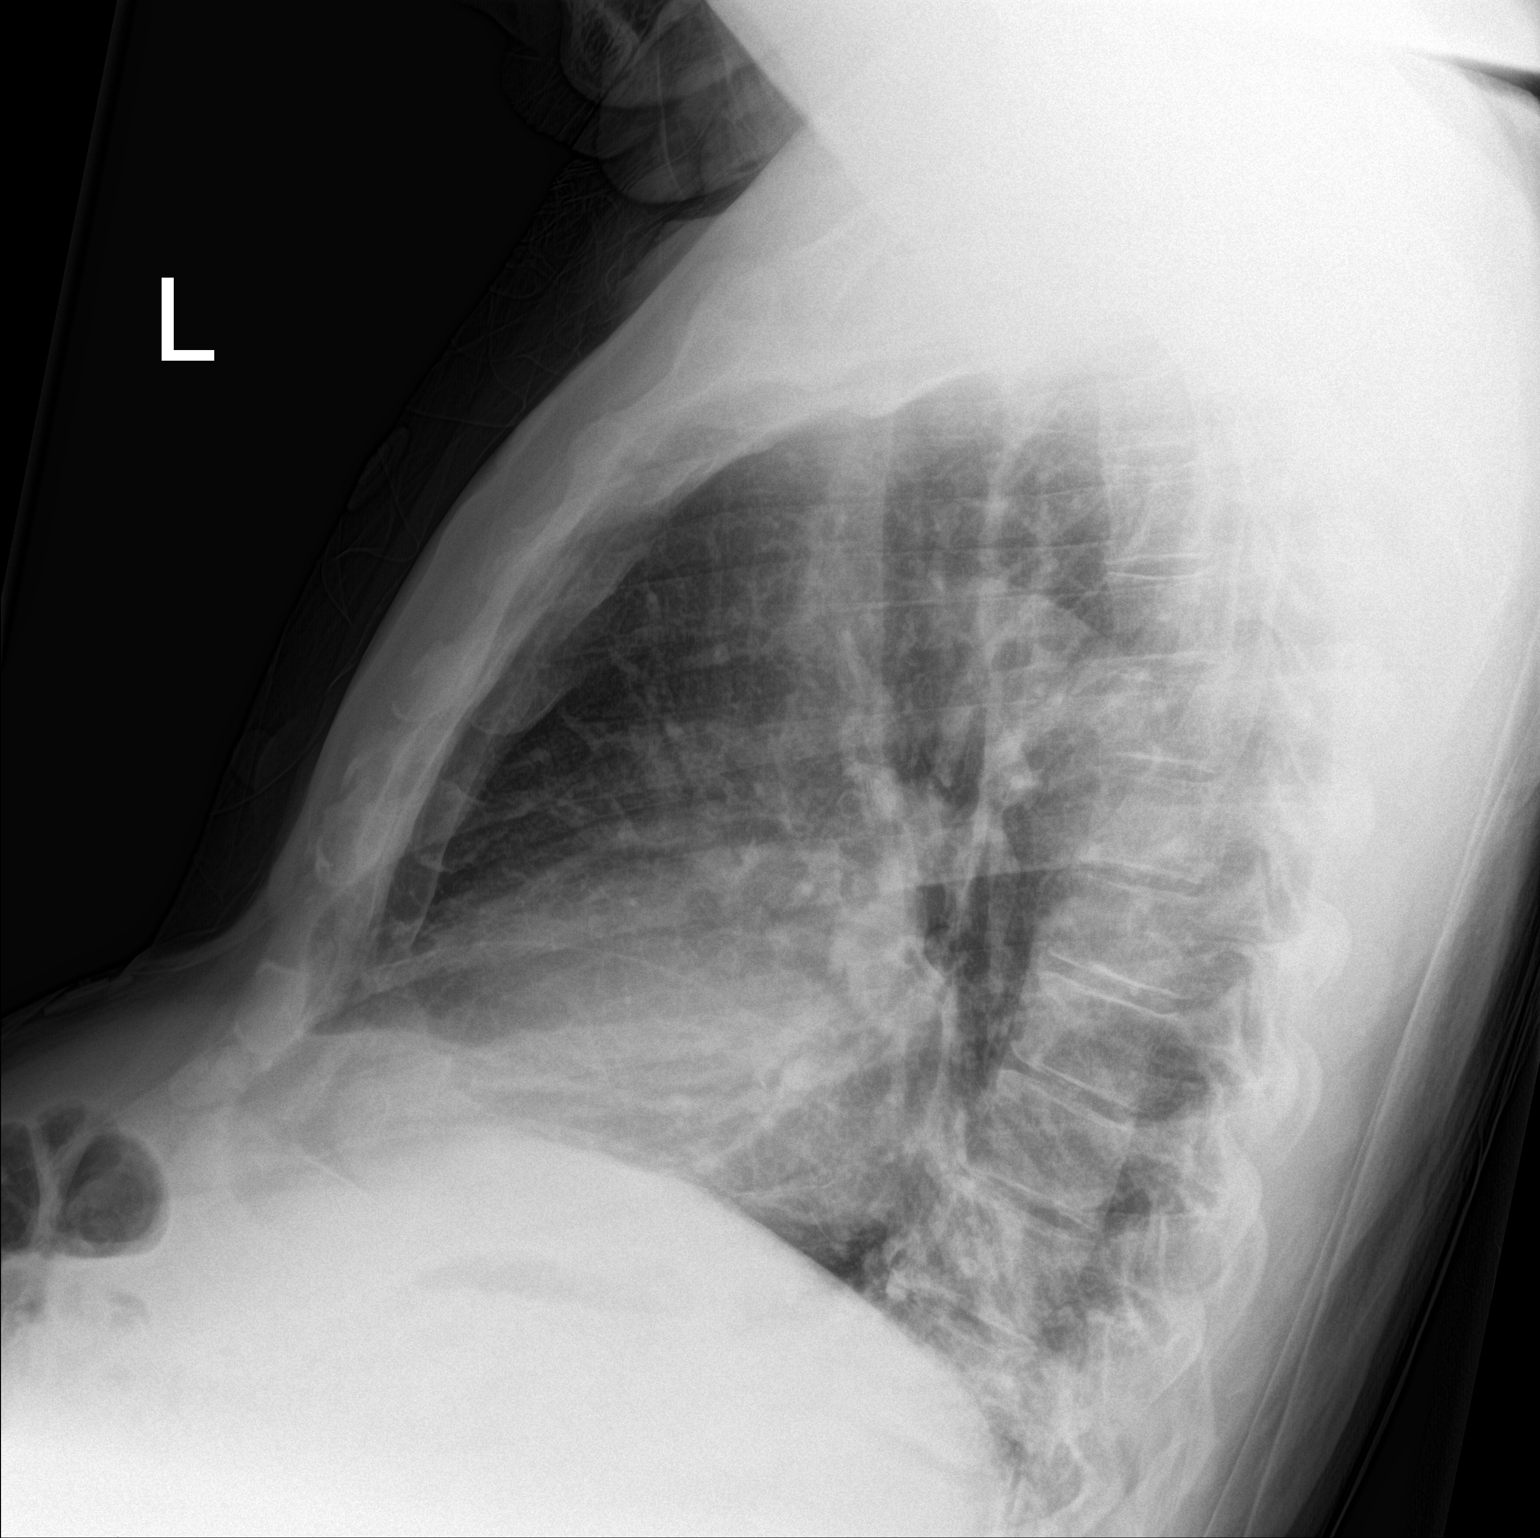

[chest ap]
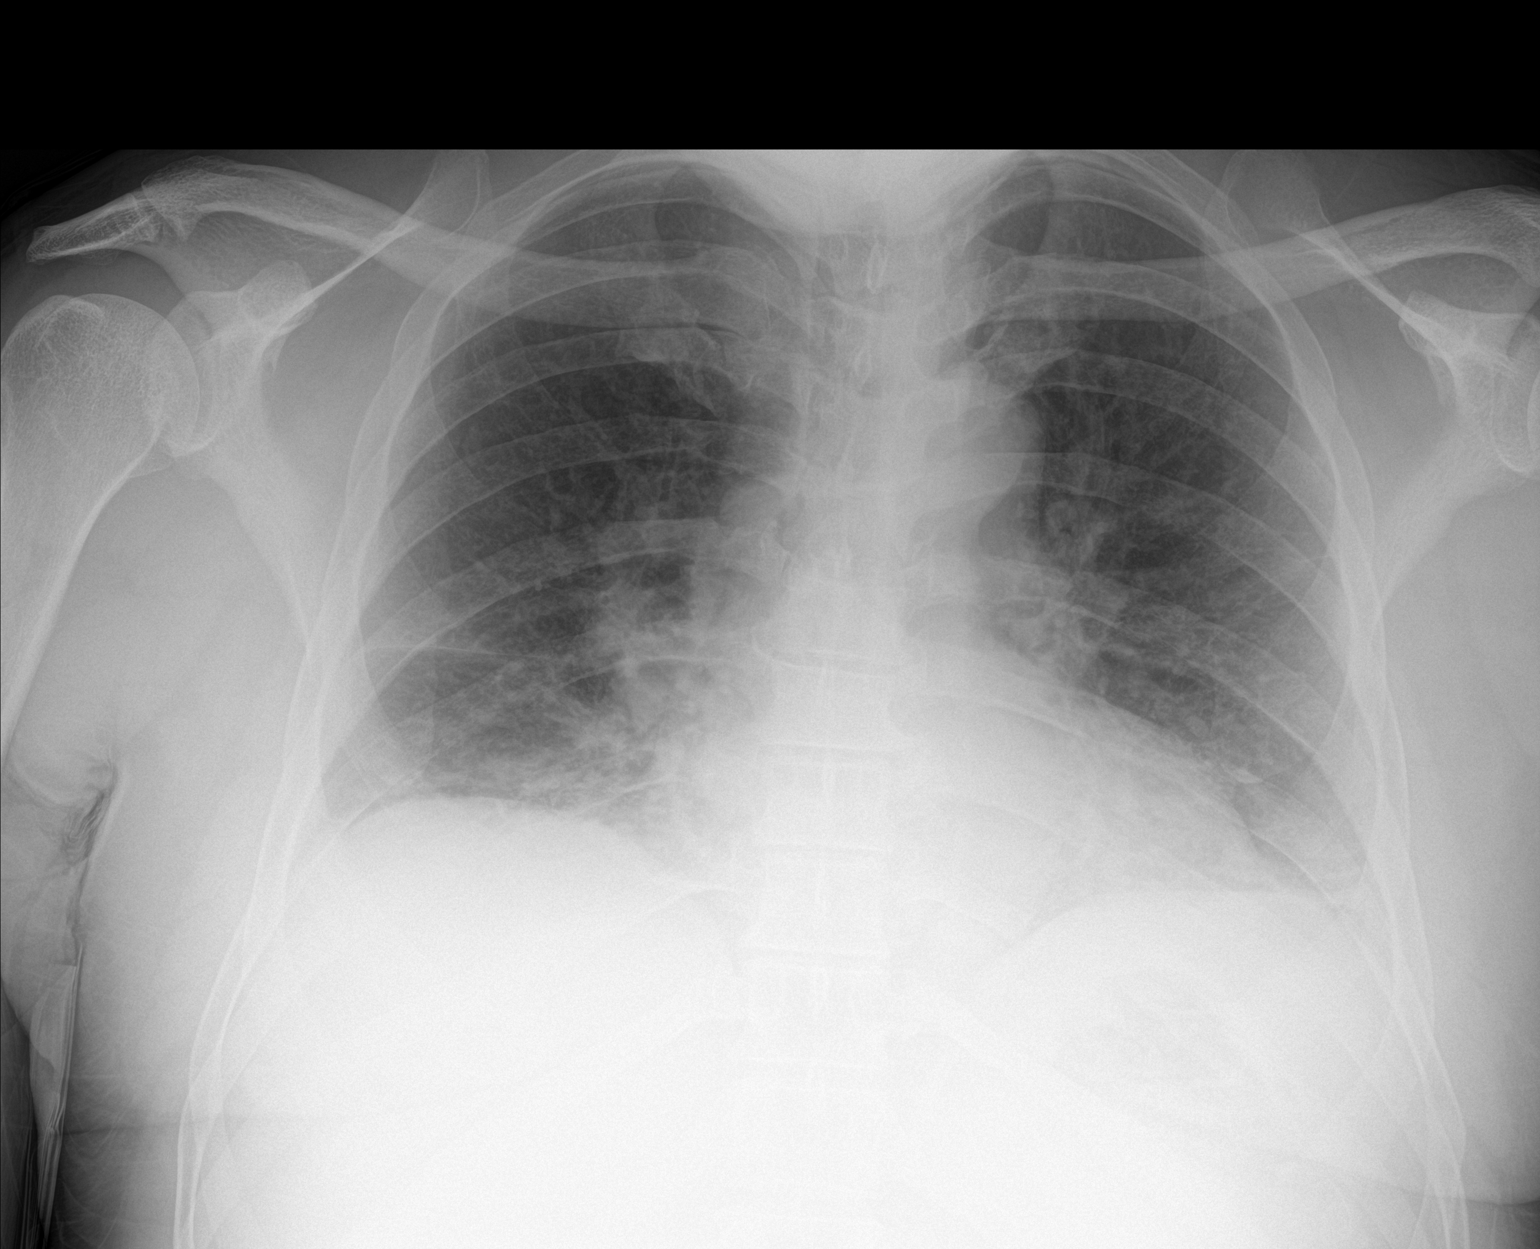

[2 of 2 positions shown; findings below may reference images not displayed]

FINDINGS: Borderline enlarged cardiac silhouette and mediastinal contours,
potentially accentuated due decreased lung volumes and kyphotic
projection. Bibasilar heterogeneous/consolidative opacities, left
greater than right. There is minimal pleural parenchymal thickening
about the peripheral aspect the right minor fissure. No definite
pleural effusion or pneumothorax. No evidence of edema. No acute
osseus abnormalities.
IMPRESSION: Bibasilar heterogeneous/consolidative opacities, left greater than
right, potentially atelectasis given mild hypoventilation though
multifocal infection could have a similar appearance. A follow-up
chest radiograph in 3 to 4 weeks after treatment is recommended to
ensure resolution.

## 2017-09-13 IMAGING — CT CT HEAD W/O CM
1 of 2 series · 16 of 30 positions shown, 20 images · non-contrast
Comparison: None.

CLINICAL DATA: Confusion

EXAM:
CT HEAD WITHOUT CONTRAST
TECHNIQUE: Contiguous axial images were obtained from the base of the skull
through the vertex without intravenous contrast.

[Series 2: head 5.0 h30s · axial · 0.47mm/px · z∈[+1216,+1361]mm · 16 of 33 slices shown, 20 images]
[im 2/33  brain]
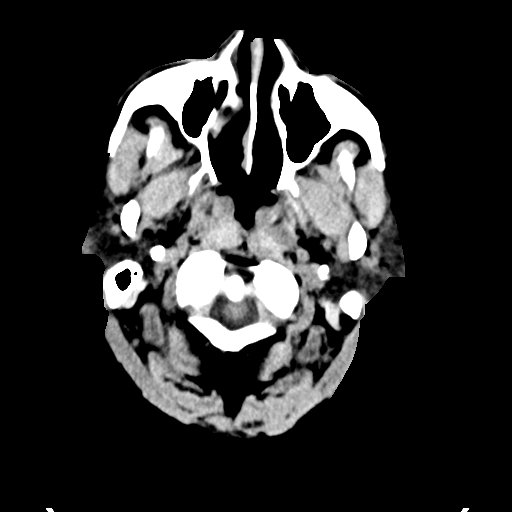
[im 2/33  bone]
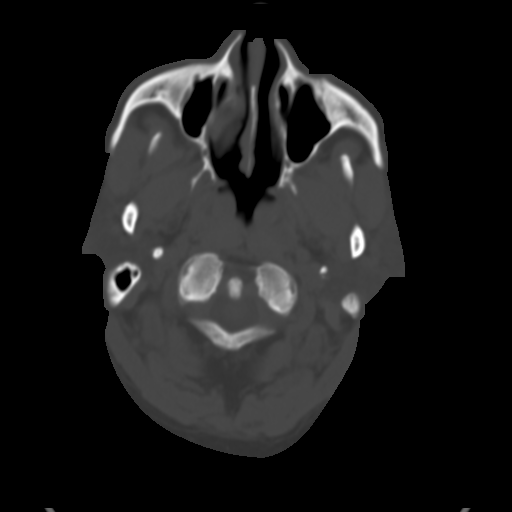
[im 3/33  brain]
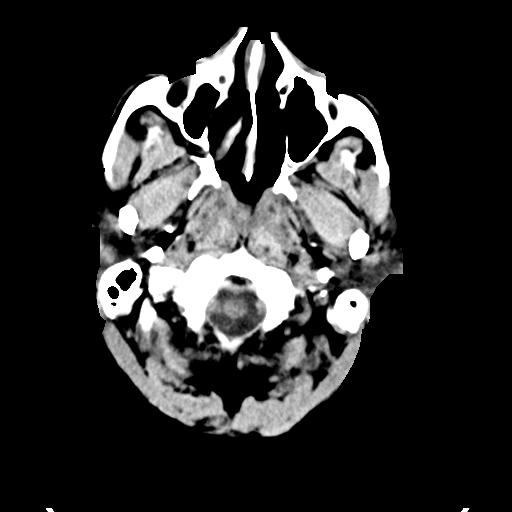
[im 6/33  brain]
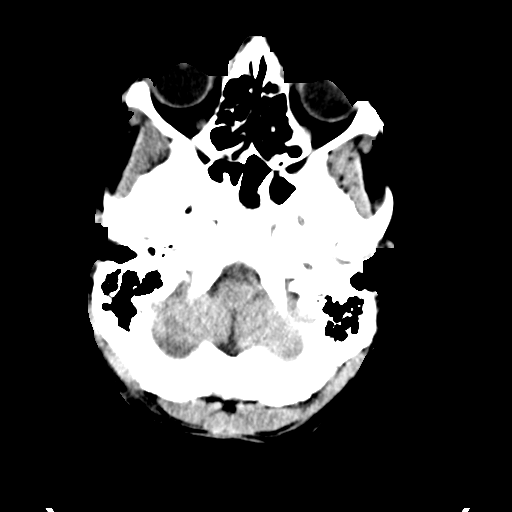
[im 8/33  brain]
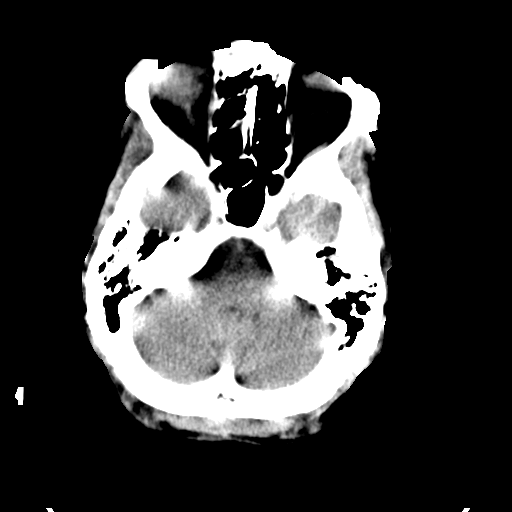
[im 9/33  brain]
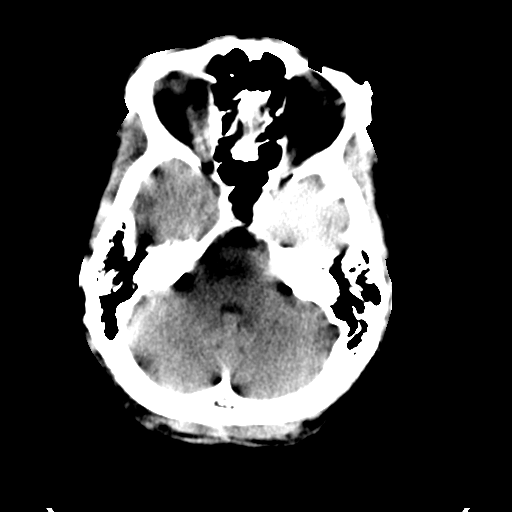
[im 9/33  bone]
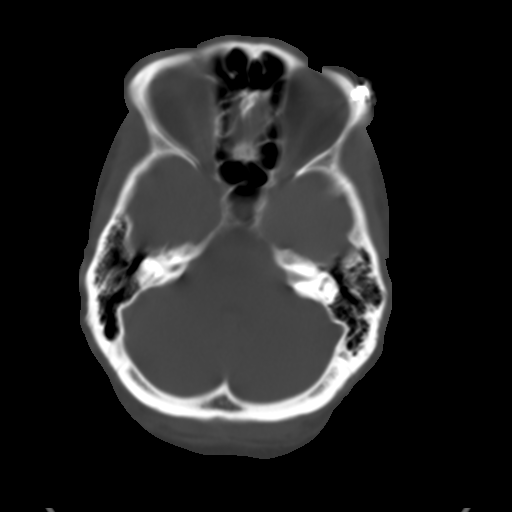
[im 12/33  brain]
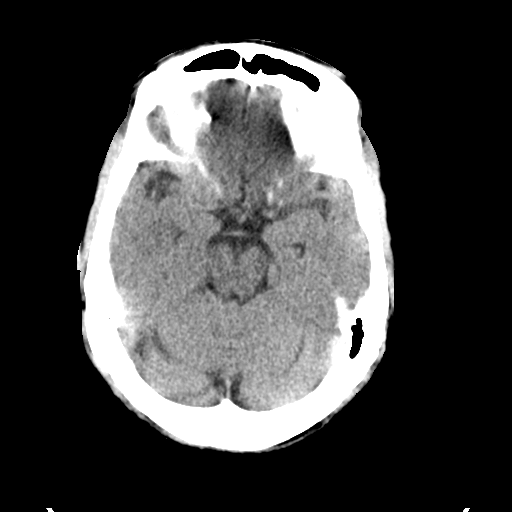
[im 14/33  brain]
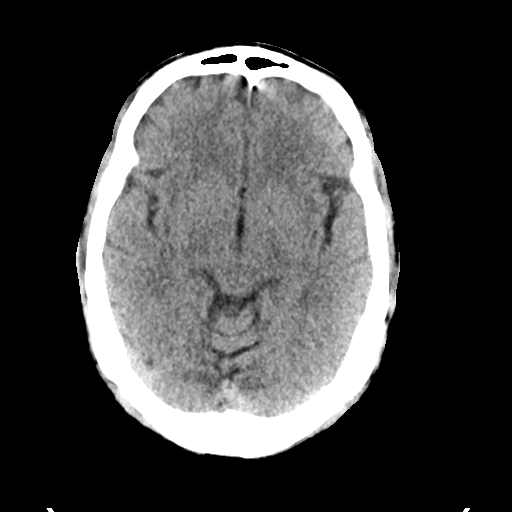
[im 15/33  brain]
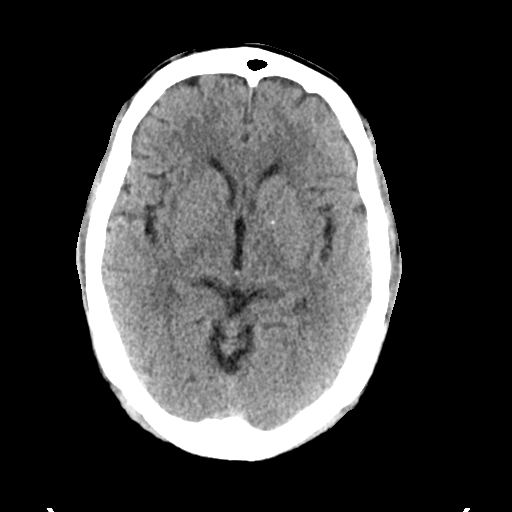
[im 18/33  brain]
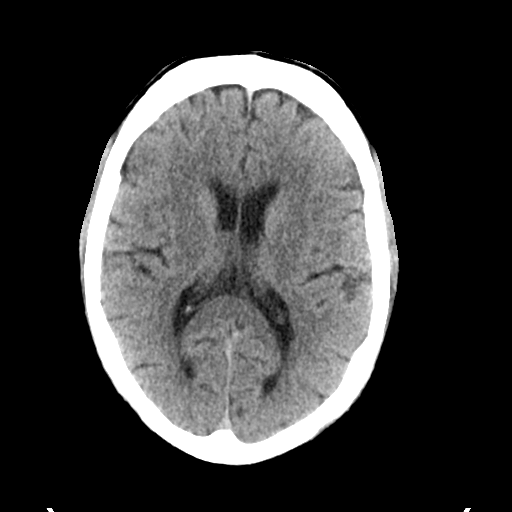
[im 18/33  bone]
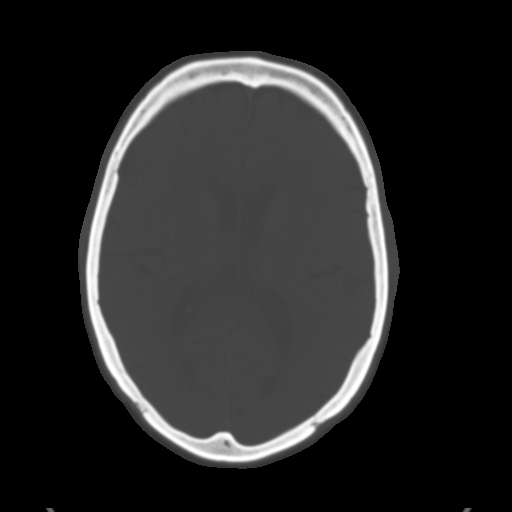
[im 19/33  brain]
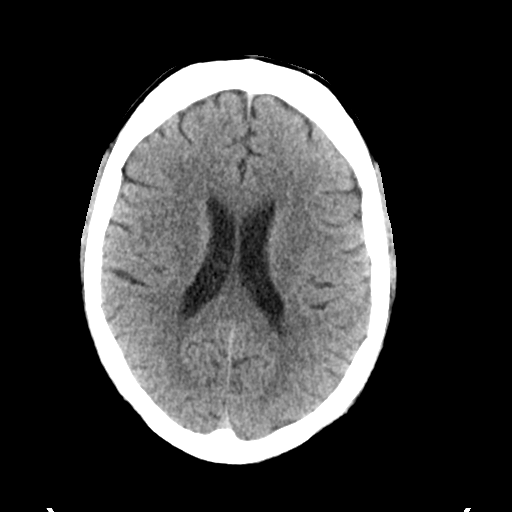
[im 21/33  brain]
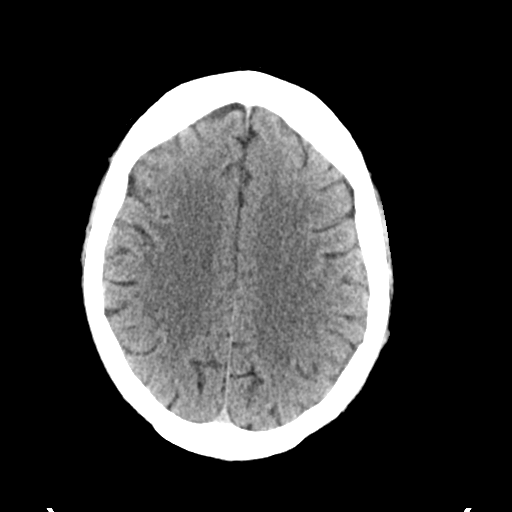
[im 24/33  brain]
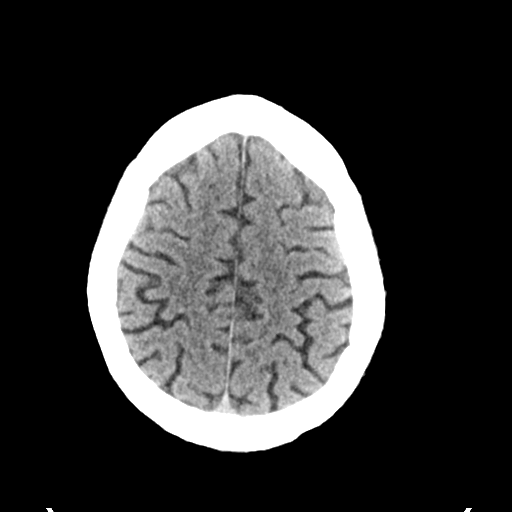
[im 25/33  brain]
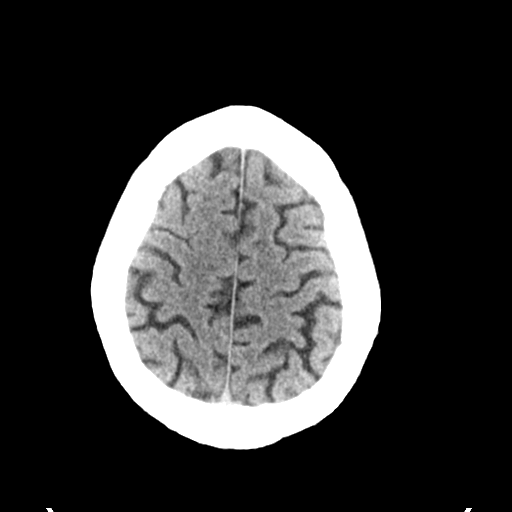
[im 25/33  bone]
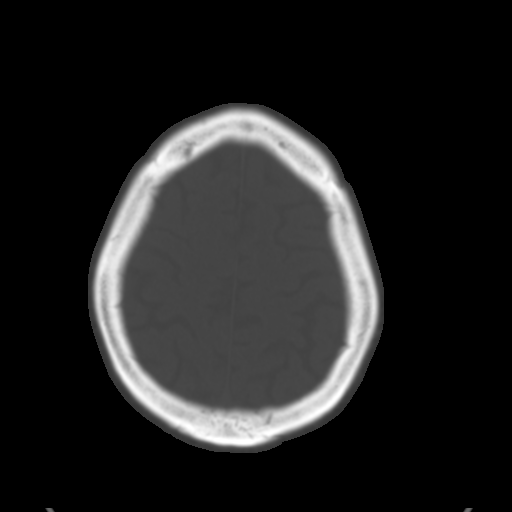
[im 27/33  brain]
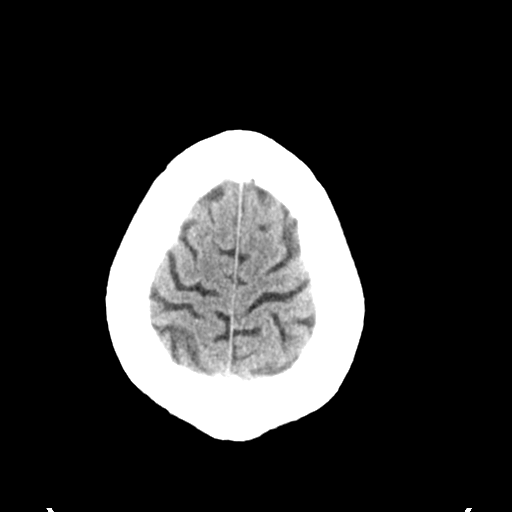
[im 30/33  brain]
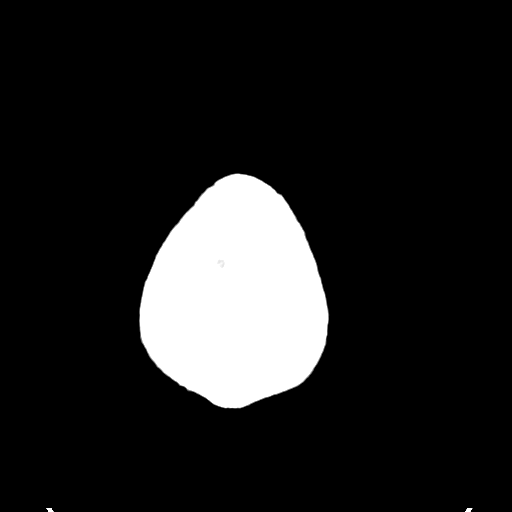
[im 31/33  brain]
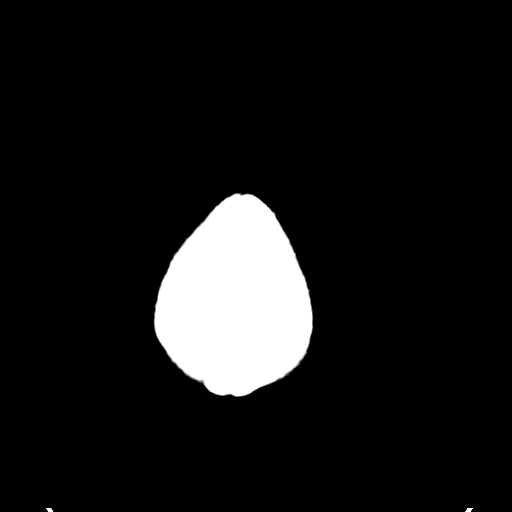

[16 of 30 positions shown; findings below may reference images not displayed]

FINDINGS: Motion artifact limits the study. There is no obvious mass effect,
midline shift, or acute hemorrhage.

Mastoid air cells are clear. Mucosal thickening is present in the
ethmoid air cells. There is no skull fracture.

The pre dens space is widened. Cervical spine injury cannot be
excluded.
IMPRESSION: Exam is limited by motion artifact. No obvious acute intracranial
pathology.

The pre dens space is widened. Cervical spine injury is not
excluded. Cervical spine study is recommended.

## 2017-10-31 ENCOUNTER — Emergency Department (HOSPITAL_COMMUNITY)
Admission: EM | Admit: 2017-10-31 | Discharge: 2017-11-01 | Disposition: A | Discharge status: Home / Self Care | Payer: Medicare Other | Source: Home / Self Care | Attending: Emergency Medicine | Admitting: Emergency Medicine

## 2017-10-31 ENCOUNTER — Encounter (HOSPITAL_COMMUNITY): Payer: Self-pay | Admitting: Emergency Medicine

## 2017-10-31 DIAGNOSIS — R4182 Altered mental status, unspecified: Secondary | ICD-10-CM | POA: Diagnosis not present

## 2017-10-31 DIAGNOSIS — F10920 Alcohol use, unspecified with intoxication, uncomplicated: Secondary | ICD-10-CM

## 2017-10-31 DIAGNOSIS — R443 Hallucinations, unspecified: Secondary | ICD-10-CM

## 2017-10-31 DIAGNOSIS — F250 Schizoaffective disorder, bipolar type: Secondary | ICD-10-CM | POA: Diagnosis not present

## 2017-10-31 LAB — CBC WITH DIFFERENTIAL/PLATELET
ABS IMMATURE GRANULOCYTES: 0.1 10*3/uL (ref 0.0–0.1)
Basophils Absolute: 0.1 10*3/uL (ref 0.0–0.1)
Basophils Relative: 0 %
Eosinophils Absolute: 0 10*3/uL (ref 0.0–0.7)
Eosinophils Relative: 0 %
HEMATOCRIT: 52.4 % — AB (ref 39.0–52.0)
HEMOGLOBIN: 17.5 g/dL — AB (ref 13.0–17.0)
Immature Granulocytes: 1 %
LYMPHS ABS: 2.5 10*3/uL (ref 0.7–4.0)
LYMPHS PCT: 20 %
MCH: 32.6 pg (ref 26.0–34.0)
MCHC: 33.4 g/dL (ref 30.0–36.0)
MCV: 97.6 fL (ref 78.0–100.0)
MONO ABS: 1.1 10*3/uL — AB (ref 0.1–1.0)
MONOS PCT: 9 %
Neutro Abs: 8.8 10*3/uL — ABNORMAL HIGH (ref 1.7–7.7)
Neutrophils Relative %: 70 %
Platelets: 248 10*3/uL (ref 150–400)
RBC: 5.37 MIL/uL (ref 4.22–5.81)
RDW: 12.6 % (ref 11.5–15.5)
WBC: 12.5 10*3/uL — ABNORMAL HIGH (ref 4.0–10.5)

## 2017-10-31 LAB — COMPREHENSIVE METABOLIC PANEL
ALK PHOS: 65 U/L (ref 38–126)
ALT: 18 U/L (ref 0–44)
ANION GAP: 12 (ref 5–15)
AST: 22 U/L (ref 15–41)
Albumin: 4.5 g/dL (ref 3.5–5.0)
BILIRUBIN TOTAL: 1.2 mg/dL (ref 0.3–1.2)
BUN: 13 mg/dL (ref 8–23)
CALCIUM: 9.1 mg/dL (ref 8.9–10.3)
CO2: 27 mmol/L (ref 22–32)
CREATININE: 2.7 mg/dL — AB (ref 0.61–1.24)
Chloride: 99 mmol/L (ref 98–111)
GFR calc Af Amer: 27 mL/min — ABNORMAL LOW (ref >60)
GFR calc non Af Amer: 24 mL/min — ABNORMAL LOW (ref >60)
Glucose, Bld: 136 mg/dL — ABNORMAL HIGH (ref 70–99)
Potassium: 3.8 mmol/L (ref 3.5–5.1)
SODIUM: 138 mmol/L (ref 135–145)
TOTAL PROTEIN: 7.7 g/dL (ref 6.5–8.1)

## 2017-10-31 LAB — ETHANOL

## 2017-10-31 MED ORDER — ZIPRASIDONE HCL 20 MG PO CAPS
20.0000 mg | ORAL_CAPSULE | Freq: Two times a day (BID) | ORAL | Status: DC
  Filled 2017-10-31: qty 1

## 2017-10-31 MED ORDER — SODIUM CHLORIDE 0.9 % IV BOLUS
1000.0000 mL | Freq: Once | INTRAVENOUS | Status: AC
  Administered 2017-10-31: 1000 mL via INTRAVENOUS

## 2017-10-31 NOTE — ED Notes (Signed)
Pt given some water, okay per MD

## 2017-10-31 NOTE — ED Triage Notes (Signed)
Pt presents to ED with friend due to possible seizure/syncopal episode.  ETOH on board.  Patient admits to most day drinking.  Patient denies head injury.  States "I just want to sleep for a little bit".   Patient wants this RN to clearly state in his chart "I won't be doing any DUI tests".  Patient denies pain.

## 2017-10-31 NOTE — ED Provider Notes (Signed)
[facility] EMERGENCY DEPARTMENT Provider Note   CSN: [id number] Arrival date & time: 10/31/17  1840     History   Chief Complaint Chief Complaint  Patient presents with  . Loss of Consciousness  . Alcohol Problem    HPI Timothy Mcmillan is a 62 y.o. male.  62 y/o male with a PMH of HTN,ETOH,MDD presents to the ED chief complaint of loss of consciousness.  Patient states he was drinking heavily for the past 3 days and "it was going to happen because I was drinking so much ".  Vision states he had another syncopal episode again in triage where he feels rushed and then wakes up on the floor.  She reports he was giving the choice of coming into the ED by the police.  Since very intoxicated and unable to provide history, he is a very poor historian.  Reports no pain and states "I am not doing blood work, you check my alcohol "I do not want my family to find out ".  He also reports "there are other psychiatric issues but I want to tell you anything because I am not spending 3 weeks here "unable to provide further history.     Past Medical History:  Diagnosis Date  . Depression   . ETOH abuse   . Hypertension   . RSD (reflex sympathetic dystrophy)     Patient Active Problem List   Diagnosis Date Noted  . Cocaine use disorder, severe, dependence (HCC) 05/25/2016  . Alcohol use disorder, moderate, dependence (HCC) 05/25/2016  . Essential hypertension 05/25/2016  . MDD (major depressive disorder), recurrent severe, without psychosis (HCC) 05/21/2016  . Severe episode of recurrent major depressive disorder, with psychotic features (HCC) 05/16/2016  . Abnormal EKG 03/18/2016  . Memory changes 03/18/2016  . Mild intermittent asthma without complication 03/18/2016  . Pterygium of both eyes 03/18/2016  . Retrograde ejaculation 03/18/2016  . Benign prostatic hyperplasia 03/13/2016  . Bipolar 1 disorder, depressed (HCC) 03/13/2016  . Complex regional pain  syndrome type 1 of left lower extremity 03/13/2016  . Neck pain 03/13/2016  . Alcohol use disorder, severe, dependence (HCC) 02/16/2016  . Substance induced mood disorder (HCC) 10/07/2015  . Cocaine abuse (HCC)   . Alcohol abuse   . Acute renal failure (HCC)   . Hypokalemia 11/14/2014  . Polysubstance abuse (HCC) 11/14/2014  . Alcohol withdrawal delirium (HCC) 11/14/2014  . CAP (community acquired pneumonia) 11/14/2014  . Alcohol withdrawal (HCC) 11/14/2014  . Acute renal failure (ARF) (HCC) 11/14/2014  . Acute encephalopathy   . Reflex sympathetic dystrophy 05/02/2010  . Hypertension, benign 03/21/2002    Past Surgical History:  Procedure Laterality Date  . EYE SURGERY    . HERNIA REPAIR    . surgery to lower L leg          Home Medications    Prior to Admission medications   Medication Sig Start Date End Date Taking? Authorizing Provider  amLODipine (NORVASC) 5 MG tablet Take 1 tablet (5 mg total) by mouth daily. For high blood pressure 05/29/16   Nwoko, Nicole Kindred I, NP  benztropine (COGENTIN) 0.5 MG tablet Take 1 tablet (0.5 mg total) by mouth at bedtime as needed for tremors (for eps , if needed with zyprexa). 05/28/16   Armandina Stammer I, NP  gabapentin (NEURONTIN) 400 MG capsule Take 1 capsule (400 mg total) by mouth 3 (three) times daily. For agitation 05/28/16   Armandina Stammer I, NP  hydrOXYzine (ATARAX/VISTARIL) 50 MG tablet  Take 1 tablet four times daily as needed: For anxiety 05/28/16   Armandina StammerNwoko, Agnes I, NP  lisinopril (PRINIVIL,ZESTRIL) 20 MG tablet Take 1 tablet (20 mg total) by mouth daily. For high blood pressure 05/29/16   Nwoko, Nicole KindredAgnes I, NP  nicotine (NICODERM CQ - DOSED IN MG/24 HOURS) 21 mg/24hr patch Place 1 patch (21 mg total) onto the skin daily. For smoking cessation 05/29/16   Armandina StammerNwoko, Agnes I, NP  OLANZapine (ZYPREXA) 5 MG tablet Take 1 tablet (5 mg total) by mouth at bedtime. For mood control 05/28/16   Armandina StammerNwoko, Agnes I, NP  sertraline (ZOLOFT) 100 MG tablet Take 1 tablet  (100 mg total) by mouth daily. For depression 05/29/16   Armandina StammerNwoko, Agnes I, NP  tamsulosin (FLOMAX) 0.4 MG CAPS capsule Take 1 capsule (0.4 mg total) by mouth 2 (two) times daily. For prostate health 05/28/16   Armandina StammerNwoko, Agnes I, NP  traZODone (DESYREL) 50 MG tablet Take 1 tablet (50 mg) at bedtime: For sleep 05/28/16   Sanjuana KavaNwoko, Agnes I, NP    Family History Family History  Problem Relation Age of Onset  . Diabetes type II Father   . Stroke Father     Social History Social History   Tobacco Use  . Smoking status: Current Every Day Smoker    Packs/day: 0.50    Types: Cigarettes  . Smokeless tobacco: Never Used  . Tobacco comment: declined  Substance Use Topics  . Alcohol use: Yes    Comment: intermittently  . Drug use: Yes    Types: Cocaine, "Crack" cocaine     Allergies   Mold extract [trichophyton]   Review of Systems Review of Systems  Unable to perform ROS: Other (Patient pacing the hallway, he is paranoid, he is a very poor historian and unable to provide further history but reports no pain.)     Physical Exam Updated Vital Signs BP 113/82 (BP Location: Left Arm)   Pulse 91   Temp 98.2 F (36.8 C) (Oral)   Resp 18   SpO2 99%   Physical Exam  Nursing note and vitals reviewed.    ED Treatments / Results  Labs (all labs ordered are listed, but only abnormal results are displayed) Labs Reviewed  CBC WITH DIFFERENTIAL/PLATELET - Abnormal; Notable for the following components:      Result Value   WBC 12.5 (*)    Hemoglobin 17.5 (*)    HCT 52.4 (*)    Neutro Abs 8.8 (*)    Monocytes Absolute 1.1 (*)    All other components within normal limits  COMPREHENSIVE METABOLIC PANEL - Abnormal; Notable for the following components:   Glucose, Bld 136 (*)    Creatinine, Ser 2.70 (*)    GFR calc non Af Amer 24 (*)    GFR calc Af Amer 27 (*)    All other components within normal limits  ETHANOL  RAPID URINE DRUG SCREEN, HOSP PERFORMED  CBG MONITORING, ED    EKG EKG  Interpretation  Date/Time:  Sunday October 31 2017 23:43:30 EDT Ventricular Rate:  69 PR Interval:  154 QRS Duration: 82 QT Interval:  356 QTC Calculation: 381 R Axis:   72 Text Interpretation:  Normal sinus rhythm Septal infarct , age undetermined Abnormal ECG since last tracing no significant change Confirmed by Rolan BuccoBelfi, Melanie [phone]([id number]) on 10/31/2017 11:48:21 PM   Radiology Ct Head Wo Contrast  Result Date: 11/01/2017 CLINICAL DATA:  Altered level of consciousness. Alcohol on board. No reported injury. EXAM: CT HEAD WITHOUT  CONTRAST TECHNIQUE: Contiguous axial images were obtained from the base of the skull through the vertex without intravenous contrast. COMPARISON:  11/14/2014 head CT. FINDINGS: Brain: No evidence of parenchymal hemorrhage or extra-axial fluid collection. No mass lesion, mass effect, or midline shift. No CT evidence of acute infarction. Nonspecific mild subcortical and periventricular white matter hypodensity, most in keeping with chronic small vessel ischemic change. cerebral volume is age appropriate. No ventriculomegaly. Vascular: No acute abnormality. Skull: No evidence of calvarial fracture. Surgical plate with interlocking screws is noted in the superolateral left orbital wall. Sinuses/Orbits: The visualized paranasal sinuses are essentially clear. Other:  The mastoid air cells are unopacified. IMPRESSION: 1. No evidence of acute intracranial abnormality. No evidence of calvarial fracture. 2. Mild chronic small vessel ischemic changes in the cerebral white matter. Electronically Signed   By: Delbert Phenix M.D.   On: 11/01/2017 01:08    Procedures Procedures (including critical care time)  Medications Ordered in ED Medications  ziprasidone (GEODON) capsule 20 mg (has no administration in time range)  sodium chloride 0.9 % bolus 1,000 mL (0 mLs Intravenous Stopped 10/31/17 2122)     Initial Impression / Assessment and Plan / ED Course  I have reviewed the triage  vital signs and the nursing notes.  Pertinent labs & imaging results that were available during my care of the patient were reviewed by me and considered in my medical decision making (see chart for details).    Patient presents complaining of multiple episodes of LOC, he states he's been drinking for the past 3 days.Patient is a very poor historian but reports he's been  On a binge for the past 3 weeks "I am gonna fall because its expected I been drinking". Patient will receive fluids along with obtain baseline blood work, he is ambulatory in the ED. reports he does not want his family to know that he is in the ED, he reports "I want any blood and one the police to get my blood".  He repeatedly asked if there is blood level will be shown to the police as he was going to be detained for [facility].   CBC showed slight elevation of leukocytosis 12.5, he denies any complaints or recent sickness or steriod use in the past. CMP showed creatine 2.70 will give him fluids, he repots hes been drinking for 3 days straight. Alcohol level was < 10.Patient has been sleeping in stretcher having hallucinations, will place call to TTS for eval.  12:54 AM TTS recommends to watch patient overnight and will reassess in the morning. CT Head showed no evidence of acute intracranial abnormality, No evidence of calvarial fracture.  1:11 AM Care transferred to [facility] at shift change.   Final Clinical Impressions(s) / ED Diagnoses   Final diagnoses:  Alcoholic intoxication without complication [facility])  Hallucinations    ED Discharge Orders    None       Claude Manges, PA-C 11/01/17 0112    Rolan Bucco, MD 11/08/17 [phone]

## 2017-10-31 NOTE — Progress Notes (Signed)
Cannot be assessed. Pt falling asleep throughout the assessment. TTS called the pt's name several times and he does not respond.  Princess BruinsAquicha Kierrah Kilbride, MSW, LCSW Therapeutic Triage Specialist  [phone]

## 2017-11-01 ENCOUNTER — Emergency Department (HOSPITAL_COMMUNITY): Payer: Medicare Other

## 2017-11-01 LAB — RAPID URINE DRUG SCREEN, HOSP PERFORMED
Amphetamines: NOT DETECTED
Barbiturates: NOT DETECTED
Benzodiazepines: NOT DETECTED
Cocaine: POSITIVE — AB
Opiates: NOT DETECTED
Tetrahydrocannabinol: NOT DETECTED

## 2017-11-01 LAB — ETHANOL: Alcohol, Ethyl (B): <10 mg/dL (ref <10)

## 2017-11-01 MED ORDER — ONDANSETRON HCL 4 MG PO TABS: 4.0000 mg | ORAL_TABLET | Freq: Three times a day (TID) | ORAL | Status: DC | PRN

## 2017-11-01 MED ORDER — ALUM & MAG HYDROXIDE-SIMETH 200-200-20 MG/5ML PO SUSP: 30.0000 mL | Freq: Four times a day (QID) | ORAL | Status: DC | PRN

## 2017-11-01 NOTE — ED Notes (Signed)
Pt stats he understands instructions . Home stable with steady gait. Pt instructed to not drive states he will call for ride.

## 2017-11-01 NOTE — ED Notes (Signed)
Pt eating breakfast 100% tolerates well

## 2017-11-01 NOTE — ED Notes (Signed)
Pt rouses to voice. C/o hunger and cold. Pt deneis pain. Alert and oriented x 3. MAEW

## 2017-11-01 NOTE — ED Notes (Signed)
Pt eating turkey tray and tolerating well. 

## 2017-11-01 NOTE — ED Notes (Signed)
Patient has been eating and drinking since arrival to ER.  No issues noted.  In ct at this time.

## 2017-11-01 NOTE — ED Provider Notes (Signed)
Patient awake and alert speaking clearly, sitting upright requesting discharge. Patient denies suicidal ideation. He is not overtly intoxicated, is interacting in appropriate manner with nursing staff. He is here voluntarily, has capacity to make this request for discharge.    Gerhard MunchLockwood, Aveya Beal, MD 11/01/17 [phone]

## 2017-11-01 NOTE — Discharge Instructions (Addendum)
As discussed, it is important that you follow up as soon as possible with your physician for continued management of your condition. ° °If you develop any new, or concerning changes in your condition, please return to the emergency department immediately. ° °

## 2017-11-01 NOTE — ED Notes (Signed)
[facility] for patient to be observed overnight and reassessed in the morning.

## 2017-11-01 NOTE — ED Notes (Signed)
md at bedside

## 2017-11-01 NOTE — ED Notes (Signed)
Pt was given another Mal[place] sandwich. Pt still waiting for breakfast tray.

## 2017-11-01 NOTE — Progress Notes (Signed)
Per Donell SievertSpencer Simon, PA pt is recommended for continued observation for safety and stabilization and to be reassessed in the AM by psych. EDP Claude MangesSoto, Johana, PA-C and pt's nurse Campbell RichesLoudermilk, Susanna F, RN have been advised.   Princess BruinsAquicha Hlee Fringer, MSW, LCSW Therapeutic Triage Specialist  [phone]

## 2017-11-01 NOTE — ED Notes (Addendum)
Discussed with pt process and plan for further eval. Pt state he does not want to follow process and will discharge. Same reported to MD. Alert and oriented Pt denies si/hi.

## 2017-11-01 NOTE — BH Assessment (Addendum)
Tele Assessment Note   Patient Name: Timothy PacasChristopher Crapps MRN: [id number] Referring Physician: Claude MangesSoto, Johana, PA-C Location of Patient: [facility] Location of Provider: Behavioral Health TTS Department  Timothy Mcmillan is an 62 y.o. male who presents to the ED voluntarily. TTS asked the pt why he came to the ED and he states "I don't know. I'm dehydrated." When asked for additional information, pt reports he has been increasingly depressed. Pt denies SI, HI, and AVH. Pt is continuously falling asleep throughout the assessment and ED staff has to assist with keeping the pt engaged. Pt states he is depressed and endorses symptoms including isolation, tearfulness, fatigue, and feeling worthless. Pt is adamant that he does not want his family to know he came to the ED. Pt states he has been binge drinking for the past 3 days, however his labs are negative for alcohol. Pt is difficult to comprehend as his speech is slurred and at times inaudible. [facility] in the past due to depression and substance abuse.   Per Donell SievertSpencer Simon, PA pt is recommended for continued observation for safety and stabilization and to be reassessed in the AM by psych. EDP Claude MangesSoto, Johana, PA-C and pt's nurse Campbell RichesLoudermilk, Susanna F, RN have been advised.   Diagnosis: MDD, recurrent, severe, w/o psychosis; Alcohol abuse disorder, severe  Past Medical History:  Past Medical History:  Diagnosis Date  . Depression   . ETOH abuse   . Hypertension   . RSD (reflex sympathetic dystrophy)     Past Surgical History:  Procedure Laterality Date  . EYE SURGERY    . HERNIA REPAIR    . surgery to lower L leg      Family History:  Family History  Problem Relation Age of Onset  . Diabetes type II Father   . Stroke Father     Social History:  reports that he has been smoking cigarettes. He has been smoking about 0.50 packs per day. He has never used smokeless tobacco. He reports that he  drinks alcohol. He reports that he has current or past drug history. Drugs: Cocaine and "Crack" cocaine.  Additional Social History:  Alcohol / Drug Use Pain Medications: See MAR Prescriptions: See MAR Over the Counter: See MAR History of alcohol / drug use?: Yes Longest period of sobriety (when/how long): 3 years per chart review  Negative Consequences of Use: Personal relationships Substance #1 Name of Substance 1: Alcohol 1 - Age of First Use: unknown 1 - Amount (size/oz): pt reports he has been binge drinking for 3 days 1 - Frequency: daily 1 - Duration: ongoing 1 - Last Use / Amount: pt reports 10/31/17, although labs are negative   CIWA: CIWA-Ar BP: 113/82 Pulse Rate: 91 COWS:    Allergies:  Allergies  Allergen Reactions  . Mold Extract [Trichophyton] Shortness Of Breath    Home Medications:  (Not in a hospital admission)  OB/GYN Status:  No LMP for male patient.  General Assessment Data Assessment unable to be completed: Yes Reason for not completing assessment: Cannot be assessed. Pt falling asleep throughout the assessment. TTS called the pt's name several times and he does not respond. [facility] Assessment: In system Is this a Tele or Face-to-Face Assessment?: Tele Assessment Is this an Initial Assessment or a Re-assessment for this encounter?: Initial Assessment Patient Accompanied by:: (alone) Language Other than English: No What gender do you identify as?: Male Marital status: Single Pregnancy Status: No  Living Arrangements: Alone Can pt return to current living arrangement?: Yes Admission Status: Voluntary Is patient capable of signing voluntary admission?: Yes Referral Source: Self/Family/Friend Insurance type: [facility]     Crisis Care Plan Living Arrangements: Alone Name of Psychiatrist: none Name of Therapist: none  Education Status Is patient currently in school?: No Is the patient employed, unemployed or receiving  disability?: (UTA due to AMS)  Risk to self with the past 6 months Suicidal Ideation: No Has patient been a risk to self within the past 6 months prior to admission? : No Suicidal Intent: No Has patient had any suicidal intent within the past 6 months prior to admission? : No Is patient at risk for suicide?: No Suicidal Plan?: No Has patient had any suicidal plan within the past 6 months prior to admission? : No Access to Means: No What has been your use of drugs/alcohol within the last 12 months?: binge drinking on alcohol  Previous Attempts/Gestures: No Triggers for Past Attempts: None known Intentional Self Injurious Behavior: None Family Suicide History: No Recent stressful life event(s): Other (Comment)(pt does not disclose stressors) Persecutory voices/beliefs?: No Depression: Yes Depression Symptoms: Isolating, Guilt, Loss of interest in usual pleasures, Feeling worthless/self pity Substance abuse history and/or treatment for substance abuse?: Yes Suicide prevention information given to non-admitted patients: Not applicable  Risk to Others within the past 6 months Homicidal Ideation: No Does patient have any lifetime risk of violence toward others beyond the six months prior to admission? : No Thoughts of Harm to Others: No Current Homicidal Intent: No Current Homicidal Plan: No Access to Homicidal Means: No History of harm to others?: No Assessment of Violence: None Noted Does patient have access to weapons?: No Criminal Charges Pending?: No Does patient have a court date: No Is patient on probation?: No  Psychosis Hallucinations: None noted Delusions: None noted  Mental Status Report Appearance/Hygiene: Disheveled Eye Contact: Poor Motor Activity: Unsteady Speech: Incoherent, Slurred Level of Consciousness: Drowsy Mood: Labile Affect: Constricted Anxiety Level: None Thought Processes: Thought Blocking Judgement: Impaired Orientation: Person, Place,  Time Obsessive Compulsive Thoughts/Behaviors: None  Cognitive Functioning Concentration: Poor Memory: Remote Impaired, Recent Impaired Is patient IDD: No Insight: Poor Impulse Control: Poor Appetite: Good Have you had any weight changes? : No Change Sleep: Unable to Assess Vegetative Symptoms: None  ADLScreening [facility] Assessment Services) Patient's cognitive ability adequate to safely complete daily activities?: Yes Patient able to express need for assistance with ADLs?: Yes Independently performs ADLs?: Yes (appropriate for developmental age)  Prior Inpatient Therapy Prior Inpatient Therapy: Yes Prior Therapy Dates: 2018 Prior Therapy Facilty/Provider(s): [facility] Reason for Treatment: MDD  Prior Outpatient Therapy Prior Outpatient Therapy: No Does patient have an ACCT team?: No Does patient have Intensive In-House Services?  : No Does patient have [facility] services? : No Does patient have P4CC services?: No  ADL Screening (condition at time of admission) Patient's cognitive ability adequate to safely complete daily activities?: Yes Is the patient deaf or have difficulty hearing?: No Does the patient have difficulty seeing, even when wearing glasses/contacts?: No Does the patient have difficulty concentrating, remembering, or making decisions?: Yes Patient able to express need for assistance with ADLs?: Yes Does the patient have difficulty dressing or bathing?: No Independently performs ADLs?: Yes (appropriate for developmental age) Does the patient have difficulty walking or climbing stairs?: No Weakness of Legs: None Weakness of Arms/Hands: None  Home Assistive Devices/Equipment Home Assistive Devices/Equipment: None    Abuse/Neglect Assessment (Assessment to be complete  while patient is alone) Abuse/Neglect Assessment Can Be Completed: Unable to assess, patient is non-responsive or altered mental status     Advance Directives (For Healthcare) Does Patient  Have a Medical Advance Directive?: No Would patient like information on creating a medical advance directive?: No - Patient declined          Disposition: Per Donell Sievert, PA pt is recommended for continued observation for safety and stabilization and to be reassessed in the AM by psych. EDP Claude Manges, PA-C and pt's nurse Campbell Riches, RN have been advised.   Disposition Initial Assessment Completed for this Encounter: Yes Disposition of Patient: (overnight OBS pending AM psych assessment ) Patient refused recommended treatment: No  This service was provided via telemedicine using a 2-way, interactive audio and video technology.  Names of all persons participating in this telemedicine service and their role in this encounter. Name: [facility] Role: Patient  Name: Princess Bruins Role: TTS          Karolee Ohs 11/01/2017 12:48 AM

## 2017-11-02 ENCOUNTER — Encounter (HOSPITAL_COMMUNITY): Payer: Self-pay | Admitting: Emergency Medicine

## 2017-11-02 ENCOUNTER — Emergency Department (HOSPITAL_COMMUNITY)
Admission: EM | Admit: 2017-11-02 | Discharge: 2017-11-03 | Disposition: A | Discharge status: Other Acute Inpatient Hospital | Payer: Medicare Other | Source: Home / Self Care | Attending: Emergency Medicine | Admitting: Emergency Medicine

## 2017-11-02 DIAGNOSIS — F22 Delusional disorders: Secondary | ICD-10-CM

## 2017-11-02 DIAGNOSIS — R443 Hallucinations, unspecified: Secondary | ICD-10-CM

## 2017-11-02 DIAGNOSIS — R45851 Suicidal ideations: Secondary | ICD-10-CM

## 2017-11-02 DIAGNOSIS — E876 Hypokalemia: Secondary | ICD-10-CM

## 2017-11-02 LAB — COMPREHENSIVE METABOLIC PANEL
ALT: 17 U/L (ref 0–44)
AST: 18 U/L (ref 15–41)
Albumin: 4 g/dL (ref 3.5–5.0)
Alkaline Phosphatase: 55 U/L (ref 38–126)
Anion gap: 11 (ref 5–15)
BUN: 11 mg/dL (ref 8–23)
CO2: 25 mmol/L (ref 22–32)
Calcium: 8.9 mg/dL (ref 8.9–10.3)
Chloride: 104 mmol/L (ref 98–111)
Creatinine, Ser: 1.36 mg/dL — ABNORMAL HIGH (ref 0.61–1.24)
GFR, EST NON AFRICAN AMERICAN: 54 mL/min — AB (ref >60)
Glucose, Bld: 104 mg/dL — ABNORMAL HIGH (ref 70–99)
POTASSIUM: 2.9 mmol/L — AB (ref 3.5–5.1)
Sodium: 140 mmol/L (ref 135–145)
TOTAL PROTEIN: 7.3 g/dL (ref 6.5–8.1)
Total Bilirubin: 1.3 mg/dL — ABNORMAL HIGH (ref 0.3–1.2)

## 2017-11-02 LAB — CBC WITH DIFFERENTIAL/PLATELET
ABS IMMATURE GRANULOCYTES: 0 10*3/uL (ref 0.0–0.1)
BASOS ABS: 0 10*3/uL (ref 0.0–0.1)
BASOS PCT: 1 %
Eosinophils Absolute: 0.1 10*3/uL (ref 0.0–0.7)
Eosinophils Relative: 1 %
HCT: 44.7 % (ref 39.0–52.0)
Hemoglobin: 15.2 g/dL (ref 13.0–17.0)
Immature Granulocytes: 0 %
Lymphocytes Relative: 29 %
Lymphs Abs: 2.5 10*3/uL (ref 0.7–4.0)
MCH: 32.8 pg (ref 26.0–34.0)
MCHC: 34 g/dL (ref 30.0–36.0)
MCV: 96.3 fL (ref 78.0–100.0)
MONO ABS: 0.7 10*3/uL (ref 0.1–1.0)
Monocytes Relative: 8 %
NEUTROS ABS: 5.3 10*3/uL (ref 1.7–7.7)
Neutrophils Relative %: 61 %
PLATELETS: 219 10*3/uL (ref 150–400)
RBC: 4.64 MIL/uL (ref 4.22–5.81)
RDW: 12.7 % (ref 11.5–15.5)
WBC: 8.6 10*3/uL (ref 4.0–10.5)

## 2017-11-02 LAB — ETHANOL

## 2017-11-02 MED ORDER — LISINOPRIL 20 MG PO TABS
20.0000 mg | ORAL_TABLET | Freq: Every day | ORAL | Status: DC
  Administered 2017-11-02: 20 mg via ORAL
  Filled 2017-11-02 (×2): qty 1

## 2017-11-02 MED ORDER — SERTRALINE HCL 50 MG PO TABS
50.0000 mg | ORAL_TABLET | Freq: Every day | ORAL | Status: DC
  Filled 2017-11-02: qty 1

## 2017-11-02 MED ORDER — GABAPENTIN 400 MG PO CAPS
400.0000 mg | ORAL_CAPSULE | Freq: Three times a day (TID) | ORAL | Status: DC
  Administered 2017-11-02: 400 mg via ORAL
  Filled 2017-11-02 (×2): qty 1

## 2017-11-02 MED ORDER — SERTRALINE HCL 100 MG PO TABS: 100.0000 mg | ORAL_TABLET | Freq: Every day | ORAL | Status: DC

## 2017-11-02 MED ORDER — TAMSULOSIN HCL 0.4 MG PO CAPS
0.4000 mg | ORAL_CAPSULE | Freq: Two times a day (BID) | ORAL | Status: DC
  Administered 2017-11-03: 0.4 mg via ORAL
  Filled 2017-11-02: qty 1

## 2017-11-02 MED ORDER — THIAMINE HCL 100 MG/ML IJ SOLN
100.0000 mg | Freq: Every day | INTRAMUSCULAR | Status: DC
  Filled 2017-11-02: qty 2

## 2017-11-02 MED ORDER — LORAZEPAM 1 MG PO TABS
2.0000 mg | ORAL_TABLET | Freq: Once | ORAL | Status: AC
  Administered 2017-11-02: 2 mg via ORAL
  Filled 2017-11-02: qty 2

## 2017-11-02 MED ORDER — LORAZEPAM 2 MG/ML IJ SOLN: 0.0000 mg | Freq: Four times a day (QID) | INTRAMUSCULAR | Status: DC

## 2017-11-02 MED ORDER — OLANZAPINE 5 MG PO TABS
5.0000 mg | ORAL_TABLET | Freq: Every day | ORAL | Status: DC
  Filled 2017-11-02: qty 1

## 2017-11-02 MED ORDER — VITAMIN B-1 100 MG PO TABS
100.0000 mg | ORAL_TABLET | Freq: Every day | ORAL | Status: DC
  Administered 2017-11-02 – 2017-11-03 (×2): 100 mg via ORAL
  Filled 2017-11-02 (×2): qty 1

## 2017-11-02 MED ORDER — POTASSIUM CHLORIDE CRYS ER 20 MEQ PO TBCR: 40.0000 meq | EXTENDED_RELEASE_TABLET | Freq: Once | ORAL | Status: DC

## 2017-11-02 MED ORDER — BENZTROPINE MESYLATE 1 MG PO TABS: 0.5000 mg | ORAL_TABLET | Freq: Every evening | ORAL | Status: DC | PRN

## 2017-11-02 MED ORDER — LORAZEPAM 2 MG/ML IJ SOLN: 0.0000 mg | Freq: Two times a day (BID) | INTRAMUSCULAR | Status: DC

## 2017-11-02 MED ORDER — HYDROXYZINE HCL 10 MG PO TABS
10.0000 mg | ORAL_TABLET | Freq: Four times a day (QID) | ORAL | Status: DC | PRN
  Filled 2017-11-02: qty 1

## 2017-11-02 MED ORDER — LORAZEPAM 1 MG PO TABS
0.0000 mg | ORAL_TABLET | Freq: Four times a day (QID) | ORAL | Status: DC
  Administered 2017-11-03: 1 mg via ORAL
  Filled 2017-11-02 (×2): qty 1

## 2017-11-02 MED ORDER — LORAZEPAM 1 MG PO TABS: 0.0000 mg | ORAL_TABLET | Freq: Two times a day (BID) | ORAL | Status: DC

## 2017-11-02 MED ORDER — TRAZODONE HCL 50 MG PO TABS: 50.0000 mg | ORAL_TABLET | Freq: Every day | ORAL | Status: DC

## 2017-11-02 MED ORDER — NICOTINE 21 MG/24HR TD PT24
21.0000 mg | MEDICATED_PATCH | Freq: Every day | TRANSDERMAL | Status: DC
  Administered 2017-11-02 – 2017-11-03 (×2): 21 mg via TRANSDERMAL
  Filled 2017-11-02 (×2): qty 1

## 2017-11-02 NOTE — ED Notes (Signed)
Attempted to get EKG from pt, but pt continues to change the subject and talk about how he is "seeing things" and that he "needs to see a psychiatrist".  Pt refused all vitals, except for BP. Pt stated that the Dinamap is "not taking the correct blood pressure" because he "does not have high blood pressure". Informed Brooke - Charity fundraiserN.

## 2017-11-02 NOTE — ED Notes (Signed)
Unable to do any initial shift assessments on patient because he is currently sleeping due to Ativan administration for agitation.

## 2017-11-02 NOTE — ED Notes (Addendum)
Pt did not want ekg done when portable was brought

## 2017-11-02 NOTE — ED Notes (Signed)
Staffing contacted and they advised they would send down a sitter.

## 2017-11-02 NOTE — ED Notes (Signed)
Pt was wanded by security.  

## 2017-11-02 NOTE — ED Notes (Addendum)
Pt was given the option to go to Pod E and be seen by a EDP, or go out to the waiting room to use the phone. Pt was not able to focus on decision making, initially walking to Pod E then turning around and stating that he wanted to use the phone in the lobby. Pt also stated "How did the police get my stuff? That ain't right. Ya'll ain't treating me right. Make a note of that". Pt kept attempting to use phone at [facility], but was told that these phones were expressly used for staff communication and emergency use, also confusing ED walkie talkies for phones. Pt eventually pulled out his own phone and walked to lobby with RN Nehemiah SettleBrooke and Kenney Housemananya, EMT.

## 2017-11-02 NOTE — ED Notes (Signed)
Pt stated to this Tech that he wants help. Pt stated, "I will walk out in traffic, if I don't get help". Pt tearful. Informed Caryn BeeKevin - CN.

## 2017-11-02 NOTE — ED Provider Notes (Signed)
Pt signed out at shift change pending labs and TTS consult.  AKI has resolved, but potassium slightly low.  This will be replaced.  Pt medically clear for TTS consult.  They have recommended inpatient admission.  Pt is currently voluntary and awaiting placement.   [name], Halayna Blane, MD 11/02/17 1919
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
## 2017-11-02 NOTE — ED Notes (Signed)
Gave pt water, per Brooke - RN. 

## 2017-11-02 NOTE — ED Notes (Signed)
Pt stated that his head has been telling him things like to hurt himself. I asked him if he wanted to hurt anyone else, he denied. I asked if he has a plan and he said to go out on [address] and stand in front of traffic. Pt was staring at all the wires in the room the whole conversation. All wires were removed, still need to close all cabinets. Pt is refusing vitals, to get undressed and into purple scrubs. Security was called to assist and wand pt.

## 2017-11-02 NOTE — ED Notes (Signed)
All personal items sent with Nelly Routnna Christa ( friend) per patient's request

## 2017-11-02 NOTE — ED Notes (Signed)
Pt refusing blood
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
## 2017-11-03 ENCOUNTER — Other Ambulatory Visit: Payer: Self-pay

## 2017-11-03 ENCOUNTER — Encounter (HOSPITAL_COMMUNITY): Payer: Self-pay | Admitting: *Deleted

## 2017-11-03 ENCOUNTER — Other Ambulatory Visit: Payer: Self-pay | Admitting: Registered Nurse

## 2017-11-03 ENCOUNTER — Inpatient Hospital Stay (HOSPITAL_COMMUNITY)
Admission: AD | Admit: 2017-11-03 | Discharge: 2017-11-05 | DRG: 885 | Disposition: A | Discharge status: Home / Self Care | Payer: Medicare Other | Source: Intra-hospital | Attending: Psychiatry | Admitting: Psychiatry

## 2017-11-03 DIAGNOSIS — R45851 Suicidal ideations: Secondary | ICD-10-CM | POA: Diagnosis present

## 2017-11-03 DIAGNOSIS — Z87898 Personal history of other specified conditions: Secondary | ICD-10-CM | POA: Diagnosis not present

## 2017-11-03 DIAGNOSIS — I10 Essential (primary) hypertension: Secondary | ICD-10-CM | POA: Diagnosis present

## 2017-11-03 DIAGNOSIS — Z818 Family history of other mental and behavioral disorders: Secondary | ICD-10-CM

## 2017-11-03 DIAGNOSIS — Z79899 Other long term (current) drug therapy: Secondary | ICD-10-CM

## 2017-11-03 DIAGNOSIS — F10120 Alcohol abuse with intoxication, uncomplicated: Secondary | ICD-10-CM | POA: Diagnosis present

## 2017-11-03 DIAGNOSIS — F250 Schizoaffective disorder, bipolar type: Secondary | ICD-10-CM | POA: Diagnosis present

## 2017-11-03 DIAGNOSIS — F17210 Nicotine dependence, cigarettes, uncomplicated: Secondary | ICD-10-CM | POA: Diagnosis present

## 2017-11-03 DIAGNOSIS — F1420 Cocaine dependence, uncomplicated: Secondary | ICD-10-CM | POA: Diagnosis present

## 2017-11-03 DIAGNOSIS — F1290 Cannabis use, unspecified, uncomplicated: Secondary | ICD-10-CM | POA: Diagnosis not present

## 2017-11-03 DIAGNOSIS — R4182 Altered mental status, unspecified: Secondary | ICD-10-CM | POA: Diagnosis present

## 2017-11-03 DIAGNOSIS — F410 Panic disorder [episodic paroxysmal anxiety] without agoraphobia: Secondary | ICD-10-CM | POA: Diagnosis present

## 2017-11-03 DIAGNOSIS — G4700 Insomnia, unspecified: Secondary | ICD-10-CM | POA: Diagnosis present

## 2017-11-03 DIAGNOSIS — F1490 Cocaine use, unspecified, uncomplicated: Secondary | ICD-10-CM | POA: Diagnosis not present

## 2017-11-03 DIAGNOSIS — Z23 Encounter for immunization: Secondary | ICD-10-CM

## 2017-11-03 MED ORDER — INFLUENZA VAC SPLIT QUAD 0.5 ML IM SUSY
0.5000 mL | PREFILLED_SYRINGE | INTRAMUSCULAR | Status: AC
  Administered 2017-11-05: 0.5 mL via INTRAMUSCULAR
  Filled 2017-11-03: qty 0.5

## 2017-11-03 MED ORDER — AMLODIPINE BESYLATE 10 MG PO TABS
10.0000 mg | ORAL_TABLET | Freq: Every day | ORAL | Status: DC
  Administered 2017-11-04 – 2017-11-05 (×2): 10 mg via ORAL
  Filled 2017-11-03 (×4): qty 1

## 2017-11-03 MED ORDER — NICOTINE 21 MG/24HR TD PT24
21.0000 mg | MEDICATED_PATCH | Freq: Every day | TRANSDERMAL | Status: DC
  Administered 2017-11-04 – 2017-11-05 (×2): 21 mg via TRANSDERMAL
  Filled 2017-11-03 (×4): qty 1

## 2017-11-03 MED ORDER — HYDROXYZINE HCL 25 MG PO TABS
25.0000 mg | ORAL_TABLET | Freq: Three times a day (TID) | ORAL | Status: DC | PRN
  Administered 2017-11-04: 25 mg via ORAL
  Filled 2017-11-03: qty 1

## 2017-11-03 MED ORDER — TRAZODONE HCL 50 MG PO TABS
50.0000 mg | ORAL_TABLET | Freq: Every evening | ORAL | Status: DC | PRN
  Filled 2017-11-03 (×8): qty 1

## 2017-11-03 NOTE — ED Notes (Signed)
Pt awakened to make aware that food at bedside. Pt rouses and request food be reheated. Same done. Pt eating lunch and toleratibng diet well.

## 2017-11-03 NOTE — ED Notes (Signed)
Pt woke up from sleep, this RN spoke with pt about giving blood and pt consented to giving blood. Blood collected by this RN,assisted by Marylene LandAngela Phlebotomy. Pt went back to sleep after blood draw.

## 2017-11-03 NOTE — ED Notes (Signed)
Attempted to awaken pt as his supper tray had arrived and the pt did not awaken.

## 2017-11-03 NOTE — Progress Notes (Signed)
Timothy Mcmillan is a 62 year old male pt admitted on voluntary basis. On admission, he reports that he has been feeling depressed and anxious recently and did speak about how he has been feeling suicidal. He denies SI currently and is able to contract for safety in the hospital. He spoke about how he has been feeling paranoid and spoke about how he has been having difficulties with distinguishing what is real and what is not. He does endorse alcohol and cocaine usage and reports that it does make his paranoia worse. He reports that when he was here last year he did take his medications for awhile when he got discharged but then stopped. He reports that he lives by himself and reports that he will return to the same living situation upon discharge. He was escorted to the unit, oriented to the milieu and safety maintained.

## 2017-11-03 NOTE — ED Notes (Signed)
Made one final attempt to awaken pt for medication administration and nutrition without success. Left pt alone to get sleep and rest.

## 2017-11-03 NOTE — Tx Team (Signed)
Initial Treatment Plan 11/03/2017 6:50 PM Timothy Pacashristopher Ouzts RUE:[id number]:[id number]    PATIENT STRESSORS: Medication change or noncompliance Substance abuse   PATIENT STRENGTHS: Ability for insight Average or above average intelligence General fund of knowledge   PATIENT IDENTIFIED PROBLEMS: Depressed Suicidal thoughts Paranoia "Need to get a handle on my depression and anxiety"                     DISCHARGE CRITERIA:  Ability to meet basic life and health needs Improved stabilization in mood, thinking, and/or behavior Verbal commitment to aftercare and medication compliance  PRELIMINARY DISCHARGE PLAN: Attend aftercare/continuing care group Return to previous living arrangement  PATIENT/FAMILY INVOLVEMENT: This treatment plan has been presented to and reviewed with the patient, Timothy PacasChristopher Mcmillan, and/or family member, .  The patient and family have been given the opportunity to ask questions and make suggestions.  Sohan Potvin, Timothy Mcmillan, [place]RN 11/03/2017, 6:50 PM

## 2017-11-03 NOTE — ED Notes (Signed)
Pt being transferrd to behavorial  By [facility]

## 2017-11-03 NOTE — ED Notes (Signed)
Lunch ordered for pt

## 2017-11-03 NOTE — ED Notes (Signed)
Dr Messner at bedside 

## 2017-11-03 NOTE — ED Notes (Signed)
Pt wakes and begins eating breakfast. Pt notes he has had blood drawn and becomes abngry . States he did not give consent for lab draw. States" how could I fucking consent when I am asleep?' Charge murse Caryn Bee speaks with pt.

## 2017-11-03 NOTE — ED Notes (Signed)
Pt requesting to see RN that drew his blood. This RN in room to speak with pt. Pt upset that his blood was drawn yesterday. Pt notified that he gave consent to blood draw. Pt denies doing this but does not remember blood draw. Pt states "i'm not going to talk with you more about this, I will put it in writing" Security outside of room.

## 2017-11-03 NOTE — ED Notes (Signed)
[facility] has been called  To transport this pt to behavorial

## 2017-11-03 NOTE — ED Notes (Signed)
Pt wanted to file a compliant with administration regarding his care. Pt spoke with the administrative coordinator and said "that was a productive conversation".

## 2017-11-03 NOTE — ED Notes (Signed)
Attempted to awaken the pt for medications and nutrition however he still would not wake up. Pt continued to sleep and snore.

## 2017-11-03 NOTE — ED Notes (Addendum)
Medications offered and discussed with patient. Patient states he will take some of his medications. Dr. Erin Hearing now at bedside.

## 2017-11-03 NOTE — ED Notes (Signed)
Pt has awakened after sleeping for 11+ hours. Pt is condescending, belligerent, and rude. Pt wants to know everyone's name, especially the person who drew his blood because he states "I did not give permission for my blood to be drawn." Pt states nobody offered him food however attempts were made to awaken him however he did not wake up as documented.

## 2017-11-03 NOTE — Progress Notes (Signed)
Pt accepted to [facility], Bed 300-1  Shuvon Rankin, NP is the accepting provider.  Landry Mellow, MD is the attending provider.  Call report to [phone]  Aldean Jewett, RN, @[facility] notified.   Pt is Voluntary.  Pt may be transported by [facility]  Pt scheduled  to arrive at [facility] @15 :00  Timmothy Euler. Kaylyn Lim, MSW, LCSWA Disposition Clinical Social Work [phone] (cell) [phone] (office)

## 2017-11-03 NOTE — ED Notes (Addendum)
When asked if pt has thoughts of hurting self pt resonds that he called the ambulance. When noted to pt that it was reported that he had come in with [employer] and EMS Pt respond that "That's a lie", You are such a lier. " Pt then wants the full name of whomever reported this information. Pt again call this nurse a lier when I stated that I did not have information right available. ( No computer at bedside). Pt request that I leave room. Same done.

## 2017-11-03 NOTE — ED Notes (Signed)
Attempted to awaken pt for evening snack and he would not wake up. Pt remained asleep and snoring.
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
## 2017-11-04 LAB — LIPID PANEL
Cholesterol: 143 mg/dL (ref 0–200)
HDL: 35 mg/dL — ABNORMAL LOW (ref >40)
LDL CALC: 89 mg/dL (ref 0–99)
Total CHOL/HDL Ratio: 4.1 RATIO
Triglycerides: 95 mg/dL (ref <150)
VLDL: 19 mg/dL (ref 0–40)

## 2017-11-04 LAB — BASIC METABOLIC PANEL
Anion gap: 6 (ref 5–15)
BUN: 19 mg/dL (ref 8–23)
CALCIUM: 8.4 mg/dL — AB (ref 8.9–10.3)
CO2: 28 mmol/L (ref 22–32)
CREATININE: 1.37 mg/dL — AB (ref 0.61–1.24)
Chloride: 109 mmol/L (ref 98–111)
GFR calc Af Amer: >60 mL/min (ref >60)
GFR, EST NON AFRICAN AMERICAN: 53 mL/min — AB (ref >60)
GLUCOSE: 103 mg/dL — AB (ref 70–99)
Potassium: 3.8 mmol/L (ref 3.5–5.1)
Sodium: 143 mmol/L (ref 135–145)

## 2017-11-04 LAB — HEMOGLOBIN A1C
HEMOGLOBIN A1C: 6 % — AB (ref 4.8–5.6)
Mean Plasma Glucose: 125.5 mg/dL

## 2017-11-04 LAB — TSH: TSH: 0.879 u[IU]/mL (ref 0.350–4.500)

## 2017-11-04 LAB — GLUCOSE, CAPILLARY: GLUCOSE-CAPILLARY: 91 mg/dL (ref 70–99)

## 2017-11-04 MED ORDER — TAMSULOSIN HCL 0.4 MG PO CAPS
0.4000 mg | ORAL_CAPSULE | Freq: Two times a day (BID) | ORAL | Status: DC
  Administered 2017-11-04 – 2017-11-05 (×2): 0.4 mg via ORAL
  Filled 2017-11-04 (×6): qty 1

## 2017-11-04 MED ORDER — BUSPIRONE HCL 5 MG PO TABS
5.0000 mg | ORAL_TABLET | Freq: Two times a day (BID) | ORAL | Status: DC
  Administered 2017-11-04 – 2017-11-05 (×3): 5 mg via ORAL
  Filled 2017-11-04 (×7): qty 1

## 2017-11-04 MED ORDER — SERTRALINE HCL 25 MG PO TABS
25.0000 mg | ORAL_TABLET | Freq: Every day | ORAL | Status: DC
  Administered 2017-11-04: 25 mg via ORAL
  Filled 2017-11-04 (×2): qty 1

## 2017-11-04 MED ORDER — OLANZAPINE 2.5 MG PO TABS
2.5000 mg | ORAL_TABLET | Freq: Every day | ORAL | Status: DC
  Administered 2017-11-04: 2.5 mg via ORAL
  Filled 2017-11-04 (×3): qty 1

## 2017-11-04 MED ORDER — SERTRALINE HCL 50 MG PO TABS
50.0000 mg | ORAL_TABLET | Freq: Every day | ORAL | Status: DC
  Administered 2017-11-05: 50 mg via ORAL
  Filled 2017-11-04 (×3): qty 1

## 2017-11-04 MED ORDER — TAMSULOSIN HCL 0.4 MG PO CAPS
0.4000 mg | ORAL_CAPSULE | Freq: Every day | ORAL | Status: DC
  Administered 2017-11-04: 0.4 mg via ORAL
  Filled 2017-11-04 (×2): qty 1
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
## 2017-11-04 NOTE — Progress Notes (Signed)
Patient ID: Timothy Mcmillan, male   DOB: 12-21-1955, 62 y.o.   MRN: [id number]  Nursing Progress Note [id number]  Data: Patient presents with labile mood and is intrusive at times but cooperative on the unit this morning. Patient initially asking for computer access because "I am a student and I still have to do my work". Patient complaint with scheduled medications and requests his Flomax and sleep medication to be restarted. Patient denies pain/physical complaints. BP elevated this morning; patient has been restarted on his Norvasc. Patient completed self-inventory sheet and rates depression, hopelessness, and anxiety 7,4,7 respectively. Patient rates their sleep and appetite as fair/fair respectively. Patient states goal for today is to "go home". Patient is seen attending groups and visible in the milieu. Patient currently denies SI/HI/AVH.   Action: Patient is educated about and provided medication per provider's orders. Patient safety maintained with q15 min safety checks and frequent rounding. Low fall risk precautions in place. Emotional support given. 1:1 interaction and active listening provided. Patient encouraged to attend meals, groups, and work on treatment plan and goals. Labs, vital signs and patient behavior monitored throughout shift.   Response: Patient remains safe on the unit at this time and agrees to come to staff with any issues/concerns. Patient is interacting with peers appropriately on the unit. Will continue to support and monitor.

## 2017-11-04 NOTE — BHH Group Notes (Signed)
LCSW Group Therapy Note   11/04/2017 1:15pm   Type of Therapy and Topic:  Group Therapy:  Overcoming Obstacles   Participation Level:  Did Not Attend--pt invited. Chose to remain in bed.    Description of Group:    In this group patients will be encouraged to explore what they see as obstacles to their own wellness and recovery. They will be guided to discuss their thoughts, feelings, and behaviors related to these obstacles. The group will process together ways to cope with barriers, with attention given to specific choices patients can make. Each patient will be challenged to identify changes they are motivated to make in order to overcome their obstacles. This group will be process-oriented, with patients participating in exploration of their own experiences as well as giving and receiving support and challenge from other group members.   Therapeutic Goals: 1. Patient will identify personal and current obstacles as they relate to admission. 2. Patient will identify barriers that currently interfere with their wellness or overcoming obstacles.  3. Patient will identify feelings, thought process and behaviors related to these barriers. 4. Patient will identify two changes they are willing to make to overcome these obstacles:      Summary of Patient Progress   x   Therapeutic Modalities:   Cognitive Behavioral Therapy Solution Focused Therapy Motivational Interviewing Relapse Prevention Therapy  Rona Ravens, LCSW 11/04/2017 2:37 PM
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
## 2017-11-04 NOTE — Progress Notes (Signed)
BHH Group Notes:  (Nursing/MHT/Case Management/Adjunct)  Date:  11/04/2017  Time:  2045  Type of Therapy:  wrap up group  Participation Level:  Active  Participation Quality:  Appropriate, Attentive, Sharing and Supportive  Affect:  Irritable  Cognitive:  Appropriate  Insight:  Improving  Engagement in Group:  Supportive  Modes of Intervention:  Clarification, Education and Support  Summary of Progress/Problems: Patient shares that if he could change any one thing about his life it would be to have taken better care of his relationships, romantic, familial, and platonic. Pt is grateful for the grace of god.   Johann Capers S 11/04/2017, 10:07 PM

## 2017-11-04 NOTE — H&P (Signed)
Psychiatric Admission Assessment Adult  Patient Identification: Timothy Mcmillan  MRN:  [id number]  Date of Evaluation:  11/04/2017  Chief Complaint:  SCHIZOAFFECTIVE DISORDER; BIPOLAR TYPE ALCOHOL USE DISORDER COCAINE USE DISORDER  Principal Diagnosis:  Diagnosis:   Patient Active Problem List   Diagnosis Date Noted  . Schizoaffective disorder, bipolar type ([place]) [F25.0] 11/03/2017  . Cocaine use disorder, severe, dependence ([place]) [F14.20] 05/25/2016  . Alcohol use disorder, moderate, dependence ([place]) [F10.20] 05/25/2016  . Essential hypertension [I10] 05/25/2016  . MDD (major depressive disorder), recurrent severe, without psychosis ([place]) [F33.2] 05/21/2016  . Severe episode of recurrent major depressive disorder, with psychotic features ([place]) [F33.3] 05/16/2016  . Abnormal EKG [R94.31] 03/18/2016  . Memory changes [R41.3] 03/18/2016  . Mild intermittent asthma without complication [G26.94] 85/46/2703  . Pterygium of both eyes [H11.003] 03/18/2016  . 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 Benign prostatic hyperplasia [N40.0] 03/13/2016  . Bipolar 1 disorder, depressed ([place]) [F31.9] 03/13/2016  . Complex regional pain syndrome type 1 of left lower extremity [G90.522] 03/13/2016  . Neck pain [M54.2] 03/13/2016  . Alcohol use disorder, severe, dependence ([place]) [F10.20] 02/16/2016  . Substance induced mood disorder ([place]) [F19.94] 10/07/2015  . Cocaine abuse ([place]) [F14.10]   . Alcohol abuse [F10.10]   . Acute renal failure ([place]) [N17.9]   . Hypokalemia [E87.6] 11/14/2014  . Polysubstance abuse ([place]) [F19.10] 11/14/2014  . Alcohol withdrawal delirium ([place]) [F10.231] 11/14/2014  . CAP (community acquired pneumonia) [J18.9] 11/14/2014  . Alcohol withdrawal ([place]) [F10.239] 11/14/2014  . Acute renal failure (ARF) ([place]) [N17.9] 11/14/2014  . Acute encephalopathy [G93.40]   . Reflex sympathetic dystrophy [G90.50] 05/02/2010  . Hypertension, benign [I10] 03/21/2002    History of Present Illness: (Per Md's admission SRA): Patient is a 62 year old male with a reported history of depression and anxiety who was found on the side of the road with his car parked in the middle of the road.  Patient stated that he had had some alcohol and cocaine that day.  The patient was having some paranoid thoughts and thinking someone was trying to get him.  He stated at that time he was having a hard time determining what was real and what was not.  He was transferred to our facility for evaluation and stabilization.  The patient stated that he did not believe that the cocaine or alcohol was a primary problem.  He stated that he had been sober for 2 years prior to the last 2 days.  The patient stated he wanted to get his primary problems taken care of and those would be anxiety and depression.  He also stated that he had panic attacks.  He stated he had been treated with several medications in the past, and thought that Zoloft and Atarax were the most effective.  He admitted to 2 previous suicide attempts in the past.  [facility].  The patient stated he had been taking the Zoloft, but ran out and did not see anyone as an outpatient.  His admission in April 2018 had similar circumstances.  He had been intoxicated with cocaine at presentation and was having suicidal thoughts at that time.  He was discharged on Cogentin, gabapentin, hydroxyzine, olanzapine and sertraline.  He was admitted to the hospital for evaluation and stabilization.    Associated Signs/Symptoms:  Depression Symptoms:  depressed mood, insomnia, anxiety,  (Hypo) Manic Symptoms:  Impulsivity,  Anxiety Symptoms:  Excessive Worry,  Psychotic  Symptoms:  Currently denies any hallucinations, delusional thoughts or paranoia. However, admist hx of auditory hallucinations.  PTSD Symptoms: Denies any PTSD symptoms or events.  Total Time  spent with patient: 1 hour  Past Psychiatric History: Major depressive disorder, Substance abuse/dependence.  Is the patient at risk to self? No.  Has the patient been a risk to self in the past 6 months? No.  Has the patient been a risk to self within the distant past? Yes.    Is the patient a risk to others? No.  Has the patient been a risk to others in the past 6 months? No.  Has the patient been a risk to others within the distant past? No.   Prior Inpatient Therapy: [place].  Prior Outpatient Therapy: Yes    Alcohol Screening: 1. How often do you have a drink containing alcohol?: 4 or more times a week 2. How many drinks containing alcohol do you have on a typical day when you are drinking?: 7, 8, or 9 3. How often do you have six or more drinks on one occasion?: Daily or almost daily AUDIT-C Score: 11 4. How often during the last year have you found that you were not able to stop drinking once you had started?: Daily or almost daily 5. How often during the last year have you failed to do what was normally expected from you becasue of drinking?: Never 6. How often during the last year have you needed a first drink in the morning to get yourself going after a heavy drinking session?: Never 7. How often during the last year have you had a feeling of guilt of remorse after drinking?: Never 8. How often during the last year have you been unable to remember what happened the night before because you had been drinking?: Never 9. Have you or someone else been injured as a result of your drinking?: No 10. Has a relative or friend or a doctor or another health worker been concerned about your drinking or suggested you cut down?: No Alcohol Use Disorder Identification Test Final Score (AUDIT): 15 Intervention/Follow-up: Alcohol Education  Substance Abuse History in the last 12 months:  Yes.    Consequences of Substance Abuse: Medical Consequences:  Liver  damage, Possible death by overdose Legal Consequences:  Arrests, jail time, Loss of driving privilege. Family Consequences:  Family discord, divorce and or separation.  Previous Psychotropic Medications: Yes, (Wellbutrin, Prozac, Sertraline, Abilify, Depakote, Seroquel).  Psychological Evaluations: No   Past Medical History:  Past Medical History:  Diagnosis Date  . Depression   . ETOH abuse   . Hypertension   . RSD (reflex sympathetic dystrophy)     Past Surgical History:  Procedure Laterality Date  . EYE SURGERY    . HERNIA REPAIR    . surgery to lower L leg     Family History:  Family History  Problem Relation Age of Onset  . Diabetes type II Father   . Stroke Father    Family Psychiatric  History: Patient reports maternal family of major depression (Maternal grandmother & uncles are all crazy).  Tobacco Screening: Have you used any form of tobacco in the last 30 days? (Cigarettes, Smokeless Tobacco, Cigars, and/or Pipes): Yes Tobacco use, Select all that apply: 5 or more cigarettes per day Are you interested in Tobacco Cessation Medications?: Yes, will notify MD for an order Counseled patient on smoking cessation including recognizing danger situations, developing coping skills and basic information about quitting  provided: Refused/Declined practical counseling  Social History: Patient admit drug use. He described himself as a binge drug addict. Social History   Substance and Sexual Activity  Alcohol Use Yes   Comment: intermittently     Social History   Substance and Sexual Activity  Drug Use Yes  . Types: Cocaine, "Crack" cocaine    Additional Social History:  Allergies:   Allergies  Allergen Reactions  . Mold Extract [Trichophyton] Shortness Of Breath   Lab Results:  Results for orders placed or performed during the hospital encounter of 11/03/17 (from the past 48 hour(s))  Basic metabolic panel     Status: Abnormal   Collection Time: 11/04/17  6:31 AM   Result Value Ref Range   Sodium 143 135 - 145 mmol/L   Potassium 3.8 3.5 - 5.1 mmol/L   Chloride 109 98 - 111 mmol/L   CO2 28 22 - 32 mmol/L   Glucose, Bld 103 (H) 70 - 99 mg/dL   BUN 19 8 - 23 mg/dL   Creatinine, Ser 1.37 (H) 0.61 - 1.24 mg/dL   Calcium 8.4 (L) 8.9 - 10.3 mg/dL   GFR calc non Af Amer 53 (L) >60 mL/min   GFR calc Af Amer >60 >60 mL/min    Comment: (NOTE) The eGFR has been calculated using the CKD EPI equation. This calculation has not been validated in all clinical situations. eGFR's persistently <60 mL/min signify possible Chronic Kidney Disease.    Anion gap 6 5 - 15    Comment: Performed at [facility], [place] [address], [place], [place] [address]  Lipid panel     Status: Abnormal   Collection Time: 11/04/17  6:31 AM  Result Value Ref Range   Cholesterol 143 0 - 200 mg/dL   Triglycerides 95 <150 mg/dL   HDL 35 (L) >40 mg/dL   Total CHOL/HDL Ratio 4.1 RATIO   VLDL 19 0 - 40 mg/dL   LDL Cholesterol 89 0 - 99 mg/dL    Comment:        Total Cholesterol/HDL:CHD Risk Coronary Heart Disease Risk Table                     Men   Women  1/2 Average Risk   3.4   3.3  Average Risk       5.0   4.4  2 X Average Risk   9.6   7.1  3 X Average Risk  23.4   11.0        Use the calculated Patient Ratio above and the CHD Risk Table to determine the patient's CHD Risk.        ATP III CLASSIFICATION (LDL):  <100     mg/dL   Optimal  100-129  mg/dL   Near or Above                    Optimal  130-159  mg/dL   Borderline  160-189  mg/dL   High  >190     mg/dL   Very High Performed at [facility] [address] [place], [place] [address]   TSH     Status: None   Collection Time: 11/04/17  6:31 AM  Result Value Ref Range   TSH 0.879 0.350 - 4.500 uIU/mL    Comment: Performed by a 3rd Generation assay with a functional sensitivity of <=0.01 uIU/mL. Performed at [facility], [place] [address] [place],  [place]  [address]   Hemoglobin A1c     Status: Abnormal   Collection Time: 11/04/17  6:31 AM  Result Value Ref Range   Hgb A1c MFr Bld 6.0 (H) 4.8 - 5.6 %    Comment: (NOTE) Pre diabetes:          5.7%-6.4% Diabetes:              >6.4% Glycemic control for   <7.0% adults with diabetes    Mean Plasma Glucose 125.5 mg/dL    Comment: Performed at [facility] [address] [place], [place] [address]   Blood Alcohol level:  Lab Results  Component Value Date   ETH <10 11/02/2017   ETH <10 30/86/5784   Metabolic Disorder Labs:  Lab Results  Component Value Date   HGBA1C 6.0 (H) 11/04/2017   MPG 125.5 11/04/2017   MPG 114 05/27/2016   Lab Results  Component Value Date   PROLACTIN 30.3 (H) 05/27/2016   Lab Results  Component Value Date   CHOL 143 11/04/2017   TRIG 95 11/04/2017   HDL 35 (L) 11/04/2017   CHOLHDL 4.1 11/04/2017   VLDL 19 11/04/2017   LDLCALC 89 11/04/2017   LDLCALC 78 05/27/2016   Current Medications: Current Facility-Administered Medications  Medication Dose Route Frequency Provider Last Rate Last Dose  . amLODipine (NORVASC) tablet 10 mg  10 mg Oral Daily Lindon Romp A, NP   10 mg at 11/04/17 0856  . hydrOXYzine (ATARAX/VISTARIL) tablet 25 mg  25 mg Oral TID PRN [name], NP   25 mg at 11/04/17 0858  . Influenza vac split quadrivalent PF (FLUARIX) injection 0.5 mL  0.5 mL Intramuscular Tomorrow-1000 Mallie Darting, Cordie Grice, MD      . nicotine (NICODERM CQ - dosed in mg/24 hours) patch 21 mg  21 mg Transdermal Daily Sharma Covert, MD   21 mg at 11/04/17 0857  . sertraline (ZOLOFT) tablet 25 mg  25 mg Oral Daily Sharma Covert, MD      . tamsulosin [facility]) capsule 0.4 mg  0.4 mg Oral QPC supper Sharma Covert, MD      . traZODone (DESYREL) tablet 50 mg  50 mg Oral QHS,MR X 1 Lindon Romp A, NP       PTA Medications: Medications Prior to Admission  Medication Sig Dispense Refill Last Dose  . albuterol (PROAIR HFA) 108 (90 Base) MCG/ACT  inhaler Inhale 1-2 puffs into the lungs every 4 (four) hours as needed for wheezing or shortness of breath.   unk  . amLODipine (NORVASC) 5 MG tablet Take 1 tablet (5 mg total) by mouth daily. For high blood pressure (Patient taking differently: Take 10 mg by mouth daily. For high blood pressure) 30 tablet 0 unk  . benztropine (COGENTIN) 0.5 MG tablet Take 1 tablet (0.5 mg total) by mouth at bedtime as needed for tremors (for eps , if needed with zyprexa). 30 tablet 0 unk  . gabapentin (NEURONTIN) 400 MG capsule Take 1 capsule (400 mg total) by mouth 3 (three) times daily. For agitation 90 capsule 0 Taking  . hydrOXYzine (ATARAX/VISTARIL) 50 MG tablet Take 1 tablet four times daily as needed: For anxiety 60 tablet 0 unk  . lisinopril (PRINIVIL,ZESTRIL) 20 MG tablet Take 1 tablet (20 mg total) by mouth daily. For high blood pressure 30 tablet 0 unk  . nicotine (NICODERM CQ - DOSED IN MG/24 HOURS) 21 mg/24hr patch Place 1 patch (21 mg total) onto the skin daily. For  smoking cessation 28 patch 0 unk  . OLANZapine (ZYPREXA) 5 MG tablet Take 1 tablet (5 mg total) by mouth at bedtime. For mood control 30 tablet 0 unk  . sertraline (ZOLOFT) 100 MG tablet Take 1 tablet (100 mg total) by mouth daily. For depression 30 tablet 0 unk  . tamsulosin (FLOMAX) 0.4 MG CAPS capsule Take 1 capsule (0.4 mg total) by mouth 2 (two) times daily. For prostate health 30 capsule 0 unk  . traZODone (DESYREL) 50 MG tablet Take 1 tablet (50 mg) at bedtime: For sleep 30 tablet 0 unk   Musculoskeletal: Strength & Muscle Tone: within normal limits Gait & Station: normal Patient leans: N/A  Psychiatric Specialty Exam: Physical Exam  Nursing note and vitals reviewed. Constitutional: He appears well-developed.  HENT:  Head: Normocephalic.  Eyes: Pupils are equal, round, and reactive to light.  Neck: Normal range of motion.  Cardiovascular: Normal rate.  Hx. HTN  Respiratory: Effort normal.  GI: Soft.  Genitourinary:   Genitourinary Comments: Hx. BPH. Currently on Flomax.  Musculoskeletal: Normal range of motion.  Neurological: He is alert.  Skin: Skin is warm.    Review of Systems  Constitutional: Positive for malaise/fatigue.  HENT: Negative.   Eyes: Negative.   Respiratory: Negative.  Negative for cough and shortness of breath.   Cardiovascular: Negative.  Negative for chest pain and palpitations.  Gastrointestinal: Negative for abdominal pain, nausea and vomiting.  Genitourinary: Negative.   Musculoskeletal: Negative.   Skin: Negative.   Neurological: Negative.   Endo/Heme/Allergies: Negative.   Psychiatric/Behavioral: Positive for depression and substance abuse ( UDS (+) for CocaineUDS (+) for Cocaine). Negative for hallucinations, memory loss and suicidal ideas. The patient is nervous/anxious and has insomnia.     Blood pressure (!) 148/99, pulse 68, temperature 97.9 F (36.6 C), temperature source Oral, resp. rate 20, height 6' 3"  (1.905 m), weight 105.2 kg.Body mass index is 29 kg/m.  General Appearance: Casual  Eye Contact:  Fair  Speech:  Normal Rate  Volume:  Normal  Mood:  Anxious, Dysphoric and Irritable  Affect:  Congruent  Thought Process:  Coherent and Descriptions of Associations: Circumstantial  Orientation:  Full (Time, Place, and Person)  Thought Content:  Delusions  Suicidal Thoughts:  Yes.  without intent/plan  Homicidal Thoughts:  No  Memory:  Immediate;   Fair Recent;   Fair Remote;   Fair  Judgement:  Impaired  Insight:  Lacking  Psychomotor Activity:  Increased  Concentration:  Concentration: Fair and Attention Span: Fair  Recall:  [employer] of Knowledge:  Fair  Language:  Good  Akathisia:  Negative  Handed:  Right  AIMS (if indicated):     Assets:  Communication Skills Desire for Improvement Housing Resilience  ADL's:  Intact  Cognition:  WNL  Sleep:  Number of Hours: 5.75       Treatment Plan/Recommendations: 1. Admit for crisis management and  stabilization, estimated length of stay 3-5 days.   2. Medication management to reduce current symptoms to base line and improve the patient's overall level of functioning: See MAR, Md's SRA & treatment plan.   Observation Level/Precautions:  15 minute checks  Laboratory:  Per ED, UDS positive for Cocaine  Psychotherapy: Group sessions   Medications: See MAR  Consultations: As needed   Discharge Concerns: Safety, mood stability  Estimated LOS: 2-4 days  Other: Admitted to the [facility].    Physician Treatment Plan for Primary Diagnosis:  Long Term Goal(s): Improvement in symptoms so as  ready for discharge  Short Term Goals: Ability to identify changes in lifestyle to reduce recurrence of condition will improve, Ability to verbalize feelings will improve and Ability to disclose and discuss suicidal ideas  Physician Treatment Plan for Secondary Diagnosis: Active Problems:   Schizoaffective disorder, bipolar type ([place])  Long Term Goal(s): Improvement in symptoms so as ready for discharge  Short Term Goals: Ability to identify and develop effective coping behaviors will improve, Compliance with prescribed medications will improve and Ability to identify triggers associated with substance abuse/mental health issues will improve  I certify that inpatient services furnished can reasonably be expected to improve the patient's condition.    Lindell Spar, NP, PMHNP, FNP-BC 9/26/201910:08 AM

## 2017-11-04 NOTE — [facility] Suicide Risk Assessment (Signed)
[facility] INPATIENT:  Family/Significant Other Suicide Prevention Education  Suicide Prevention: Nelly Rout, girlfriend [phone]) has been identified by the patient as the family member/significant other with whom the patient will be residing, and identified as the person(s) who will aid the patient in the event of a mental health crisis.  With written consent from the patient, two attempts were made to provide suicide prevention education, prior to and/or following the patient's discharge.  We were unsuccessful in providing suicide prevention education.  A suicide education pamphlet was given to the patient to share with family/significant other.  Date and time of first attempt:11/04/17 / 2:40pm   [name] 11/04/2017, 2:40 PM

## 2017-11-04 NOTE — [facility] Suicide Risk Assessment (Signed)
[facility] INPATIENT:  Family/Significant Other Suicide Prevention Education  Suicide Prevention Education:  Education Completed; Timothy Mcmillan, girlfriend [phone]) has been identified by the patient as the family member/significant other with whom the patient will be residing, and identified as the person(s) who will aid the patient in the event of a mental health crisis (suicidal ideations/suicide attempt).  With written consent from the patient, the family member/significant other has been provided the following suicide prevention education, prior to the and/or following the discharge of the patient.  The suicide prevention education provided includes the following:  Suicide risk factors  Suicide prevention and interventions  National Suicide Hotline telephone number  [facility] assessment telephone number  [facility] Emergency Assistance 911  [facility] and/or Residential Mobile Crisis Unit telephone number  Request made of family/significant other to:  Remove weapons (e.g., guns, rifles, knives), all items previously/currently identified as safety concern.    Remove drugs/medications (over-the-counter, prescriptions, illicit drugs), all items previously/currently identified as a safety concern.  The family member/significant other verbalizes understanding of the suicide prevention education information provided.  The family member/significant other agrees to remove the items of safety concern listed above.  Timothy Mcmillan 11/04/2017, 3:48 PM

## 2017-11-05 DIAGNOSIS — F1490 Cocaine use, unspecified, uncomplicated: Secondary | ICD-10-CM

## 2017-11-05 DIAGNOSIS — F1290 Cannabis use, unspecified, uncomplicated: Secondary | ICD-10-CM

## 2017-11-05 DIAGNOSIS — F250 Schizoaffective disorder, bipolar type: Principal | ICD-10-CM

## 2017-11-05 DIAGNOSIS — F17210 Nicotine dependence, cigarettes, uncomplicated: Secondary | ICD-10-CM

## 2017-11-05 LAB — GLUCOSE, CAPILLARY: GLUCOSE-CAPILLARY: 86 mg/dL (ref 70–99)

## 2017-11-05 MED ORDER — OLANZAPINE 2.5 MG PO TABS: 2.5000 mg | ORAL_TABLET | Freq: Every day | ORAL | 0 refills | Status: AC

## 2017-11-05 MED ORDER — LORAZEPAM 0.5 MG PO TABS: 0.5000 mg | ORAL_TABLET | Freq: Once | ORAL | Status: DC

## 2017-11-05 MED ORDER — HYDROXYZINE HCL 25 MG PO TABS: 25.0000 mg | ORAL_TABLET | Freq: Three times a day (TID) | ORAL | 0 refills | Status: AC | PRN

## 2017-11-05 MED ORDER — TRAZODONE HCL 50 MG PO TABS: 50.0000 mg | ORAL_TABLET | Freq: Every evening | ORAL | 0 refills | Status: AC | PRN

## 2017-11-05 MED ORDER — BUSPIRONE HCL 5 MG PO TABS: 5.0000 mg | ORAL_TABLET | Freq: Two times a day (BID) | ORAL | 0 refills | Status: AC

## 2017-11-05 MED ORDER — CLONIDINE HCL 0.1 MG PO TABS: 0.1000 mg | ORAL_TABLET | Freq: Three times a day (TID) | ORAL | Status: DC | PRN

## 2017-11-05 MED ORDER — ALBUTEROL SULFATE HFA 108 (90 BASE) MCG/ACT IN AERS: 1.0000 | INHALATION_SPRAY | RESPIRATORY_TRACT | Status: AC | PRN

## 2017-11-05 MED ORDER — TAMSULOSIN HCL 0.4 MG PO CAPS: 0.4000 mg | ORAL_CAPSULE | Freq: Two times a day (BID) | ORAL | Status: AC

## 2017-11-05 MED ORDER — AMLODIPINE BESYLATE 10 MG PO TABS: 10.0000 mg | ORAL_TABLET | Freq: Every day | ORAL | 0 refills | Status: AC

## 2017-11-05 MED ORDER — NICOTINE 21 MG/24HR TD PT24: 21.0000 mg | MEDICATED_PATCH | Freq: Every day | TRANSDERMAL | 0 refills | Status: AC

## 2017-11-05 MED ORDER — SERTRALINE HCL 50 MG PO TABS: 50.0000 mg | ORAL_TABLET | Freq: Every day | ORAL | 0 refills | Status: AC

## 2017-11-05 NOTE — Plan of Care (Signed)
Discharge Note  Patient verbalizes readiness for discharge. Follow up plan explained, AVS, Transition record and SRA given. Prescriptions and teaching provided. Belongings returned and signed for. Suicide safety plan completed and signed. Patient verbalizes understanding. Patient denies SI/HI and assures this Clinical research associate he will seek assistance should that change. Patient discharged to lobby where family was waiting.  Problem: Education: Goal: Knowledge of [facility] Education information/materials will improve Outcome: Adequate for Discharge Goal: Emotional status will improve Outcome: Adequate for Discharge Goal: Mental status will improve Outcome: Adequate for Discharge Goal: Verbalization of understanding the information provided will improve Outcome: Adequate for Discharge   Problem: Activity: Goal: Interest or engagement in activities will improve Outcome: Adequate for Discharge Goal: Sleeping patterns will improve Outcome: Adequate for Discharge   Problem: Coping: Goal: Ability to verbalize frustrations and anger appropriately will improve Outcome: Adequate for Discharge Goal: Ability to demonstrate self-control will improve Outcome: Adequate for Discharge   Problem: Health Behavior/Discharge Planning: Goal: Identification of resources available to assist in meeting health care needs will improve Outcome: Adequate for Discharge Goal: Compliance with treatment plan for underlying cause of condition will improve Outcome: Adequate for Discharge   Problem: Physical Regulation: Goal: Ability to maintain clinical measurements within normal limits will improve Outcome: Adequate for Discharge   Problem: Safety: Goal: Periods of time without injury will increase Outcome: Adequate for Discharge   Problem: Education: Goal: Utilization of techniques to improve thought processes will improve Outcome: Adequate for Discharge Goal: Knowledge of the prescribed therapeutic regimen  will improve Outcome: Adequate for Discharge   Problem: Activity: Goal: Interest or engagement in leisure activities will improve Outcome: Adequate for Discharge Goal: Imbalance in normal sleep/wake cycle will improve Outcome: Adequate for Discharge   Problem: Coping: Goal: Coping ability will improve Outcome: Adequate for Discharge Goal: Will verbalize feelings Outcome: Adequate for Discharge   Problem: Health Behavior/Discharge Planning: Goal: Ability to make decisions will improve Outcome: Adequate for Discharge Goal: Compliance with therapeutic regimen will improve Outcome: Adequate for Discharge   Problem: Role Relationship: Goal: Will demonstrate positive changes in social behaviors and relationships Outcome: Adequate for Discharge   Problem: Safety: Goal: Ability to disclose and discuss suicidal ideas will improve Outcome: Adequate for Discharge Goal: Ability to identify and utilize support systems that promote safety will improve Outcome: Adequate for Discharge   Problem: Self-Concept: Goal: Will verbalize positive feelings about self Outcome: Adequate for Discharge Goal: Level of anxiety will decrease Outcome: Adequate for Discharge   Problem: Education: Goal: Ability to make informed decisions regarding treatment will improve Outcome: Adequate for Discharge   Problem: Coping: Goal: Coping ability will improve Outcome: Adequate for Discharge   Problem: Health Behavior/Discharge Planning: Goal: Identification of resources available to assist in meeting health care needs will improve Outcome: Adequate for Discharge   Problem: Medication: Goal: Compliance with prescribed medication regimen will improve Outcome: Adequate for Discharge   Problem: Self-Concept: Goal: Ability to disclose and discuss suicidal ideas will improve Outcome: Adequate for Discharge Goal: Will verbalize positive feelings about self Outcome: Adequate for Discharge   Problem:  Activity: Goal: Will verbalize the importance of balancing activity with adequate rest periods Outcome: Adequate for Discharge   Problem: Education: Goal: Will be free of psychotic symptoms Outcome: Adequate for Discharge Goal: Knowledge of the prescribed therapeutic regimen will improve Outcome: Adequate for Discharge   Problem: Coping: Goal: Coping ability will improve Outcome: Adequate for Discharge Goal: Will verbalize feelings Outcome: Adequate for Discharge   Problem: Health Behavior/Discharge  Planning: Goal: Compliance with prescribed medication regimen will improve Outcome: Adequate for Discharge   Problem: Nutritional: Goal: Ability to achieve adequate nutritional intake will improve Outcome: Adequate for Discharge   Problem: Role Relationship: Goal: Ability to communicate needs accurately will improve Outcome: Adequate for Discharge Goal: Ability to interact with others will improve Outcome: Adequate for Discharge   Problem: Safety: Goal: Ability to redirect hostility and anger into socially appropriate behaviors will improve Outcome: Adequate for Discharge Goal: Ability to remain free from injury will improve Outcome: Adequate for Discharge   Problem: Self-Care: Goal: Ability to participate in self-care as condition permits will improve Outcome: Adequate for Discharge   Problem: Self-Concept: Goal: Will verbalize positive feelings about self Outcome: Adequate for Discharge   Problem: Education: Goal: Knowledge of General Education information will improve Description Including pain rating scale, medication(s)/side effects and non-pharmacologic comfort measures Outcome: Adequate for Discharge   Problem: Health Behavior/Discharge Planning: Goal: Ability to manage health-related needs will improve Outcome: Adequate for Discharge   Problem: Coping: Goal: Level of anxiety will decrease Outcome: Adequate for Discharge   Problem: Safety: Goal: Ability to  remain free from injury will improve Outcome: Adequate for Discharge

## 2017-11-05 NOTE — Progress Notes (Signed)
D: Patient denies SI, HI or AVH this evening. Patient presents as flat, depressed and anxious but otherwise cooperative.  Pt. States that he is doing "ok", he forwards little during assessment but is attentive.  Pt. Is visualized on the phone and in the dayroom with minimal peer interaction.  Pt. Denies any physical complaints, states his appetite is good and declined medication for sleep stating that he sleeps fine on his own.    A: Patient given emotional support from RN. Patient encouraged to come to staff with concerns and/or questions. Patient's medication routine continued. Patient's orders and plan of care reviewed.   R: Patient remains appropriate and cooperative. Will continue to monitor patient q15 minutes for safety.

## 2017-11-05 NOTE — Progress Notes (Addendum)
Recreation Therapy Notes  Date: 9.27.19 Time: 0930 Location: [address] Dayroom  Group Topic: Stress Management  Goal Area(s) Addresses:  Patient will verbalize importance of using healthy stress management.  Patient will identify positive emotions associated with healthy stress management.   Behavioral Response: Engaged  Intervention: Stress Management  Activity :  Meditation.  LRT introduced the stress management technique of meditation.  LRT played a meditation that focused on being resilient in the face of obstacles.  Patients were to follow along as meditation played.  Education:  Stress Management, Discharge Planning.   Education Outcome: Acknowledges edcuation/In group clarification offered/Needs additional education  Clinical Observations/Feedback: Pt attended and participated in group.      Caroll Rancher, LRT/CTRS        Caroll Rancher A 11/05/2017 12:43 PM

## 2017-11-05 NOTE — [facility] Suicide Risk Assessment (Signed)
[facility] Discharge Suicide Risk Assessment   Principal Problem: <principal problem not specified> Discharge Diagnoses:  Patient Active Problem List   Diagnosis Date Noted  . Schizoaffective disorder, bipolar type (HCC) [F25.0] 11/03/2017  . Cocaine use disorder, severe, dependence (HCC) [F14.20] 05/25/2016  . Alcohol use disorder, moderate, dependence (HCC) [F10.20] 05/25/2016  . Essential hypertension [I10] 05/25/2016  . MDD (major depressive disorder), recurrent severe, without psychosis (HCC) [F33.2] 05/21/2016  . Severe episode of recurrent major depressive disorder, with psychotic features (HCC) [F33.3] 05/16/2016  . Abnormal EKG [R94.31] 03/18/2016  . Memory changes [R41.3] 03/18/2016  . Mild intermittent asthma without complication [J45.20] 03/18/2016  . Pterygium of both eyes [H11.003] 03/18/2016  . 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 Benign prostatic hyperplasia [N40.0] 03/13/2016  . Bipolar 1 disorder, depressed (HCC) [F31.9] 03/13/2016  . Complex regional pain syndrome type 1 of left lower extremity [G90.522] 03/13/2016  . Neck pain [M54.2] 03/13/2016  . Alcohol use disorder, severe, dependence (HCC) [F10.20] 02/16/2016  . Substance induced mood disorder (HCC) [F19.94] 10/07/2015  . Cocaine abuse (HCC) [F14.10]   . Alcohol abuse [F10.10]   . Acute renal failure (HCC) [N17.9]   . Hypokalemia [E87.6] 11/14/2014  . Polysubstance abuse (HCC) [F19.10] 11/14/2014  . Alcohol withdrawal delirium (HCC) [F10.231] 11/14/2014  . CAP (community acquired pneumonia) [J18.9] 11/14/2014  . Alcohol withdrawal (HCC) [F10.239] 11/14/2014  . Acute renal failure (ARF) (HCC) [N17.9] 11/14/2014  . Acute encephalopathy [G93.40]   . Reflex sympathetic dystrophy [G90.50] 05/02/2010  . Hypertension, benign [I10] 03/21/2002    Total Time spent with patient: 20 minutes  Musculoskeletal: Strength & Muscle Tone: within normal limits Gait & Station: normal Patient leans: N/A  Psychiatric  Specialty Exam: Review of Systems  All other systems reviewed and are negative.   Blood pressure (!) 192/98, pulse 68, temperature 97.9 F (36.6 C), temperature source Oral, resp. rate 20, height 6\' 3"  (1.905 m), weight 105.2 kg.Body mass index is 29 kg/m.  General Appearance: Casual  Eye Contact::  Good  Speech:  Normal Rate409  Volume:  Normal  Mood:  Euthymic  Affect:  Congruent  Thought Process:  Coherent and Descriptions of Associations: Intact  Orientation:  Full (Time, Place, and Person)  Thought Content:  Logical  Suicidal Thoughts:  No  Homicidal Thoughts:  No  Memory:  Immediate;   Fair Recent;   Fair Remote;   Fair  Judgement:  Intact  Insight:  Fair  Psychomotor Activity:  Normal  Concentration:  Negative  Recall:  [employer] of Knowledge:Fair  Language: Fair  Akathisia:  Negative  Handed:  Right  AIMS (if indicated):     Assets:  Communication Skills Desire for Improvement Financial Resources/Insurance Housing Intimacy Leisure Time Physical Health Resilience Social Support Talents/Skills  Sleep:  Number of Hours: 5.75  Cognition: WNL  ADL's:  Intact   Mental Status Per Nursing Assessment::   On Admission:  Self-harm thoughts  Demographic Factors:  Male, Divorced or widowed and Low socioeconomic status  Loss Factors: NA  Historical Factors: Impulsivity  Risk Reduction Factors:   Employed, Living with another person, especially a relative, Positive social support and Positive coping skills or problem solving skills  Continued Clinical Symptoms:  Depression:   Comorbid alcohol abuse/dependence Impulsivity Alcohol/Substance Abuse/Dependencies  Cognitive Features That Contribute To Risk:  None    Suicide Risk:  Minimal: No identifiable suicidal ideation.  Patients presenting with no risk factors but with morbid ruminations; may be classified as minimal risk based on the  severity of the depressive symptoms  Follow-up Information    [facility] Go on 11/15/2017.   Why:  Appointment for medication management services is Monday, 11/15/17 at 2:00pm. Please be sure to bring your Photo ID, any insurance information and any discharge paperwork from this hospitalization.  Contact information: [address] [place], [place], [address]  Phone: [phone] Fax: [phone]       [facility] Go to.   Specialty:  Professional Counselor Why:  For therapy servcies, walk in hours are Monday- Friday from 8:30am-12:00pm - 1:00pm-2:30pm. Please be sure to bring your Photo ID, SSN, any insurance information, and any discharge paperwork from this hospitalization.  Contact information: [facility] [address] [place] [address] [phone]           Plan Of Care/Follow-up recommendations:  Activity:  ad lib  Antonieta Pert, MD 11/05/2017, 12:03 PM

## 2017-11-05 NOTE — [facility] Group Notes (Signed)
Adult Psychoeducational Group Note  Date:  11/05/2017 Time:  11:04 AM  Group Topic/Focus:  Goals Group:   The focus of this group is to help patients establish daily goals to achieve during treatment and discuss how the patient can incorporate goal setting into their daily lives to aide in recovery.  Participation Level:  Active  Participation Quality:  Appropriate  Affect:  Appropriate  Cognitive:  Appropriate  Insight: Appropriate  Engagement in Group:  Engaged  Modes of Intervention:  Activity  Additional Comments:   Gar Gibbon 11/05/2017, 11:04 AM

## 2017-11-05 NOTE — Discharge Summary (Signed)
Physician Discharge Summary Note  Patient:  Timothy Mcmillan is an 62 y.o., male  MRN:  [id number]  DOB:  1955/09/10  Patient phone:  [phone] (home)   Patient address:   [address] [place] [address],   Total Time spent with patient: Greater than 30 minutes  Date of Admission:  11/03/2017  Date of Discharge: 11-05-17  Reason for Admission: Paranoid ideations.  Principal Problem: Schizoaffective disorder, bipolar type Timothy [facility])  Discharge Diagnoses: Patient Active Problem List   Diagnosis Date Noted  . Schizoaffective disorder, bipolar type (HCC) [F25.0] 11/03/2017    Priority: High  . Cocaine use disorder, severe, dependence (HCC) [F14.20] 05/25/2016    Priority: Medium  . Alcohol use disorder, moderate, dependence (HCC) [F10.20] 05/25/2016  . Essential hypertension [I10] 05/25/2016  . MDD (major depressive disorder), recurrent severe, without psychosis (HCC) [F33.2] 05/21/2016  . Severe episode of recurrent major depressive disorder, with psychotic features (HCC) [F33.3] 05/16/2016  . Abnormal EKG [R94.31] 03/18/2016  . Memory changes [R41.3] 03/18/2016  . Mild intermittent asthma without complication [J45.20] 03/18/2016  . Pterygium of both eyes [H11.003] 03/18/2016  . 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 Benign prostatic hyperplasia [N40.0] 03/13/2016  . Bipolar 1 disorder, depressed (HCC) [F31.9] 03/13/2016  . Complex regional pain syndrome type 1 of left lower extremity [G90.522] 03/13/2016  . Neck pain [M54.2] 03/13/2016  . Alcohol use disorder, severe, dependence (HCC) [F10.20] 02/16/2016  . Substance induced mood disorder (HCC) [F19.94] 10/07/2015  . Cocaine abuse (HCC) [F14.10]   . Alcohol abuse [F10.10]   . Acute renal failure (HCC) [N17.9]   . Hypokalemia [E87.6] 11/14/2014  . Polysubstance abuse (HCC) [F19.10] 11/14/2014  . Alcohol withdrawal delirium (HCC) [F10.231] 11/14/2014  . CAP (community acquired pneumonia)  [J18.9] 11/14/2014  . Alcohol withdrawal (HCC) [F10.239] 11/14/2014  . Acute renal failure (ARF) (HCC) [N17.9] 11/14/2014  . Acute encephalopathy [G93.40]   . Reflex sympathetic dystrophy [G90.50] 05/02/2010  . Hypertension, benign [I10] 03/21/2002   Past Psychiatric History: Hx. Polysubstance dependence  Past Medical History:  Past Medical History:  Diagnosis Date  . Depression   . ETOH abuse   . Hypertension   . RSD (reflex sympathetic dystrophy)     Past Surgical History:  Procedure Laterality Date  . EYE SURGERY    . HERNIA REPAIR    . surgery to lower L leg     Family History:  Family History  Problem Relation Age of Onset  . Diabetes type II Father   . Stroke Father    Family Psychiatric  History: See Md's SRA  Social History:  Social History   Substance and Sexual Activity  Alcohol Use Yes   Comment: intermittently     Social History   Substance and Sexual Activity  Drug Use Yes  . Types: Cocaine, "Crack" cocaine    Social History   Socioeconomic History  . Marital status: Legally Separated    Spouse name: Not on file  . Number of children: Not on file  . Years of education: Not on file  . Highest education level: Not on file  Occupational History  . Not on file  Social Needs  . Financial resource strain: Not on file  . Food insecurity:    Worry: Not on file    Inability: Not on file  . Transportation needs:    Medical: Not on file    Non-medical: Not on file  Tobacco Use  . Smoking status: Current Every Day Smoker  Packs/day: 0.50    Types: Cigarettes  . Smokeless tobacco: Never Used  . Tobacco comment: declined  Substance and Sexual Activity  . Alcohol use: Yes    Comment: intermittently  . Drug use: Yes    Types: Cocaine, "Crack" cocaine  . Sexual activity: Not Currently    Birth control/protection: None  Lifestyle  . Physical activity:    Days per week: Not on file    Minutes per session: Not on file  . Stress: Not on file   Relationships  . Social connections:    Talks on phone: Not on file    Gets together: Not on file    Attends religious service: Not on file    Active member of club or organization: Not on file    Attends meetings of clubs or organizations: Not on file    Relationship status: Not on file  Other Topics Concern  . Not on file  Social History Narrative  . Not on file   Hospital Course: (Per Md's admission SRA): Patient is a 62 year old male with a reported history of depression and anxiety who was found on the side of the road with his car parked in the middle of the road. Patient stated that he had had some alcohol and cocaine that day. The patient was having some paranoid thoughts and thinking someone was trying to get him. He stated at that time he was having a hard time determining what was real and what was not. He was transferred to our facility for evaluation and stabilization. The patient stated that he did not believe that the cocaine or alcohol was a primary problem. He stated that he had been sober for 2 years prior to the last 2 days. The patient stated he wanted to get his primary problems taken care of and those would be anxiety and depression.He also stated that he had panic attacks. He stated he had been treated with several medications in the past, and thought that Zoloft and Atarax were the most effective.He admitted to 2 previous suicide attempts in the past. [facility]. The patient stated he had been taking the Zoloft, but ran out and did not see anyone as an outpatient.  During the course of this present hospitalization, Ivon's presenting symptoms were noted. The medication management for his presenting symptoms were discussed & with his consent initiated. He reported that he had previously done well on Zoloft & Hydroxyzine. He was then medicated & discharged on;  Buspar 5 mg bid  for anxiety, Hydroxyzine 25 mg prn for anxiety, Nicotine patch 21 mg for smoking cessation, Olanzapine 2.5 mg for mood control, Sertraline 50 mg for depression & Trazodone 50 mg for insomnia. He was also enrolled & participated in the group sessions being offered & held on this unit. He learned coping skills. He was also resumed on all his pertinent home medications for his other pre-existing medical issues presented. He tolerated his treatment regimen without any adverse effects or reactions reported.  Tamika is seen today by the attending psychiatrist for discharge evaluation. He says he is doing well. He denies any substance withdrawal symptoms. He is looking forward to working on his addiction. Not expressing any delusions today. There are no hallucinations. Feels in control of himself. There are no fantasy about suicide lately. No suicidal thoughts. He is looking forward to going back to school. No thoughts of violence. No craving for drugs.  Does not feel depressed. No evidence of mania.  The nursing staff reports that Robson has been appropriate on the unit. He has been interacting well with peers. No behavioral issues. He has not voiced any suicidal thoughts. Darrin has not been observed to be internally stimulated or preoccupied. He has been adherent to his treatment recommendations. He has been tolerating his medication well. No reported adverse effects or reactions.   Thadius was discussed at the treatment team meeting this morning. Team members feel that he is back to his baseline level of function. The team agrees with plan to discharge patient today to continue mental health care on an outpatient basis as noted below. He was provided with all the necessary information needed to make this appointment without problems. He left [facility] with all personal belongings in no apparent distress.    Physical Findings: AIMS: Facial and Oral Movements Muscles of Facial Expression: None,  normal Lips and Perioral Area: None, normal Jaw: None, normal Tongue: None, normal,Extremity Movements Upper (arms, wrists, hands, fingers): None, normal Lower (legs, knees, ankles, toes): None, normal, Trunk Movements Neck, shoulders, hips: None, normal, Overall Severity Severity of abnormal movements (highest score from questions above): None, normal Incapacitation due to abnormal movements: None, normal Patient's awareness of abnormal movements (rate only patient's report): No Awareness, Dental Status Current problems with teeth and/or dentures?: No Does patient usually wear dentures?: No  CIWA:    COWS:     Musculoskeletal: Strength & Muscle Tone: within normal limits Gait & Station: normal Patient leans: N/A  Psychiatric Specialty Exam: Physical Exam  Constitutional: He appears well-developed and well-nourished.  HENT:  Head: Normocephalic.  Eyes: Pupils are equal, round, and reactive to light.  Neck: Normal range of motion.  Cardiovascular: Normal rate.  Respiratory: Effort normal.  GI: Soft.  Genitourinary:  Genitourinary Comments: Deferred  Musculoskeletal: Normal range of motion.  Neurological: He is alert.  Skin: Skin is warm.    Review of Systems  Constitutional: Negative.   HENT: Negative.   Eyes: Negative.   Respiratory: Negative.   Cardiovascular: Negative.   Gastrointestinal: Negative.   Genitourinary: Negative.   Musculoskeletal: Negative.   Skin: Negative.   Neurological: Negative.   Endo/Heme/Allergies: Negative.   Psychiatric/Behavioral: Positive for depression (Stable), hallucinations (Hx. auditory hallucinations) and substance abuse (Hx. Polysubstance use disorder). Negative for memory loss and suicidal ideas. The patient has insomnia (Stable). The patient is not nervous/anxious.     Blood pressure (!) 192/98, pulse 68, temperature 97.9 F (36.6 C), temperature source Oral, resp. rate 20, height 6\' 3"  (1.905 m), weight 105.2 kg.Body mass index  is 29 kg/m.  See Md's SRA   Have you used any form of tobacco in the last 30 days? (Cigarettes, Smokeless Tobacco, Cigars, and/or Pipes): Yes  Has this patient used any form of tobacco in the last 30 days? (Cigarettes, Smokeless Tobacco, Cigars, and/or Pipes): Yes, provided with a nicotine patch prescription for smoking cessation.  Blood Alcohol level:  Lab Results  Component Value Date   ETH <10 11/02/2017   ETH <10 11/01/2017   Metabolic Disorder Labs:  Lab Results  Component Value Date   HGBA1C 6.0 (H) 11/04/2017   MPG 125.5 11/04/2017   MPG 114 05/27/2016   Lab Results  Component Value Date   PROLACTIN 30.3 (H) 05/27/2016   Lab Results  Component Value Date   CHOL 143 11/04/2017   TRIG 95 11/04/2017   HDL 35 (L) 11/04/2017   CHOLHDL 4.1 11/04/2017   VLDL  19 11/04/2017   LDLCALC 89 11/04/2017   LDLCALC 78 05/27/2016   See Psychiatric Specialty Exam and Suicide Risk Assessment completed by Attending Physician prior to discharge.  Discharge destination:  [facility]  Is patient on multiple antipsychotic therapies at discharge:  No   Has Patient had three or more failed trials of antipsychotic monotherapy by history:  No  Recommended Plan for Multiple Antipsychotic Therapies: NA  Allergies as of 11/05/2017      Reactions   Mold Extract [trichophyton] Shortness Of Breath      Medication List    STOP taking these medications   benztropine 0.5 MG tablet Commonly known as:  COGENTIN   gabapentin 400 MG capsule Commonly known as:  NEURONTIN   lisinopril 20 MG tablet Commonly known as:  PRINIVIL,ZESTRIL     TAKE these medications     Indication  albuterol 108 (90 Base) MCG/ACT inhaler Commonly known as:  PROVENTIL HFA;VENTOLIN HFA Inhale 1-2 puffs into the lungs every 4 (four) hours as needed for wheezing or shortness of breath.  Indication:  Asthma   amLODipine 10 MG tablet Commonly known as:  NORVASC Take 1 tablet (10 mg total) by mouth daily. For high blood  pressure Start taking on:  11/06/2017 What changed:    medication strength  how much to take  Indication:  High Blood Pressure Disorder   busPIRone 5 MG tablet Commonly known as:  BUSPAR Take 1 tablet (5 mg total) by mouth 2 (two) times daily. For anxiety  Indication:  Anxiety Disorder   hydrOXYzine 25 MG tablet Commonly known as:  ATARAX/VISTARIL Take 1 tablet (25 mg total) by mouth 3 (three) times daily as needed for anxiety. What changed:    medication strength  how much to take  how to take this  when to take this  reasons to take this  additional instructions  Indication:  Feeling Anxious   nicotine 21 mg/24hr patch Commonly known as:  NICODERM CQ - dosed in mg/24 hours Place 1 patch (21 mg total) onto the skin daily. (May purchase from over the counter): For smoking cessation Start taking on:  11/06/2017 What changed:  additional instructions  Indication:  Nicotine Addiction   OLANZapine 2.5 MG tablet Commonly known as:  ZYPREXA Take 1 tablet (2.5 mg total) by mouth at bedtime. For mood control What changed:    medication strength  how much to take  Indication:  Mood control   sertraline 50 MG tablet Commonly known as:  ZOLOFT Take 1 tablet (50 mg total) by mouth daily. For depression Start taking on:  11/06/2017 What changed:    medication strength  how much to take  Indication:  Major Depressive Disorder   tamsulosin 0.4 MG Caps capsule Commonly known as:  FLOMAX Take 1 capsule (0.4 mg total) by mouth 2 (two) times daily. For enlarged prostate What changed:    when to take this  additional instructions  Indication:  Benign Enlargement of Prostate   traZODone 50 MG tablet Commonly known as:  DESYREL Take 1 tablet (50 mg total) by mouth at bedtime as needed for sleep. What changed:    how much to take  how to take this  when to take this  reasons to take this  additional instructions  Indication:  Trouble Sleeping       Follow-up Information    [facility]. Go on 11/15/2017.   Why:  Appointment for medication management services is Monday, 11/15/17 at 2:00pm. Please be sure to bring  your Photo ID, any insurance information and any discharge paperwork from this hospitalization.  Contact information: [address] [place], [place], [address]  Phone: [phone] Fax: [phone]       [facility] Go to.   Specialty:  Professional Counselor Why:  For therapy servcies, walk in hours are Monday- Friday from 8:30am-12:00pm - 1:00pm-2:30pm. Please be sure to bring your Photo ID, SSN, any insurance information, and any discharge paperwork from this hospitalization.  Contact information: [facility] [address] [place] [address] [phone]          Follow-up recommendations: Activity:  As tolerated Diet: As recommended by your primary care doctor. Keep all scheduled follow-up appointments as recommended.  Comments: Patient is instructed prior to discharge to: Take all medications as prescribed by his/her mental healthcare provider. Report any adverse effects and or reactions from the medicines to his/her outpatient provider promptly. Patient has been instructed & cautioned: To not engage in alcohol and or illegal drug use while on prescription medicines. In the event of worsening symptoms, patient is instructed to call the crisis hotline, 911 and or go to the nearest ED for appropriate evaluation and treatment of symptoms. To follow-up with his/her primary care provider for your other medical issues, concerns and or health care needs.   Signed: Armandina Stammer, NP, PMHNP, FNP-BC 11/05/2017, 1:52 PM

## 2017-11-05 NOTE — Progress Notes (Signed)
  [facility] Adult Case Management Discharge Plan :  Will you be returning to the same living situation after discharge:  Yes,  patient reports he is returning to his home alone At discharge, do you have transportation home?: Yes,  patient reports his girlfriend will pick him up at discharge Do you have the ability to pay for your medications: Yes,  UHC Medicare, SSDI, part time employment  Release of information consent forms completed and in the chart;  Patient's signature needed at discharge.  Patient to Follow up at: Follow-up Information    [facility]. Go on 11/15/2017.   Why:  Appointment for medication management services is Monday, 11/15/17 at 2:00pm. Please be sure to bring your Photo ID, any insurance information and any discharge paperwork from this hospitalization.  Contact information: [address] [place], [place], [address]  Phone: [phone] Fax: [phone]       [facility] Go to.   Specialty:  Professional Counselor Why:  For therapy servcies, walk in hours are Monday- Friday from 8:30am-12:00pm - 1:00pm-2:30pm. Please be sure to bring your Photo ID, SSN, any insurance information, and any discharge paperwork from this hospitalization.  Contact information: [facility] [address] [place] [address] [phone]           Next level of care provider has access to [facility] Link:yes  Safety Planning and Suicide Prevention discussed: Yes,  with the patient's girlfriend  Have you used any form of tobacco in the last 30 days? (Cigarettes, Smokeless Tobacco, Cigars, and/or Pipes): Yes  Has patient been referred to the Quitline?: Patient refused referral  Patient has been referred for addiction treatment: Pt. refused referral  [name], LCSWA 11/05/2017, 12:09 PM

## 2017-11-05 NOTE — Tx Team (Signed)
Interdisciplinary Treatment and Diagnostic Plan Update  11/05/2017 Time of Session:  Timothy Mcmillan MRN: [id number]  Principal Diagnosis: <principal problem not specified>  Secondary Diagnoses: Active Problems:   Schizoaffective disorder, bipolar type (HCC)   Current Medications:  Current Facility-Administered Medications  Medication Dose Route Frequency Provider Last Rate Last Dose  . amLODipine (NORVASC) tablet 10 mg  10 mg Oral Daily Nira Conn A, NP   10 mg at 11/05/17 0815  . busPIRone (BUSPAR) tablet 5 mg  5 mg Oral BID Antonieta Pert, MD   5 mg at 11/05/17 0815  . hydrOXYzine (ATARAX/VISTARIL) tablet 25 mg  25 mg Oral TID PRN Jackelyn Poling, NP   25 mg at 11/04/17 0858  . Influenza vac split quadrivalent PF (FLUARIX) injection 0.5 mL  0.5 mL Intramuscular Tomorrow-1000 Jola Babinski, Marlane Mingle, MD      . nicotine (NICODERM CQ - dosed in mg/24 hours) patch 21 mg  21 mg Transdermal Daily Antonieta Pert, MD   21 mg at 11/05/17 0814  . OLANZapine (ZYPREXA) tablet 2.5 mg  2.5 mg Oral QHS Antonieta Pert, MD   2.5 mg at 11/04/17 2218  . sertraline (ZOLOFT) tablet 50 mg  50 mg Oral Daily Antonieta Pert, MD   50 mg at 11/05/17 0815  . tamsulosin (FLOMAX) capsule 0.4 mg  0.4 mg Oral BID Antonieta Pert, MD   0.4 mg at 11/05/17 0815  . traZODone (DESYREL) tablet 50 mg  50 mg Oral QHS,MR X 1 Nira Conn A, NP       PTA Medications: Medications Prior to Admission  Medication Sig Dispense Refill Last Dose  . albuterol (PROAIR HFA) 108 (90 Base) MCG/ACT inhaler Inhale 1-2 puffs into the lungs every 4 (four) hours as needed for wheezing or shortness of breath.   unk  . amLODipine (NORVASC) 5 MG tablet Take 1 tablet (5 mg total) by mouth daily. For high blood pressure (Patient taking differently: Take 10 mg by mouth daily. For high blood pressure) 30 tablet 0 unk  . benztropine (COGENTIN) 0.5 MG tablet Take 1 tablet (0.5 mg total) by mouth at bedtime as needed for tremors  (for eps , if needed with zyprexa). 30 tablet 0 unk  . gabapentin (NEURONTIN) 400 MG capsule Take 1 capsule (400 mg total) by mouth 3 (three) times daily. For agitation 90 capsule 0 Taking  . hydrOXYzine (ATARAX/VISTARIL) 50 MG tablet Take 1 tablet four times daily as needed: For anxiety 60 tablet 0 unk  . lisinopril (PRINIVIL,ZESTRIL) 20 MG tablet Take 1 tablet (20 mg total) by mouth daily. For high blood pressure 30 tablet 0 unk  . nicotine (NICODERM CQ - DOSED IN MG/24 HOURS) 21 mg/24hr patch Place 1 patch (21 mg total) onto the skin daily. For smoking cessation 28 patch 0 unk  . OLANZapine (ZYPREXA) 5 MG tablet Take 1 tablet (5 mg total) by mouth at bedtime. For mood control 30 tablet 0 unk  . sertraline (ZOLOFT) 100 MG tablet Take 1 tablet (100 mg total) by mouth daily. For depression 30 tablet 0 unk  . tamsulosin (FLOMAX) 0.4 MG CAPS capsule Take 1 capsule (0.4 mg total) by mouth 2 (two) times daily. For prostate health 30 capsule 0 unk  . traZODone (DESYREL) 50 MG tablet Take 1 tablet (50 mg) at bedtime: For sleep 30 tablet 0 unk    Patient Stressors: Medication change or noncompliance Substance abuse  Patient Strengths: Ability for insight Average or above average intelligence General fund  of knowledge  Treatment Modalities: Medication Management, Group therapy, Case management,  1 to 1 session with clinician, Psychoeducation, Recreational therapy.   Physician Treatment Plan for Primary Diagnosis: <principal problem not specified> Long Term Goal(s): Improvement in symptoms so as ready for discharge Improvement in symptoms so as ready for discharge   Short Term Goals: Ability to identify changes in lifestyle to reduce recurrence of condition will improve Ability to verbalize feelings will improve Ability to disclose and discuss suicidal ideas Ability to identify and develop effective coping behaviors will improve Compliance with prescribed medications will improve Ability to  identify triggers associated with substance abuse/mental health issues will improve  Medication Management: Evaluate patient's response, side effects, and tolerance of medication regimen.  Therapeutic Interventions: 1 to 1 sessions, Unit Group sessions and Medication administration.  Evaluation of Outcomes: Progressing  Physician Treatment Plan for Secondary Diagnosis: Active Problems:   Schizoaffective disorder, bipolar type (HCC)  Long Term Goal(s): Improvement in symptoms so as ready for discharge Improvement in symptoms so as ready for discharge   Short Term Goals: Ability to identify changes in lifestyle to reduce recurrence of condition will improve Ability to verbalize feelings will improve Ability to disclose and discuss suicidal ideas Ability to identify and develop effective coping behaviors will improve Compliance with prescribed medications will improve Ability to identify triggers associated with substance abuse/mental health issues will improve     Medication Management: Evaluate patient's response, side effects, and tolerance of medication regimen.  Therapeutic Interventions: 1 to 1 sessions, Unit Group sessions and Medication administration.  Evaluation of Outcomes: Progressing   RN Treatment Plan for Primary Diagnosis: <principal problem not specified> Long Term Goal(s): Knowledge of disease and therapeutic regimen to maintain health will improve  Short Term Goals: Ability to demonstrate self-control, Ability to participate in decision making will improve, Ability to identify and develop effective coping behaviors will improve and Compliance with prescribed medications will improve  Medication Management: RN will administer medications as ordered by provider, will assess and evaluate patient's response and provide education to patient for prescribed medication. RN will report any adverse and/or side effects to prescribing provider.  Therapeutic Interventions: 1 on  1 counseling sessions, Psychoeducation, Medication administration, Evaluate responses to treatment, Monitor vital signs and CBGs as ordered, Perform/monitor CIWA, COWS, AIMS and Fall Risk screenings as ordered, Perform wound care treatments as ordered.  Evaluation of Outcomes: Progressing   LCSW Treatment Plan for Primary Diagnosis: <principal problem not specified> Long Term Goal(s): Safe transition to appropriate next level of care at discharge, Engage patient in therapeutic group addressing interpersonal concerns.  Short Term Goals: Engage patient in aftercare planning with referrals and resources  Therapeutic Interventions: Assess for all discharge needs, 1 to 1 time with Social worker, Explore available resources and support systems, Assess for adequacy in community support network, Educate family and significant other(s) on suicide prevention, Complete Psychosocial Assessment, Interpersonal group therapy.  Evaluation of Outcomes: Adequate for Discharge   Progress in Treatment: Attending groups: No. Participating in groups: No. Taking medication as prescribed: Yes. Toleration medication: Yes. Family/Significant other contact made: Yes, individual(s) contacted:  the patient's girlfriend Patient understands diagnosis: Yes. Discussing patient identified problems/goals with staff: Yes. Medical problems stabilized or resolved: Yes. Denies suicidal/homicidal ideation: Yes. Issues/concerns per patient self-inventory: No. Other:   New problem(s) identified: None   New Short Term/Long Term Goal(s):Detox, medication stabilization, elimination of SI thoughts, development of comprehensive mental wellness plan.    Patient Goals:  Need to get a handle  on my depression and anxiety  Discharge Plan or Barriers: Patient plans to discharge home with his girlfriend and follow up with [facility] for medication management and [facility] for therapy services.   Reason for  Continuation of Hospitalization: Anxiety Depression Medication stabilization  Estimated Length of Stay: 3-5 days  Attendees: Patient: 11/05/2017 8:33 AM  Physician: Dr. Landry Mellow, MD 11/05/2017 8:33 AM  Nursing: Casimiro Needle RN 11/05/2017 8:33 AM  RN Care Manager: Juliann Pares 11/05/2017 8:33 AM  Social Worker: Baldo Daub, Theresia Majors 11/05/2017 8:33 AM  Recreational Therapist: Juliann Pares 11/05/2017 8:33 AM  Other: Reola Calkins, NP 11/05/2017 8:33 AM  Other:  11/05/2017 8:33 AM  Other: 11/05/2017 8:33 AM    Scribe for Treatment Team: [name], LCSWA 11/05/2017 8:33 AM

## 2018-04-15 ENCOUNTER — Emergency Department (HOSPITAL_COMMUNITY)
Admission: EM | Admit: 2018-04-15 | Discharge: 2018-04-17 | Disposition: A | Discharge status: Home / Self Care | Payer: Medicare Other | Attending: Emergency Medicine | Admitting: Emergency Medicine

## 2018-04-15 ENCOUNTER — Encounter (HOSPITAL_COMMUNITY): Payer: Self-pay

## 2018-04-15 ENCOUNTER — Other Ambulatory Visit: Payer: Self-pay

## 2018-04-15 DIAGNOSIS — F1420 Cocaine dependence, uncomplicated: Secondary | ICD-10-CM | POA: Diagnosis not present

## 2018-04-15 DIAGNOSIS — F332 Major depressive disorder, recurrent severe without psychotic features: Secondary | ICD-10-CM | POA: Insufficient documentation

## 2018-04-15 DIAGNOSIS — F1910 Other psychoactive substance abuse, uncomplicated: Secondary | ICD-10-CM

## 2018-04-15 DIAGNOSIS — Z79899 Other long term (current) drug therapy: Secondary | ICD-10-CM | POA: Insufficient documentation

## 2018-04-15 DIAGNOSIS — F17210 Nicotine dependence, cigarettes, uncomplicated: Secondary | ICD-10-CM | POA: Diagnosis not present

## 2018-04-15 DIAGNOSIS — F1020 Alcohol dependence, uncomplicated: Secondary | ICD-10-CM | POA: Insufficient documentation

## 2018-04-15 DIAGNOSIS — I10 Essential (primary) hypertension: Secondary | ICD-10-CM | POA: Insufficient documentation

## 2018-04-15 DIAGNOSIS — R45851 Suicidal ideations: Secondary | ICD-10-CM

## 2018-04-15 LAB — CBC WITH DIFFERENTIAL/PLATELET
ABS IMMATURE GRANULOCYTES: 0.06 10*3/uL (ref 0.00–0.07)
Basophils Absolute: 0 10*3/uL (ref 0.0–0.1)
Basophils Relative: 0 %
EOS ABS: 0 10*3/uL (ref 0.0–0.5)
Eosinophils Relative: 0 %
HCT: 45.5 % (ref 39.0–52.0)
Hemoglobin: 15.1 g/dL (ref 13.0–17.0)
IMMATURE GRANULOCYTES: 1 %
Lymphocytes Relative: 14 %
Lymphs Abs: 1.5 10*3/uL (ref 0.7–4.0)
MCH: 31.6 pg (ref 26.0–34.0)
MCHC: 33.2 g/dL (ref 30.0–36.0)
MCV: 95.2 fL (ref 80.0–100.0)
Monocytes Absolute: 0.7 10*3/uL (ref 0.1–1.0)
Monocytes Relative: 6 %
NEUTROS ABS: 8.8 10*3/uL — AB (ref 1.7–7.7)
NEUTROS PCT: 79 %
Platelets: 256 10*3/uL (ref 150–400)
RBC: 4.78 MIL/uL (ref 4.22–5.81)
RDW: 13.1 % (ref 11.5–15.5)
WBC: 11.1 10*3/uL — AB (ref 4.0–10.5)
nRBC: 0 % (ref 0.0–0.2)

## 2018-04-15 LAB — MAGNESIUM: MAGNESIUM: 2.2 mg/dL (ref 1.7–2.4)

## 2018-04-15 LAB — ETHANOL: Alcohol, Ethyl (B): <10 mg/dL (ref <10)

## 2018-04-15 MED ORDER — MAGNESIUM SULFATE 2 GM/50ML IV SOLN
2.0000 g | Freq: Once | INTRAVENOUS | Status: AC
  Administered 2018-04-15: 2 g via INTRAVENOUS
  Filled 2018-04-15: qty 50

## 2018-04-15 MED ORDER — POTASSIUM CHLORIDE 10 MEQ/100ML IV SOLN
10.0000 meq | Freq: Once | INTRAVENOUS | Status: AC
  Administered 2018-04-16: 10 meq via INTRAVENOUS
  Filled 2018-04-15: qty 100

## 2018-04-15 MED ORDER — POTASSIUM CHLORIDE 10 MEQ/100ML IV SOLN
10.0000 meq | Freq: Once | INTRAVENOUS | Status: AC
  Administered 2018-04-15: 10 meq via INTRAVENOUS
  Filled 2018-04-15: qty 100

## 2018-04-15 MED ORDER — SODIUM CHLORIDE 0.9 % IV BOLUS
1000.0000 mL | Freq: Once | INTRAVENOUS | Status: AC
  Administered 2018-04-16: 1000 mL via INTRAVENOUS

## 2018-04-15 MED ORDER — POTASSIUM CHLORIDE CRYS ER 20 MEQ PO TBCR
40.0000 meq | EXTENDED_RELEASE_TABLET | Freq: Once | ORAL | Status: AC
  Administered 2018-04-16: 40 meq via ORAL
  Filled 2018-04-15: qty 2

## 2018-04-15 NOTE — ED Provider Notes (Signed)
2255: Patient handed off to me by EDMD Dr. Madilyn Hook at shift change pending labs, TTS consult.  Please see previous note for full details.  Briefly, patient presents for suicidal ideations and occasional hallucinations but without specific plan.  Has been noncompliant with psych meds for several weeks.  Endorses EtOH and crack cocaine use recently.  Currently homeless. Physical Exam  BP 102/66   Pulse 63   Temp 98.2 F (36.8 C) (Oral)   Resp 18   SpO2 96%   Physical Exam Vitals signs and nursing note reviewed.  Constitutional:      Appearance: He is well-developed.     Comments: Non toxic.  HENT:     Head: Normocephalic and atraumatic.     Nose: Nose normal.  Eyes:     Conjunctiva/sclera: Conjunctivae normal.     Pupils: Pupils are equal, round, and reactive to light.  Neck:     Musculoskeletal: Normal range of motion.  Cardiovascular:     Rate and Rhythm: Normal rate and regular rhythm.     Heart sounds: Normal heart sounds.  Pulmonary:     Effort: Pulmonary effort is normal.     Breath sounds: Normal breath sounds.  Abdominal:     General: Bowel sounds are normal.     Palpations: Abdomen is soft.     Tenderness: There is no abdominal tenderness.  Musculoskeletal: Normal range of motion.  Skin:    General: Skin is warm and dry.     Capillary Refill: Capillary refill takes less than 2 seconds.  Neurological:     Mental Status: He is alert and oriented to person, place, and time.  Psychiatric:        Mood and Affect: Affect is inappropriate.        Behavior: Behavior is uncooperative.        Thought Content: Thought content includes suicidal ideation.     Comments: Snoring loudly.  Wakes up to loud voice.  Frequently falls asleep during conversation and questioning.  Needs to be frequently woken up.  Answers in 2-3 word sentences without opening his eyes.  Endorses suicidal thoughts but "not as bad as before".  Multiple times denies HI.     ED Course/Procedures   Clinical  Course as of Apr 14 2300  Fri Apr 15, 2018  2248 RN notified me pt is unarousable.  He keeps falling asleep during conversation.  I evaluated pt.  He wakes up with loud voice.  He can answer yes/no to brief questions, frequently falls asleep during questions and needs to be aroused.  He states he is having SI but "not as bad as before". Denies HI.  I do not think he will stay awake for TTS consult.  Will recheck CBG, ammonia, finish IVF.  Will likely need reassessment in the AM.     [CG]    Clinical Course User Index [CG] Liberty Handy, PA-C    Procedures  MDM   2255: K2.7.  Mag normal.  Creatinine 1.45.  EKG without hypokalemic changes.  Patient was given IV potassium and magnesium.  1 L IV fluids running.  I evaluated patient again because RN notified me he was not awake enough to have TTS tele interview.  He is arousable but frequently falling asleep during conversation.  "i'm just tired".  He denies any pain.  I was able to stand patient up to help provide a urine sample in urinal, but ultimately he laid back down in bed and could not  provide UA.  Given recent EtOH and crack cocaine use, he may be coming off his intoxication.  Ammonia, ASA, APAP and CBG ordered.  I do not see indicated for further imaging/labs right now.  Patient will need to be monitored overnight and reevaluated in the morning prior to TTS evaluation.  If remaining labs unremarkable, medically cleared.     Liberty Handy, PA-C 04/15/18 2302    Loren Racer, MD 04/22/18 [phone]

## 2018-04-15 NOTE — ED Notes (Signed)
TTS called, pt is unable to be assessed at this time, is only responsive to painful stimuli at this time.

## 2018-04-15 NOTE — ED Provider Notes (Addendum)
[facility] EMERGENCY DEPARTMENT Provider Note   CSN: [id number] Arrival date & time: 04/15/18  1510    History   Chief Complaint Chief Complaint  Patient presents with  . Medication Refill    HPI Timothy Mcmillan is a 63 y.o. male.     The history is provided by the patient and medical records. No language interpreter was used.  Medication Refill   Timothy Mcmillan is a 63 y.o. male who presents to the Emergency Department complaining of SI. He presents to the emergency department complaining of suicidal thoughts. He states he has ongoing suicidal thoughts and occasional hallucinations but does not currently have a plan. He states that he feels more suicidal when he goes outside. He states that a friend brought him to the emergency department. He is here voluntarily. He does have a history of alcohol and drug abuse. He drank alcohol today, last drug use was crack cocaine yesterday. He denies any fevers, chest pain, shortness of breath, nausea, vomiting. He states that he has been out of his psychiatric medications for one or two months. He is currently homeless. Past Medical History:  Diagnosis Date  . Depression   . ETOH abuse   . Hypertension   . RSD (reflex sympathetic dystrophy)     Patient Active Problem List   Diagnosis Date Noted  . Schizoaffective disorder, bipolar type (HCC) 11/03/2017  . Cocaine use disorder, severe, dependence (HCC) 05/25/2016  . Alcohol use disorder, moderate, dependence (HCC) 05/25/2016  . Essential hypertension 05/25/2016  . MDD (major depressive disorder), recurrent severe, without psychosis (HCC) 05/21/2016  . Severe episode of recurrent major depressive disorder, with psychotic features (HCC) 05/16/2016  . Abnormal EKG 03/18/2016  . Memory changes 03/18/2016  . Mild intermittent asthma without complication 03/18/2016  . Pterygium of both eyes 03/18/2016  . Retrograde ejaculation 03/18/2016  . Benign prostatic  hyperplasia 03/13/2016  . Bipolar 1 disorder, depressed (HCC) 03/13/2016  . Complex regional pain syndrome type 1 of left lower extremity 03/13/2016  . Neck pain 03/13/2016  . Alcohol use disorder, severe, dependence (HCC) 02/16/2016  . Substance induced mood disorder (HCC) 10/07/2015  . Cocaine abuse (HCC)   . Alcohol abuse   . Acute renal failure (HCC)   . Hypokalemia 11/14/2014  . Polysubstance abuse (HCC) 11/14/2014  . Alcohol withdrawal delirium (HCC) 11/14/2014  . CAP (community acquired pneumonia) 11/14/2014  . Alcohol withdrawal (HCC) 11/14/2014  . Acute renal failure (ARF) (HCC) 11/14/2014  . Acute encephalopathy   . Reflex sympathetic dystrophy 05/02/2010  . Hypertension, benign 03/21/2002    Past Surgical History:  Procedure Laterality Date  . EYE SURGERY    . HERNIA REPAIR    . surgery to lower L leg          Home Medications    Prior to Admission medications   Medication Sig Start Date End Date Taking? Authorizing Provider  albuterol (PROAIR HFA) 108 (90 Base) MCG/ACT inhaler Inhale 1-2 puffs into the lungs every 4 (four) hours as needed for wheezing or shortness of breath. 11/05/17   Armandina Stammer I, NP  amLODipine (NORVASC) 10 MG tablet Take 1 tablet (10 mg total) by mouth daily. For high blood pressure 11/06/17   Nwoko, Nicole Kindred I, NP  busPIRone (BUSPAR) 5 MG tablet Take 1 tablet (5 mg total) by mouth 2 (two) times daily. For anxiety 11/05/17   Armandina Stammer I, NP  hydrOXYzine (ATARAX/VISTARIL) 25 MG tablet Take 1 tablet (25 mg total) by mouth 3 (  three) times daily as needed for anxiety. 11/05/17   Armandina Stammer I, NP  nicotine (NICODERM CQ - DOSED IN MG/24 HOURS) 21 mg/24hr patch Place 1 patch (21 mg total) onto the skin daily. (May purchase from over the counter): For smoking cessation 11/06/17   Armandina Stammer I, NP  OLANZapine (ZYPREXA) 2.5 MG tablet Take 1 tablet (2.5 mg total) by mouth at bedtime. For mood control 11/05/17   Armandina Stammer I, NP  sertraline (ZOLOFT) 50  MG tablet Take 1 tablet (50 mg total) by mouth daily. For depression 11/06/17   Armandina Stammer I, NP  tamsulosin (FLOMAX) 0.4 MG CAPS capsule Take 1 capsule (0.4 mg total) by mouth 2 (two) times daily. For enlarged prostate 11/05/17   Armandina Stammer I, NP  traZODone (DESYREL) 50 MG tablet Take 1 tablet (50 mg total) by mouth at bedtime as needed for sleep. 11/05/17   Sanjuana Kava, NP    Family History Family History  Problem Relation Age of Onset  . Diabetes type II Father   . Stroke Father     Social History Social History   Tobacco Use  . Smoking status: Current Every Day Smoker    Packs/day: 0.50    Types: Cigarettes  . Smokeless tobacco: Never Used  . Tobacco comment: declined  Substance Use Topics  . Alcohol use: Yes    Comment: intermittently  . Drug use: Yes    Types: Cocaine, "Crack" cocaine     Allergies   Mold extract [trichophyton]   Review of Systems Review of Systems  All other systems reviewed and are negative.    Physical Exam Updated Vital Signs BP 130/82 (BP Location: Right Arm)   Pulse 96   Temp 98.2 F (36.8 C) (Oral)   Resp 18   SpO2 98%   Physical Exam Vitals signs and nursing note reviewed.  Constitutional:      Appearance: He is well-developed.  HENT:     Head: Normocephalic and atraumatic.  Cardiovascular:     Rate and Rhythm: Normal rate and regular rhythm.  Pulmonary:     Effort: Pulmonary effort is normal. No respiratory distress.  Musculoskeletal: Normal range of motion.  Skin:    General: Skin is warm.  Neurological:     Mental Status: He is alert and oriented to person, place, and time.     Comments: Moves all extremities symmetrically.  Psychiatric:     Comments: Appears intoxicated. Depressed mood.      ED Treatments / Results  Labs (all labs ordered are listed, but only abnormal results are displayed) Labs Reviewed  COMPREHENSIVE METABOLIC PANEL - Abnormal; Notable for the following components:      Result Value    Potassium 2.7 (*)    Glucose, Bld 172 (*)    Creatinine, Ser 1.45 (*)    Calcium 8.7 (*)    GFR calc non Af Amer 51 (*)    GFR calc Af Amer 59 (*)    All other components within normal limits  CBC WITH DIFFERENTIAL/PLATELET - Abnormal; Notable for the following components:   WBC 11.1 (*)    Neutro Abs 8.8 (*)    All other components within normal limits  ETHANOL  RAPID URINE DRUG SCREEN, HOSP PERFORMED    EKG None  Radiology No results found.  Procedures Procedures (including critical care time)  Medications Ordered in ED Medications - No data to display   Initial Impression / Assessment and Plan / ED Course  I  have reviewed the triage vital signs and the nursing notes.  Pertinent labs & imaging results that were available during my care of the patient were reviewed by me and considered in my medical decision making (see chart for details).  Clinical Course as of Apr 16 2211  Fri Apr 15, 2018  2248 RN notified me pt is unarousable.  He keeps falling asleep during conversation.  I evaluated pt.  He wakes up with loud voice.  He can answer yes/no to brief questions, frequently falls asleep during questions and needs to be aroused.  He states he is having SI but "not as bad as before". Denies HI.  I do not think he will stay awake for TTS consult.  Will recheck CBG, ammonia, finish IVF.  Will likely need reassessment in the AM.     [CG]    Clinical Course User Index [CG] Liberty Handy, PA-C       Pt with hx/o schizophrenia, polysubstance abuse here for medication refill, SI and hallucinations.  Pt is calm on evaluation but appears intoxicated.  Reports recent EtOH and crack use.  Pt care transferred pending labs, psych eval.    Final Clinical Impressions(s) / ED Diagnoses   Final diagnoses:  None    ED Discharge Orders    None       Tilden Fossa, MD 04/15/18 Darnell Level    Tilden Fossa, MD 04/16/18 2213

## 2018-04-15 NOTE — ED Notes (Signed)
Provider and RN in room, very difficult to keep pt awake, pt mumbling and unable to answer any questions.  Attempted to get urine from pt, pt was stood, however had to be held to keep on feet.  Pt falling asleep on feet.  Unable to provider urine.  Pt will be reassessed by TTS when pt is alert and oriented.  RN will continue to monitor.

## 2018-04-15 NOTE — BH Assessment (Signed)
Clinician contacted pt's nurse in an attempt to complete BH Assessment. Victorino Dike, RN, states pt is "out" and has been unresponsive to getting an IV. She states they have also attempted to get urine from him but that have been unable to wake him. She recommends calling back at a later time.

## 2018-04-15 NOTE — BH Assessment (Signed)
Clinician contacted unit in an effort to complete BH Assessment; a trauma just came in and the hospital staff are focusing on that at this time. Hiraa, Licensed conveyancer, requested TTS call back at a later time.

## 2018-04-15 NOTE — ED Notes (Signed)
Pt in hallway bed. Pt reports thoughts of suicide with a plan of "driving his car into traffic" Pt endorses cocaine and crack use. Last time used today @ 1600. Pt reports he has not taken his medication for bi-polar in a while. Pt corporate and resting in bed at this time.

## 2018-04-15 NOTE — ED Triage Notes (Signed)
Pt states hx of depression, schizophrenia and bipolar. States he is out of medications.

## 2018-04-15 NOTE — ED Notes (Signed)
Pt ate bagel brought by friend. This tech gave pt water.

## 2018-04-16 DIAGNOSIS — F332 Major depressive disorder, recurrent severe without psychotic features: Secondary | ICD-10-CM | POA: Diagnosis not present

## 2018-04-16 LAB — COMPREHENSIVE METABOLIC PANEL
ALT: 13 U/L (ref 0–44)
AST: 20 U/L (ref 15–41)
Albumin: 3.7 g/dL (ref 3.5–5.0)
Alkaline Phosphatase: 60 U/L (ref 38–126)
Anion gap: 10 (ref 5–15)
BUN: 9 mg/dL (ref 8–23)
CHLORIDE: 107 mmol/L (ref 98–111)
CO2: 23 mmol/L (ref 22–32)
Calcium: 8.7 mg/dL — ABNORMAL LOW (ref 8.9–10.3)
Creatinine, Ser: 1.45 mg/dL — ABNORMAL HIGH (ref 0.61–1.24)
GFR, EST AFRICAN AMERICAN: 59 mL/min — AB (ref >60)
GFR, EST NON AFRICAN AMERICAN: 51 mL/min — AB (ref >60)
Glucose, Bld: 172 mg/dL — ABNORMAL HIGH (ref 70–99)
POTASSIUM: 2.7 mmol/L — AB (ref 3.5–5.1)
Sodium: 140 mmol/L (ref 135–145)
TOTAL PROTEIN: 6.5 g/dL (ref 6.5–8.1)
Total Bilirubin: 0.6 mg/dL (ref 0.3–1.2)

## 2018-04-16 LAB — BASIC METABOLIC PANEL
Anion gap: 4 — ABNORMAL LOW (ref 5–15)
BUN: 8 mg/dL (ref 8–23)
CO2: 24 mmol/L (ref 22–32)
Calcium: 8.2 mg/dL — ABNORMAL LOW (ref 8.9–10.3)
Chloride: 113 mmol/L — ABNORMAL HIGH (ref 98–111)
Creatinine, Ser: 1.16 mg/dL (ref 0.61–1.24)
GFR calc Af Amer: >60 mL/min (ref >60)
GLUCOSE: 115 mg/dL — AB (ref 70–99)
POTASSIUM: 3.5 mmol/L (ref 3.5–5.1)
Sodium: 141 mmol/L (ref 135–145)

## 2018-04-16 LAB — AMMONIA: Ammonia: 28 umol/L (ref 9–35)

## 2018-04-16 LAB — ACETAMINOPHEN LEVEL: Acetaminophen (Tylenol), Serum: <10 ug/mL — ABNORMAL LOW (ref 10–30)

## 2018-04-16 LAB — RAPID URINE DRUG SCREEN, HOSP PERFORMED
AMPHETAMINES: NOT DETECTED
BARBITURATES: NOT DETECTED
Benzodiazepines: NOT DETECTED
Cocaine: POSITIVE — AB
Opiates: POSITIVE — AB
Tetrahydrocannabinol: NOT DETECTED

## 2018-04-16 LAB — SALICYLATE LEVEL: Salicylate Lvl: <7 mg/dL (ref 2.8–30.0)

## 2018-04-16 MED ORDER — TRAZODONE HCL 50 MG PO TABS: 50.0000 mg | ORAL_TABLET | Freq: Every evening | ORAL | Status: DC | PRN

## 2018-04-16 MED ORDER — NICOTINE 21 MG/24HR TD PT24
21.0000 mg | MEDICATED_PATCH | Freq: Every day | TRANSDERMAL | Status: DC
  Filled 2018-04-16: qty 1

## 2018-04-16 MED ORDER — HYDROXYZINE HCL 25 MG PO TABS: 25.0000 mg | ORAL_TABLET | Freq: Three times a day (TID) | ORAL | Status: DC | PRN

## 2018-04-16 MED ORDER — OLANZAPINE 2.5 MG PO TABS
2.5000 mg | ORAL_TABLET | Freq: Every day | ORAL | Status: DC
  Filled 2018-04-16: qty 1

## 2018-04-16 MED ORDER — SERTRALINE HCL 50 MG PO TABS
50.0000 mg | ORAL_TABLET | Freq: Every day | ORAL | Status: DC
  Administered 2018-04-17: 50 mg via ORAL
  Filled 2018-04-16 (×3): qty 1

## 2018-04-16 MED ORDER — POTASSIUM CHLORIDE 10 MEQ/100ML IV SOLN
10.0000 meq | Freq: Once | INTRAVENOUS | Status: AC
  Administered 2018-04-16: 10 meq via INTRAVENOUS
  Filled 2018-04-16: qty 100

## 2018-04-16 NOTE — ED Provider Notes (Signed)
63 year old male received at sign out from [place] pending TTS consult. Per previous HPIs:   "Kamani Rutenberg is a 63 y.o. male who presents to the Emergency Department complaining of SI. He presents to the emergency department complaining of suicidal thoughts. He states he has ongoing suicidal thoughts and occasional hallucinations but does not currently have a plan. He states that he feels more suicidal when he goes outside. He states that a friend brought him to the emergency department. He is here voluntarily. He does have a history of alcohol and drug abuse. He drank alcohol today, last drug use was crack cocaine yesterday. He denies any fevers, chest pain, shortness of breath, nausea, vomiting. He states that he has been out of his psychiatric medications for one or two months. He is currently homeless.  2255: Patient handed off to me by EDMD Dr. Madilyn Hook at shift change pending labs, TTS consult.  Please see previous note for full details.  Briefly, patient presents for suicidal ideations and occasional hallucinations but without specific plan.  Has been noncompliant with psych meds for several weeks.  Endorses EtOH and crack cocaine use recently.  Currently homeless."  Physical Exam  BP 102/66   Pulse 63   Temp 98.2 F (36.8 C) (Oral)   Resp 18   SpO2 96%   Physical Exam  Resting comfortably.  Snoring.  Arouses to painful stimuli.  He endorses suicidal ideation.  He awakens, speaks in complete, fluent sentences, and then returns to sleep.  ED Course/Procedures   Clinical Course as of Apr 15 716  Fri Apr 15, 2018  2248 RN notified me pt is unarousable.  He keeps falling asleep during conversation.  I evaluated pt.  He wakes up with loud voice.  He can answer yes/no to brief questions, frequently falls asleep during questions and needs to be aroused.  He states he is having SI but "not as bad as before". Denies HI.  I do not think he will stay awake for TTS consult.  Will recheck  CBG, ammonia, finish IVF.  Will likely need reassessment in the AM.     [CG]    Clinical Course User Index [CG] Liberty Handy, PA-C    Procedures  MDM   63 year old male with a history of polysubstance abuse, community-acquired pneumonia, cocaine abuse, substance-induced mood disorder, HTN, homelessness who is received at signout from [place] pending TTS consult.  Please see previous provider notes for further work-up and medical decision making.  Notified by nursing staff that there was concern about the patient continuing to be difficult to arouse.  There is also concerned that he had not voided since he had been in the ER for more than 10 hours.  Condom cath was in place.  On my initial exam, the patient was sleeping comfortably, but was arousable to loud voice and painful stimuli.  When pressure was applied to the nailbed, the patient opened his eyes and said "why are you trying to hurt me like that."  Bladder scan with 200 mL's.  On reevaluation, the patient was sat upright and he was able to open his eyes and became agitated and was yelling that he wanted to sleep.  Ultimately, we were able to stand with the patient he was able to ambulate to the bathroom and provide a urine sample.  10 used to endorse suicidal ideation.  Secretary has called TTS for consult and he is first on the list at 7 AM.  The patient remains  voluntary at this time.  Patient care transferred to [facility] at the end of my shift. Patient presentation, ED course, and plan of care discussed with review of all pertinent labs and imaging. Please see his/her note for further details regarding further ED course and disposition.     Frederik Pear A, PA-C 04/16/18 [id number]    Glynn Octave, MD 04/16/18 [phone]

## 2018-04-16 NOTE — ED Notes (Signed)
Breakfast ordered 

## 2018-04-16 NOTE — Progress Notes (Signed)
Patient is seen by me via tele-psych.  Patient has slow speech and rambles.  Patient is denying that any of the previous visits to the ED is or to the inpatient unit here is not him however patient confirms a date of birth, name and the address that is listed in the patient's chart.  Patient's conversation does not appear to be very coherent.  Patient seems to be slurring his speech some and reports that his last drug use was just before coming into the hospital.  Patient cannot distinguish exactly what type of treatment he needs but states that his drug use is due to his depression.  I asked patient if previous medications helped him any and he stated that the estate did stabilize him some but he never followed up with anyone and he never went to a [facility] after his last admission, but still denies being in the hospital in September 2019.  We will restart home medications and have patient observed overnight for stabilization and clarity.

## 2018-04-16 NOTE — ED Notes (Signed)
Pt resting , no complaints , sitter at bedside

## 2018-04-16 NOTE — BH Assessment (Addendum)
Tele Assessment Note   Patient Name: Timothy Mcmillan MRN: [id number] Referring Physician: Sharen Heck, PA Location of Patient: [facility] Location of Provider: Behavioral Health TTS Department  [name] is an 63 y.o. male who presents voluntarily accompanied reporting primary symptoms depression and he wants to get back on his medications that has helped him in the past (Atarax). He says that he  Has lost a relationship, which is making him depressed. He endorses suicidal ideation with a plan to drive into oncoming traffic, but , "I don't want to hurt someone else".  Pt acknowledges symptoms including crying spells, social withdrawal, loss of interest in usual pleasures, decreased concentration, fatigue, irritability, decreased sleep, decreased appetite and feelings of hopelessness.  Pt denies HI, current psychosis. PT describes 3 or 4 past attempts, denies history of violence. Pt states that he has no family or friends that are supportive and he refused to give permission for TTS staff to contact anyone at this time.  Pt identifies primary stressors as relationship problems. Pt identifies primary residence as his apartment , alone. Pt identifies primary supports as none. Pt's social history includes*binge use of cocaine, heroin, alcohol and experimental use of Molly. 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 Pt identifies legal involvement as past charges in the 90's of possession with attempt to sell. Pt identifies abuse history as none.  Pt identifies current/previous treatment as no OP provider, IP at [facility]. Pt reports medication non-compliant. Pt describes family MH/SA history, "All my uncles are on disability, my mom's side of the family has MH issues". Pt has poor insight and judgment. Pt's memory is typical.? ? MSE: Pt is casually dressed, alert, oriented x4 with normal speech and slowed motor behavior. Eye contact is poor. Pt's mood  is depressed and affect is depressed and irritable. Affect is congruent with mood. Thought process is coherent and relevant. There is no indication that pt is currently responding to internal stimuli or experiencing delusional thought content. Pt was minimally cooperative throughout assessment.   Reola Calkins, NP, recommends observation for safety, will make medication recommendations and re-evaluate pt.in AM. Feliz Beam notified [facility] staff. Diagnosis: Primary Mental Health  F33.2 MDD recurrent severe, without psychosis F10.20 Alcohol use disorder Severe F14.20 Cocaine use disorder, Severe   Past Medical History:  Past Medical History:  Diagnosis Date  . Depression   . ETOH abuse   . Hypertension   . RSD (reflex sympathetic dystrophy)     Past Surgical History:  Procedure Laterality Date  . EYE SURGERY    . HERNIA REPAIR    . surgery to lower L leg      Family History:  Family History  Problem Relation Age of Onset  . Diabetes type II Father   . Stroke Father     Social History:  reports that he has been smoking cigarettes. He has been smoking about 0.50 packs per day. He has never used smokeless tobacco. He reports current alcohol use. He reports current drug use. Drugs: Cocaine and "Crack" cocaine.  Additional Social History:  Alcohol / Drug Use Pain Medications: denies Prescriptions: denies Over the Counter: denies History of alcohol / drug use?: Yes Longest period of sobriety (when/how long): 6-7 yrs, 10 years Negative Consequences of Use: Personal relationships Substance #1 Name of Substance 1: alcohol 1 - Age of First Use: 20's 1 - Amount (size/oz): variable--binge drinker 1 - Frequency: variable--binge drinker 1 - Duration: variable--binge drinker 1 - Last Use / Amount: UTA  Substance #2 Name of Substance 2: Cocaine 2 - Age of First Use: UTA 2 - Amount (size/oz): variable--binge user 2 - Frequency: variable--binge user 2 - Duration: variable--binge user 2 -  Last Use / Amount: 10 years Substance #3 Name of Substance 3: Heroin 3 - Age of First Use: UTA 3 - Amount (size/oz): variable--binge user 3 - Frequency: variable--binge user 3 - Duration: variable--binge user 3 - Last Use / Amount: "i couldn't get any, but I tried because it helps me calm down"  CIWA: CIWA-Ar BP: 102/66 Pulse Rate: 63 COWS:    Allergies:  Allergies  Allergen Reactions  . Mold Extract [Trichophyton] Shortness Of Breath    Home Medications: (Not in a hospital admission)   OB/GYN Status:  No LMP for male patient.  General Assessment Data Assessment unable to be completed: Yes Reason for not completing assessment: TTS spoke with Reita ClicheBobby, RN who states the pt is still sleeping and unable to be assessed. RN will contact TTS when the pt is alert and able to participate in the assessment.  [facility] TTS Assessment: In system Is this a Tele or Face-to-Face Assessment?: Tele Assessment Is this an Initial Assessment or a Re-assessment for this encounter?: Initial Assessment Patient Accompanied by:: N/A Language Other than English: No Living Arrangements: (apartment) What gender do you identify as?: Male Marital status: Other (comment)(lost a relationship) Living Arrangements: Alone Can pt return to current living arrangement?: Yes Admission Status: Voluntary Is patient capable of signing voluntary admission?: Yes Referral Source: Self/Family/Friend Insurance type: UHC/MCR     Crisis Care Plan Living Arrangements: Alone Legal Guardian: (NA) Name of Psychiatrist: (none) Name of Therapist: none  Education Status Is patient currently in school?: No Is the patient employed, unemployed or receiving disability?: Unemployed  Risk to self with the past 6 months Suicidal Ideation: Yes-Currently Present Has patient been a risk to self within the past 6 months prior to admission? : No Suicidal Intent: Yes-Currently Present Has patient had any  suicidal intent within the past 6 months prior to admission? : Yes Is patient at risk for suicide?: Yes Suicidal Plan?: Yes-Currently Present Has patient had any suicidal plan within the past 6 months prior to admission? : Yes Specify Current Suicidal Plan: drive car into oncoming traffic Access to Means: Yes Specify Access to Suicidal Means: car What has been your use of drugs/alcohol within the last 12 months?: see SA section Previous Attempts/Gestures: Yes How many times?: (3-4) Other Self Harm Risks: none known Triggers for Past Attempts: Unpredictable Intentional Self Injurious Behavior: None Family Suicide History: No Recent stressful life event(s): Loss (Comment)(of a relationship) Persecutory voices/beliefs?: No Depression: Yes Depression Symptoms: Despondent, Insomnia, Isolating, Feeling angry/irritable, Feeling worthless/self pity, Loss of interest in usual pleasures, Fatigue Substance abuse history and/or treatment for substance abuse?: Yes Suicide prevention information given to non-admitted patients: Not applicable  Risk to Others within the past 6 months Homicidal Ideation: No Does patient have any lifetime risk of violence toward others beyond the six months prior to admission? : No Thoughts of Harm to Others: No Current Homicidal Intent: No Current Homicidal Plan: No Access to Homicidal Means: No History of harm to others?: No Assessment of Violence: None Noted Does patient have access to weapons?: No Criminal Charges Pending?: No Does patient have a court date: No Is patient on probation?: No  Psychosis Hallucinations: None noted Delusions: None noted  Mental Status Report Appearance/Hygiene: Unremarkable, In scrubs Eye Contact: Poor Motor Activity: Psychomotor retardation  Speech: Logical/coherent, Argumentative Level of Consciousness: Sedated, Irritable Mood: Irritable, Depressed Affect: Depressed, Irritable Anxiety Level: Panic Attacks Panic attack  frequency: (unk) Most recent panic attack: (yesterday) Thought Processes: Coherent, Relevant Judgement: Impaired Orientation: Person, Place, Time, Situation, Appropriate for developmental age Obsessive Compulsive Thoughts/Behaviors: None  Cognitive Functioning Concentration: Poor Memory: Recent Intact, Remote Intact Is patient IDD: No Insight: Poor Impulse Control: Poor Appetite: Poor Have you had any weight changes? : Loss Amount of the weight change? (lbs): 20 lbs Sleep: Decreased Total Hours of Sleep: ("none") Vegetative Symptoms: None  ADLScreening [facility] Assessment Services) Patient's cognitive ability adequate to safely complete daily activities?: Yes Patient able to express need for assistance with ADLs?: Yes Independently performs ADLs?: Yes (appropriate for developmental age)  Prior Inpatient Therapy Prior Inpatient Therapy: Yes Prior Therapy Dates: UTS Prior Therapy Facilty/Provider(s): [facility] Reason for Treatment: MH SA  Prior Outpatient Therapy Prior Outpatient Therapy: No Does patient have an ACCT team?: No Does patient have Intensive In-House Services?  : No Does patient have [facility] services? : No Does patient have P4[facility] services?: No  ADL Screening (condition at time of admission) Patient's cognitive ability adequate to safely complete daily activities?: Yes Is the patient deaf or have difficulty hearing?: No Does the patient have difficulty seeing, even when wearing glasses/contacts?: No Does the patient have difficulty concentrating, remembering, or making decisions?: No Patient able to express need for assistance with ADLs?: Yes Does the patient have difficulty dressing or bathing?: No Independently performs ADLs?: Yes (appropriate for developmental age) Does the patient have difficulty walking or climbing stairs?: No Weakness of Legs: None Weakness of Arms/Hands: None  Home Assistive Devices/Equipment Home Assistive Devices/Equipment: None  Therapy  Consults (therapy consults require a physician order) PT Evaluation Needed: No OT Evalulation Needed: No SLP Evaluation Needed: No Abuse/Neglect Assessment (Assessment to be complete while patient is alone) Abuse/Neglect Assessment Can Be Completed: Yes Physical Abuse: Denies Verbal Abuse: Denies Sexual Abuse: Denies Exploitation of patient/patient's resources: Denies Self-Neglect: Denies Values / Beliefs Cultural Requests During Hospitalization: None Spiritual Requests During Hospitalization: None Consults Spiritual Care Consult Needed: No Social Work Consult Needed: No Merchant navy officer (For Healthcare) Does Patient Have a Medical Advance Directive?: No Would patient like information on creating a medical advance directive?: No - Patient declined          Disposition:  Disposition Initial Assessment Completed for this Encounter: Yes  This service was provided via telemedicine using a 2-way, interactive audio and video technology.  Names of all persons participating in this telemedicine service and their role in this encounter. Name: Barrington Ellison, [facility] Role: TTS specialist             [facility] 04/16/2018 8:16 AM

## 2018-04-16 NOTE — ED Provider Notes (Signed)
  Physical Exam  BP 102/66   Pulse 63   Temp 98.2 F (36.8 C) (Oral)   Resp 18   SpO2 96%   Physical Exam  ED Course/Procedures   Clinical Course as of Apr 16 1338  Fri Apr 15, 2018  2248 RN notified me pt is unarousable.  He keeps falling asleep during conversation.  I evaluated pt.  He wakes up with loud voice.  He can answer yes/no to brief questions, frequently falls asleep during questions and needs to be aroused.  He states he is having SI but "not as bad as before". Denies HI.  I do not think he will stay awake for TTS consult.  Will recheck CBG, ammonia, finish IVF.  Will likely need reassessment in the AM.     [CG]    Clinical Course User Index [CG] Liberty Handy, PA-C    Procedures  MDM  Patient care received from [facility] M. PA at shift change please see her note for a full detail. Briefly, patient with who has been signed out from multiple providers had a full workup yesterday which was reassuring. Patient has been tried to be cleared by TTS but when tried to contact he has been a sleep. Patient this morning arousal to painful stimuli, states he is still suicidal pending TTS consult. Patient is medically cleared.  Will need RX for oral potassium pending discharge.   9:13 AM Patient had TTS consult, per Feliz Beam psychiatry NP patient "does not appear very coherent ", cannot extinguish what type of treatment he needs.  According to their plan they will observe him overnight for stabilization and clarity.  CTS consult will be placed again as patient has been here for 18 hours and spent overnight in hospital ED.  According to chart he has received 2 L of fluids, potassium replacement, was medically clear previously. 10:01 AM spoke to [facility] nurse practitioner psychiatry who states it was very hard to get patient's story, he reports patient does not recall his last visit to the hospital or being seen there.  He does know his address, date of birth, name.  At this time is unknown what  kind of drugs patient had.  We will continue to monitor for patient to come back to baseline. TTS # [id number] 1:34 PM patient is ambulatory walking around the ED, currently eating lunch, will place call for TTS to evaluate as patient is currently alert and oriented. 2:57 PM multiple attempts to have patient be cleared by TTS, per TTS patient needs to be monitored overnight prior to discharge.   Claude Manges, PA-C 04/16/18 1457    Glynn Octave, MD 04/16/18 2008

## 2018-04-16 NOTE — ED Notes (Signed)
Attempting TTS

## 2018-04-16 NOTE — ED Notes (Signed)
Meal tray delivered and at bedside.  

## 2018-04-16 NOTE — ED Notes (Signed)
RN and NT placed condom cath on pt in attempts to get urine.  Pt also moved to a hospital bed for comfort, again pt fell asleep as we were standing him to change beds.  Needed constant stimulation so that pt could follow directions. Pt continues to mumble, not be able to answer questions.  Pt placed in progressive bed in front of RN station for safety.

## 2018-04-16 NOTE — ED Notes (Signed)
Ordered diet tray for pt  

## 2018-04-16 NOTE — ED Notes (Signed)
Pt resting, no complaints.

## 2018-04-16 NOTE — Progress Notes (Signed)
TTS spoke with Reita Cliche, RN who states the pt is still sleeping and unable to be assessed. RN will contact TTS when the pt is alert and able to participate in the assessment.   Princess Bruins, MSW, LCSW Therapeutic Triage Specialist  [phone]

## 2018-04-16 NOTE — ED Notes (Signed)
7 am shift to do TTS, He is first on the list

## 2018-04-16 NOTE — ED Notes (Signed)
RN informed PA that pt has not produced any urine despite receiving fluids.  Bladder scan completed, reading indicate in bladder.

## 2018-04-16 NOTE — BH Assessment (Signed)
Received call from Nelly Rout [phone] who identified herself as a friend of the Pt. TTS explained we could not confirm or deny Pt was at [facility]. Pt says she wanted to give information because she does not believe Pt should be discharged. She says Pt was been walking outside with no shoes on. She says Pt is paranoid and afraid to go into his apartment because he believes strangers are going into the house. She says Pt has been following her and accusing her of doing things such as rigging his car radio to play certain songs and plotting to harm him.   Pamalee Leyden, [facility], [facility], [facility] Triage Specialist [phone]

## 2018-04-17 DIAGNOSIS — F332 Major depressive disorder, recurrent severe without psychotic features: Secondary | ICD-10-CM | POA: Diagnosis not present

## 2018-04-17 LAB — BASIC METABOLIC PANEL
Anion gap: 6 (ref 5–15)
BUN: 11 mg/dL (ref 8–23)
CO2: 22 mmol/L (ref 22–32)
Calcium: 8.2 mg/dL — ABNORMAL LOW (ref 8.9–10.3)
Chloride: 114 mmol/L — ABNORMAL HIGH (ref 98–111)
Creatinine, Ser: 1.24 mg/dL (ref 0.61–1.24)
GFR calc Af Amer: >60 mL/min (ref >60)
GFR calc non Af Amer: >60 mL/min (ref >60)
Glucose, Bld: 102 mg/dL — ABNORMAL HIGH (ref 70–99)
Potassium: 3.7 mmol/L (ref 3.5–5.1)
SODIUM: 142 mmol/L (ref 135–145)

## 2018-04-17 NOTE — ED Notes (Signed)
Pt sleeping in hall on a stretcher- belongings at bedside, phone with pt in bed.

## 2018-04-17 NOTE — ED Notes (Signed)
Lunch ordered reg diet, no sharps

## 2018-04-17 NOTE — ED Notes (Signed)
[facility] notified - pt was ambulatory to bathroom, facetiming friend on cell phone, did eat all of breakfast.  Pt states that he sees people that are not here and hears voices. No command auditory hallucinations. Denies being SI or HI.

## 2018-04-17 NOTE — ED Notes (Addendum)
Pt endorses SI at discharge, Pa informed and says to uphold decision from [facility] this morning.  Refused to sign e-signature pad. Escorted out by security.

## 2018-04-17 NOTE — ED Notes (Signed)
Breakfast Tray Ordered. 

## 2018-04-17 NOTE — Progress Notes (Signed)
Patient is seen by me via tele-psych and I have consulted with Dr. Lucianne Muss.  Patient is witnessed getting off of the stretcher and walking to the chair to speak to me with tele-psych and appears to have difficulty ambulating to the chair.  Patient reports that "nothing is changed for me I feel the same way."  Patient states he still has some auditory hallucinations and then reports that he has suicidal ideations as well.  Patient continues to say that he has a car and he would possibly driving down the road towards someone.  Upon further questioning with the patient he then reports that he has transportation issues as he has no way to go to make it to [facility] for his outpatient appointments.  Is also been documented that patient has been reporting to nursing staff that he is not suicidal or homicidal and that he does not have any command hallucinations.  It is also been reported the patient has been ambulating to the bathroom with no complications.  Patient had access to a cell phone and has been face timing his friends and making phone calls and does not appear to be depressed or did appear to be having any internal or external stimuli.  Feel that patient is malingering and is seeking secondary gain from the hospital and is putting on a performance when he is in front of the tele-psych machine.  Do not feel the patient needs to be admitted.  At this time patient does not meet inpatient criteria and is psychiatrically cleared.  I have contacted Elizabeth Sauer, PA-C and notified her of the recommendations and she is in agreement.

## 2018-04-17 NOTE — Discharge Instructions (Signed)
Follow up with [facility].   Return to ER for new or worsening symptoms, any additional concerns.

## 2018-04-24 ENCOUNTER — Emergency Department (HOSPITAL_COMMUNITY)
Admission: EM | Admit: 2018-04-24 | Discharge: 2018-04-24 | Disposition: A | Discharge status: Home / Self Care | Payer: Medicare Other | Attending: Emergency Medicine | Admitting: Emergency Medicine

## 2018-04-24 ENCOUNTER — Other Ambulatory Visit: Payer: Self-pay

## 2018-04-24 DIAGNOSIS — F329 Major depressive disorder, single episode, unspecified: Secondary | ICD-10-CM | POA: Insufficient documentation

## 2018-04-24 DIAGNOSIS — Z046 Encounter for general psychiatric examination, requested by authority: Secondary | ICD-10-CM | POA: Diagnosis not present

## 2018-04-24 DIAGNOSIS — I10 Essential (primary) hypertension: Secondary | ICD-10-CM | POA: Diagnosis not present

## 2018-04-24 DIAGNOSIS — R45851 Suicidal ideations: Secondary | ICD-10-CM | POA: Diagnosis not present

## 2018-04-24 DIAGNOSIS — R400 Somnolence: Secondary | ICD-10-CM | POA: Diagnosis not present

## 2018-04-24 DIAGNOSIS — F17210 Nicotine dependence, cigarettes, uncomplicated: Secondary | ICD-10-CM | POA: Diagnosis not present

## 2018-04-24 DIAGNOSIS — J45909 Unspecified asthma, uncomplicated: Secondary | ICD-10-CM | POA: Diagnosis not present

## 2018-04-24 DIAGNOSIS — F1994 Other psychoactive substance use, unspecified with psychoactive substance-induced mood disorder: Secondary | ICD-10-CM | POA: Diagnosis present

## 2018-04-24 DIAGNOSIS — Z79899 Other long term (current) drug therapy: Secondary | ICD-10-CM | POA: Diagnosis not present

## 2018-04-24 LAB — RAPID URINE DRUG SCREEN, HOSP PERFORMED
Amphetamines: NOT DETECTED
Barbiturates: NOT DETECTED
Benzodiazepines: NOT DETECTED
Cocaine: POSITIVE — AB
Opiates: NOT DETECTED
Tetrahydrocannabinol: NOT DETECTED

## 2018-04-24 LAB — COMPREHENSIVE METABOLIC PANEL
ALT: 14 U/L (ref 0–44)
AST: 17 U/L (ref 15–41)
Albumin: 4.2 g/dL (ref 3.5–5.0)
Alkaline Phosphatase: 71 U/L (ref 38–126)
Anion gap: 9 (ref 5–15)
BILIRUBIN TOTAL: 0.6 mg/dL (ref 0.3–1.2)
BUN: 13 mg/dL (ref 8–23)
CALCIUM: 8.7 mg/dL — AB (ref 8.9–10.3)
CO2: 24 mmol/L (ref 22–32)
Chloride: 108 mmol/L (ref 98–111)
Creatinine, Ser: 1.13 mg/dL (ref 0.61–1.24)
GFR calc Af Amer: >60 mL/min (ref >60)
GFR calc non Af Amer: >60 mL/min (ref >60)
Glucose, Bld: 117 mg/dL — ABNORMAL HIGH (ref 70–99)
Potassium: 3.3 mmol/L — ABNORMAL LOW (ref 3.5–5.1)
Sodium: 141 mmol/L (ref 135–145)
Total Protein: 7.4 g/dL (ref 6.5–8.1)

## 2018-04-24 LAB — SALICYLATE LEVEL: Salicylate Lvl: <7 mg/dL (ref 2.8–30.0)

## 2018-04-24 LAB — CBC
HCT: 49.5 % (ref 39.0–52.0)
Hemoglobin: 16.4 g/dL (ref 13.0–17.0)
MCH: 32.3 pg (ref 26.0–34.0)
MCHC: 33.1 g/dL (ref 30.0–36.0)
MCV: 97.6 fL (ref 80.0–100.0)
Platelets: 270 10*3/uL (ref 150–400)
RBC: 5.07 MIL/uL (ref 4.22–5.81)
RDW: 13.7 % (ref 11.5–15.5)
WBC: 10.5 10*3/uL (ref 4.0–10.5)
nRBC: 0 % (ref 0.0–0.2)

## 2018-04-24 LAB — ACETAMINOPHEN LEVEL: Acetaminophen (Tylenol), Serum: <10 ug/mL — ABNORMAL LOW (ref 10–30)

## 2018-04-24 LAB — ETHANOL

## 2018-04-24 MED ORDER — GABAPENTIN 300 MG PO CAPS
300.0000 mg | ORAL_CAPSULE | Freq: Two times a day (BID) | ORAL | Status: DC
  Administered 2018-04-24: 300 mg via ORAL
  Filled 2018-04-24: qty 1

## 2018-04-24 MED ORDER — NICOTINE 21 MG/24HR TD PT24
21.0000 mg | MEDICATED_PATCH | Freq: Every day | TRANSDERMAL | Status: DC
  Administered 2018-04-24: 21 mg via TRANSDERMAL
  Filled 2018-04-24: qty 1

## 2018-04-24 MED ORDER — OLANZAPINE 2.5 MG PO TABS: 2.5000 mg | ORAL_TABLET | Freq: Every day | ORAL | Status: DC

## 2018-04-24 MED ORDER — HYDROXYZINE HCL 25 MG PO TABS: 25.0000 mg | ORAL_TABLET | Freq: Three times a day (TID) | ORAL | Status: DC | PRN

## 2018-04-24 MED ORDER — AMLODIPINE BESYLATE 5 MG PO TABS
10.0000 mg | ORAL_TABLET | Freq: Every day | ORAL | Status: DC
  Administered 2018-04-24: 10 mg via ORAL
  Filled 2018-04-24: qty 2

## 2018-04-24 MED ORDER — BUSPIRONE HCL 10 MG PO TABS
5.0000 mg | ORAL_TABLET | Freq: Two times a day (BID) | ORAL | Status: DC
  Administered 2018-04-24: 5 mg via ORAL
  Filled 2018-04-24: qty 1

## 2018-04-24 NOTE — [facility] Counselor (Signed)
Patient continues to be sleep, unable to awake with verbal prompts.

## 2018-04-24 NOTE — BH Assessment (Signed)
Tele Assessment Note   Patient Name: Timothy Mcmillan MRN: [id number] Referring Physician: Zadie Rhine, MD Location of Patient: [facility] Location of Provider: Behavioral Health TTS Department  Timothy Mcmillan is an 63 y.o. male who present to [facility] with suicidal thoughts with a plan to overdose or drive his car off a bridge. When TTS spoke with patient, patient denied suicidal ideations and report feeling depressed triggered by the break-up with his fiance. Patient had refused to provide urine but admitted to abusing alcohol and cocaine. Patient has a history of presenting to the hospital with suicidal ideations with a plan triggered by substance induced. Patient irritable and rude when speaking. Patient denied history of trauma and reports he does not work. Report he's unemployed and lives alone. Depressive symptoms reported crying, isolations, substance use, irritability, suicidal ideations. Patient oriented x4 and dressed in shrubs. Patient report diagnosis history of Bipolar, Schizophrenia and Depression. Patient denied auditory hallucinations report visual hallucinations of seeing his girlfriend even when she not present. Patient denied homicidal ideations.    Diagnosis: Bipolar and Schizophrenia   Past Medical History:  Past Medical History:  Diagnosis Date  . Depression   . ETOH abuse   . Hypertension   . RSD (reflex sympathetic dystrophy)     Past Surgical History:  Procedure Laterality Date  . EYE SURGERY    . HERNIA REPAIR    . surgery to lower L leg      Family History:  Family History  Problem Relation Age of Onset  . Diabetes type II Father   . Stroke Father     Social History:  reports that he has been smoking cigarettes. He has been smoking about 0.50 packs per day. He has never used smokeless tobacco. He reports current alcohol use. He reports current drug use. Drugs: Cocaine and "Crack" cocaine.  Additional Social History:  Alcohol / Drug Use Pain  Medications: see MAR Prescriptions: see MAR Over the Counter: see MAR History of alcohol / drug use?: Yes Substance #1 Name of Substance 1: alcohol 1 - Age of First Use: 20's 1 - Amount (size/oz): variable--binge drinker 1 - Frequency: variable--binge drinker 1 - Duration: variable--binge drinker 1 - Last Use / Amount: 04/22/2012 Substance #2 Name of Substance 2: Cocaine 2 - Age of First Use: UTA 2 - Amount (size/oz): variable--binge user 2 - Frequency: variable--binge user 2 - Duration: variable--binge user 2 - Last Use / Amount: pt self report he recently used   CIWA: CIWA-Ar BP: (!) 158/94 Pulse Rate: (!) 59 COWS:    Allergies:  Allergies  Allergen Reactions  . Mold Extract [Trichophyton] Shortness Of Breath  . Morphine Nausea And Vomiting    Home Medications: (Not in a hospital admission)   OB/GYN Status:  No LMP for male patient.  General Assessment Data Location of Assessment: [facility] TTS Assessment: In system Is this a Tele or Face-to-Face Assessment?: Face-to-Face Is this an Initial Assessment or a Re-assessment for this encounter?: Initial Assessment Patient Accompanied by:: N/A Language Other than English: No Living Arrangements: Other (Comment)(Lives alone) What gender do you identify as?: Male Marital status: Other (comment)(report lost relationship ) Living Arrangements: Alone Can pt return to current living arrangement?: Yes Admission Status: Voluntary Is patient capable of signing voluntary admission?: Yes Referral Source: Self/Family/Friend Insurance type: [employer]     Crisis Care Plan Living Arrangements: Alone Legal Guardian: Other:(self) Name of Psychiatrist: none report  Name of Therapist: None report   Education Status Is patient currently  in school?: No Is the patient employed, unemployed or receiving disability?: Unemployed  Risk to self with the past 6 months Suicidal Ideation: No-Not Currently/Within Last 6  Months(denied SI report depression ) Has patient been a risk to self within the past 6 months prior to admission? : No Suicidal Intent: No-Not Currently/Within Last 6 Months Has patient had any suicidal intent within the past 6 months prior to admission? : Yes Is patient at risk for suicide?: No Suicidal Plan?: No-Not Currently/Within Last 6 Months Has patient had any suicidal plan within the past 6 months prior to admission? : Yes Specify Current Suicidal Plan: drive car into ongoing traffic Access to Means: Yes Specify Access to Suicidal Means: car What has been your use of drugs/alcohol within the last 12 months?: alcohol and cocaine per report from client  Previous Attempts/Gestures: Yes How many times?: 3 Other Self Harm Risks: substance use  Triggers for Past Attempts: Unpredictable Intentional Self Injurious Behavior: None Family Suicide History: No Recent stressful life event(s): Loss (Comment)(loss of a relationship ) Persecutory voices/beliefs?: No Depression: Yes Depression Symptoms: Despondent, Insomnia, Isolating, Feeling worthless/self pity, Loss of interest in usual pleasures Substance abuse history and/or treatment for substance abuse?: Yes Suicide prevention information given to non-admitted patients: Not applicable  Risk to Others within the past 6 months Homicidal Ideation: No Does patient have any lifetime risk of violence toward others beyond the six months prior to admission? : No Thoughts of Harm to Others: No Current Homicidal Intent: No Current Homicidal Plan: No Access to Homicidal Means: No History of harm to others?: No Assessment of Violence: None Noted Does patient have access to weapons?: No Criminal Charges Pending?: No Does patient have a court date: No Is patient on probation?: No  Psychosis Hallucinations: None noted Delusions: None noted  Mental Status Report Appearance/Hygiene: Unremarkable, In scrubs Eye Contact: Poor Motor Activity:  Psychomotor retardation Speech: Logical/coherent, Argumentative Level of Consciousness: Sedated, Irritable Mood: Irritable, Depressed Affect: Depressed, Irritable Anxiety Level: None Thought Processes: Coherent, Relevant Judgement: Unimpaired Orientation: Person, Place, Time, Situation, Appropriate for developmental age Obsessive Compulsive Thoughts/Behaviors: None  Cognitive Functioning Concentration: Poor Memory: Recent Intact, Remote Intact Is patient IDD: No Insight: Poor Impulse Control: Poor Appetite: Poor Have you had any weight changes? : Loss Amount of the weight change? (lbs): 20 lbs Sleep: Decreased Total Hours of Sleep: (did not report ) Vegetative Symptoms: None  ADLScreening [facility] Assessment Services) Patient's cognitive ability adequate to safely complete daily activities?: Yes Patient able to express need for assistance with ADLs?: Yes Independently performs ADLs?: Yes (appropriate for developmental age)  Prior Inpatient Therapy Prior Inpatient Therapy: Yes Prior Therapy Dates: UTS Prior Therapy Facilty/Provider(s): [facility] Reason for Treatment: MH SA  Prior Outpatient Therapy Prior Outpatient Therapy: No Does patient have an ACCT team?: No Does patient have Intensive In-House Services?  : No Does patient have [facility] services? : No Does patient have P4CC services?: No  ADL Screening (condition at time of admission) Patient's cognitive ability adequate to safely complete daily activities?: Yes Is the patient deaf or have difficulty hearing?: No Does the patient have difficulty seeing, even when wearing glasses/contacts?: No Does the patient have difficulty concentrating, remembering, or making decisions?: No Patient able to express need for assistance with ADLs?: Yes Does the patient have difficulty dressing or bathing?: No Independently performs ADLs?: Yes (appropriate for developmental age)       Abuse/Neglect Assessment (Assessment to be complete  while patient is alone) Abuse/Neglect Assessment Can Be Completed:  Yes Physical Abuse: Denies Verbal Abuse: Denies Sexual Abuse: Denies Exploitation of patient/patient's resources: Denies     Merchant navy officer (For Healthcare) Does Patient Have a Medical Advance Directive?: No Would patient like information on creating a medical advance directive?: No - Patient declined          Disposition:  Disposition Initial Assessment Completed for this Encounter: Yes(Dr. A & Jones,NP, pt does not meet criteria for D/C )  Yassmin Binegar [facility] 04/24/2018 12:53 PM

## 2018-04-24 NOTE — Patient Outreach (Signed)
[facility] tried meeting with the patient to provide substance use recovery support and help with substance use treatment resources, but the patient does not want to speak with [facility]PSS at this time and does not want help with substance use treatment resources. [facility]PSS still provided the patient with substance use recovery resources including residential/outpatient substance use treatment center list, AA/NA meeting list, and [facility]PSS contact information. CPSS strongly encourage the patient to contact [facility] if he ever needs help with getting connected to substance use recovery resources or would like to talk to [facility] for substance use recovery support.

## 2018-04-24 NOTE — ED Notes (Signed)
Dr Bebe Shaggy made aware that pt was moved to the secure unit.

## 2018-04-24 NOTE — ED Notes (Addendum)
TTS clinician attempted to complete assessment. Patient was asleep. Clinician tried multiple times, patient would utter about 2 words and rapidly fall asleep and begin to snore loudly, Robyn, RN, present during attempts. TTS will attempt during next shift.

## 2018-04-24 NOTE — BHH Suicide Risk Assessment (Cosign Needed)
Suicide Risk Assessment  Discharge Assessment   [facility] Discharge Suicide Risk Assessment   Principal Problem: Substance induced mood disorder (HCC) Discharge Diagnoses: Principal Problem:   Substance induced mood disorder (HCC)   Total Time spent with patient: 30 minutes  Musculoskeletal: Strength & Muscle Tone: within normal limits Gait & Station: normal Patient leans: N/A  Psychiatric Specialty Exam:   Blood pressure (!) 158/94, pulse (!) 59, temperature 98 F (36.7 C), temperature source Oral, resp. rate 16, height 6\' 3"  (1.905 m), weight 101.6 kg, SpO2 95 %.Body mass index is 28 kg/m.  General Appearance: Casual  Eye Contact::  Fair  Speech:  Clear and Coherent and Slow409  Volume:  Decreased  Mood:  Irritable  Affect:  Congruent  Thought Process:  Coherent and Descriptions of Associations: Intact  Orientation:  Full (Time, Place, and Person)  Thought Content:  Logical  Suicidal Thoughts:  No  Homicidal Thoughts:  No  Memory:  Immediate;   Good Recent;   Good Remote;   Fair  Judgement:  Fair  Insight:  Fair  Psychomotor Activity:  Decreased  Concentration:  Good  Recall:  Good  Fund of Knowledge:Good  Language: Good  Akathisia:  No  Handed:  Right  AIMS (if indicated):     Assets:  Architect Housing  Sleep:     Cognition: WNL  ADL's:  Intact   Mental Status Per Nursing Assessment::   On Admission:   Pt was seen and chart reviewed. Pt is groggy and irritable. Pt stated he had a recent break-up with his fiance'. He has not given a urine sample but self-admits to cocaine and alcohol. His BAL is negative. Pt has been calm and cooperative in the emergency room and has been asleep and difficult to wake up. Pt stated he was inpatient in a psychiatric hospital 3 years ago but not in [place]. He also stated he did not know what his medications are. He will be referred to [facility] for follow up. Pt is psychiatrically clear.    Demographic Factors:  Male  Loss Factors: NA  Historical Factors: Family history of mental illness or substance abuse  Risk Reduction Factors:   Sense of responsibility to family  Continued Clinical Symptoms:  Alcohol/Substance Abuse/Dependencies  Cognitive Features That Contribute To Risk:  Closed-mindedness    Suicide Risk:  Minimal: No identifiable suicidal ideation.  Patients presenting with no risk factors but with morbid ruminations; may be classified as minimal risk based on the severity of the depressive symptoms    Plan Of Care/Follow-up recommendations:  Activity:  as tolerated  Diet:  Heart healthy  Laveda Abbe, NP 04/24/2018, 12:34 PM

## 2018-04-24 NOTE — ED Notes (Signed)
Patient is alert, oriented and verbal. Patient states "I am here because I need help." He did not go into details. Patient is SI without a plan. Patient contracts for safety while on unit. Patient provided support and encouragement. Q 15 minute checks in progress and patient is safe on unit. Monitoring continues.

## 2018-04-24 NOTE — ED Notes (Signed)
Patient discharged home. AVS/Discharge instructions/Follow-up care reviewed with patient and written copy given. Patient verbalized understanding. Patient alert, oriented and verbal and ambulatory at discharge. Patient vitals at discharge 98.0-158/94-59-16-95% room air. All patient belongings returned to him and he verified everything was returned. Patient denies HI and A/V hallucinations at discharged. Patient escorted to lobby by staff. Patient has no questions at this time.

## 2018-04-24 NOTE — ED Triage Notes (Signed)
Pt having suicidal thought with plan to overdose or drive his car off a bridge. Currently tearful some what guarded and has repetative speech. "I don't know what trigger this thoughts but I just can turn them off".

## 2018-04-24 NOTE — ED Notes (Signed)
Pt belongings placed in locker 36.

## 2018-04-24 NOTE — ED Notes (Signed)
Pt calm pt answers questions slowly. Pt noted with a flat affect. Pt c/o si with a plan to od on home meds. Pt denies any hi or avh at this time. Pt calm and resting in bed. Pt contracts to safety. Will continue to monitor.

## 2018-04-24 NOTE — ED Provider Notes (Signed)
[facility]-EMERGENCY DEPT Provider Note   CSN: [id number] Arrival date & time: 04/24/18  0220    History   Chief Complaint Chief Complaint  Patient presents with  . Medical Clearance   Level 5 caveat due to psychiatric disorder HPI Timothy Mcmillan is a 63 y.o. male.     The history is provided by the patient.  Mental Health Problem  Presenting symptoms: suicidal thoughts   Degree of incapacity (severity):  Moderate Onset quality:  Gradual Timing:  Constant Progression:  Unchanged Chronicity:  Recurrent Context: drug abuse   Relieved by:  Nothing Worsened by:  Drugs Patient presents after having suicidal thoughts.  He reported he was either an overdose or drive a car off a bridge.  He has a history of drug abuse.  He has no other complaints.  Past Medical History:  Diagnosis Date  . Depression   . ETOH abuse   . Hypertension   . RSD (reflex sympathetic dystrophy)     Patient Active Problem List   Diagnosis Date Noted  . Schizoaffective disorder, bipolar type (HCC) 11/03/2017  . Cocaine use disorder, severe, dependence (HCC) 05/25/2016  . Alcohol use disorder, moderate, dependence (HCC) 05/25/2016  . Essential hypertension 05/25/2016  . MDD (major depressive disorder), recurrent severe, without psychosis (HCC) 05/21/2016  . Severe episode of recurrent major depressive disorder, with psychotic features (HCC) 05/16/2016  . Abnormal EKG 03/18/2016  . Memory changes 03/18/2016  . Mild intermittent asthma without complication 03/18/2016  . Pterygium of both eyes 03/18/2016  . Retrograde ejaculation 03/18/2016  . Benign prostatic hyperplasia 03/13/2016  . Bipolar 1 disorder, depressed (HCC) 03/13/2016  . Complex regional pain syndrome type 1 of left lower extremity 03/13/2016  . Neck pain 03/13/2016  . Alcohol use disorder, severe, dependence (HCC) 02/16/2016  . Substance induced mood disorder (HCC) 10/07/2015  . Cocaine abuse (HCC)   .  Alcohol abuse   . Acute renal failure (HCC)   . Hypokalemia 11/14/2014  . Polysubstance abuse (HCC) 11/14/2014  . Alcohol withdrawal delirium (HCC) 11/14/2014  . CAP (community acquired pneumonia) 11/14/2014  . Alcohol withdrawal (HCC) 11/14/2014  . Acute renal failure (ARF) (HCC) 11/14/2014  . Acute encephalopathy   . Reflex sympathetic dystrophy 05/02/2010  . Hypertension, benign 03/21/2002    Past Surgical History:  Procedure Laterality Date  . EYE SURGERY    . HERNIA REPAIR    . surgery to lower L leg          Home Medications    Prior to Admission medications   Medication Sig Start Date End Date Taking? Authorizing Provider  albuterol (PROAIR HFA) 108 (90 Base) MCG/ACT inhaler Inhale 1-2 puffs into the lungs every 4 (four) hours as needed for wheezing or shortness of breath. 11/05/17  Yes Armandina Stammer I, NP  sertraline (ZOLOFT) 50 MG tablet Take 1 tablet (50 mg total) by mouth daily. For depression 11/06/17  Yes Armandina Stammer I, NP  sildenafil (REVATIO) 20 MG tablet Take 20-40 mg by mouth daily as needed for erectile dysfunction. 12/24/17  Yes [provider]  amLODipine (NORVASC) 10 MG tablet Take 1 tablet (10 mg total) by mouth daily. For high blood pressure Patient not taking: Reported on 04/24/2018 11/06/17   Armandina Stammer I, NP  busPIRone (BUSPAR) 5 MG tablet Take 1 tablet (5 mg total) by mouth 2 (two) times daily. For anxiety Patient not taking: Reported on 04/24/2018 11/05/17   Armandina Stammer I, NP  hydrOXYzine (ATARAX/VISTARIL) 25 MG tablet  Take 1 tablet (25 mg total) by mouth 3 (three) times daily as needed for anxiety. Patient not taking: Reported on 04/24/2018 11/05/17   Armandina Stammer I, NP  nicotine (NICODERM CQ - DOSED IN MG/24 HOURS) 21 mg/24hr patch Place 1 patch (21 mg total) onto the skin daily. (May purchase from over the counter): For smoking cessation Patient not taking: Reported on 04/16/2018 11/06/17   Armandina Stammer I, NP  OLANZapine (ZYPREXA) 2.5 MG tablet  Take 1 tablet (2.5 mg total) by mouth at bedtime. For mood control Patient not taking: Reported on 04/24/2018 11/05/17   Armandina Stammer I, NP  tamsulosin (FLOMAX) 0.4 MG CAPS capsule Take 1 capsule (0.4 mg total) by mouth 2 (two) times daily. For enlarged prostate Patient not taking: Reported on 04/24/2018 11/05/17   Armandina Stammer I, NP  traZODone (DESYREL) 50 MG tablet Take 1 tablet (50 mg total) by mouth at bedtime as needed for sleep. Patient not taking: Reported on 04/24/2018 11/05/17   Sanjuana Kava, NP    Family History Family History  Problem Relation Age of Onset  . Diabetes type II Father   . Stroke Father     Social History Social History   Tobacco Use  . Smoking status: Current Every Day Smoker    Packs/day: 0.50    Types: Cigarettes  . Smokeless tobacco: Never Used  . Tobacco comment: declined  Substance Use Topics  . Alcohol use: Yes    Comment: intermittently  . Drug use: Yes    Types: Cocaine, "Crack" cocaine     Allergies   Mold extract [trichophyton] and Morphine   Review of Systems Review of Systems  Unable to perform ROS: Psychiatric disorder  Psychiatric/Behavioral: Positive for suicidal ideas.     Physical Exam Updated Vital Signs BP (!) 166/100 (BP Location: Left Arm)   Pulse 73   Temp 98.8 F (37.1 C) (Oral)   Resp 15   Ht 1.905 m (6\' 3" )   Wt 101.6 kg   SpO2 95%   BMI 28.00 kg/m   Physical Exam CONSTITUTIONAL: Disheveled, sleeping on initial exam HEAD: Normocephalic/atraumatic EYES: EOMI ENMT: Mucous membranes moist NECK: supple no meningeal signs CV: S1/S2 noted, no murmurs/rubs/gallops noted LUNGS: Lungs are clear to auscultation bilaterally, no apparent distress ABDOMEN: soft, nontender NEURO: Pt is sleeping but arousable, follows commands, moves all extremities x4 EXTREMITIES: full ROM SKIN: warm, color normal PSYCH: Too sleepy to fully assess  ED Treatments / Results  Labs (all labs ordered are listed, but only abnormal  results are displayed) Labs Reviewed  COMPREHENSIVE METABOLIC PANEL - Abnormal; Notable for the following components:      Result Value   Potassium 3.3 (*)    Glucose, Bld 117 (*)    Calcium 8.7 (*)    All other components within normal limits  ACETAMINOPHEN LEVEL - Abnormal; Notable for the following components:   Acetaminophen (Tylenol), Serum <10 (*)    All other components within normal limits  ETHANOL  SALICYLATE LEVEL  CBC  RAPID URINE DRUG SCREEN, HOSP PERFORMED    EKG None  Radiology No results found.  Procedures Procedures (including critical care time)  Medications Ordered in ED Medications  amLODipine (NORVASC) tablet 10 mg (has no administration in time range)  hydrOXYzine (ATARAX/VISTARIL) tablet 25 mg (has no administration in time range)  nicotine (NICODERM CQ - dosed in mg/24 hours) patch 21 mg (has no administration in time range)  OLANZapine (ZYPREXA) tablet 2.5 mg (has no administration in time  range)  busPIRone (BUSPAR) tablet 5 mg (has no administration in time range)     Initial Impression / Assessment and Plan / ED Course  I have reviewed the triage vital signs and the nursing notes.  Pertinent labs  results that were available during my care of the patient were reviewed by me and considered in my medical decision making (see chart for details).        4:50 AM Patient with well-established history of presentations to the emergency department for drug intoxication or suicidal ideation.  Tonight he reports suicidal thoughts.  He is sleeping on initial exam but is easily arousable.  He is not very cooperative otherwise. Patient currently medically stable, home meds have been ordered, will consult psych  Final Clinical Impressions(s) / ED Diagnoses   Final diagnoses:  Suicidal ideation    ED Discharge Orders    None       Zadie Rhine, MD 04/24/18 0451

## 2018-04-24 NOTE — ED Notes (Signed)
Refused vitals 

## 2018-06-28 ENCOUNTER — Telehealth (HOSPITAL_COMMUNITY): Payer: Self-pay | Admitting: Psychiatry

## 2018-06-28 NOTE — Telephone Encounter (Signed)
D:  Pt phoned inquiring about a IOP group to transition to.  He is currently a patient at [facility] d/t Bipolar D/O and Cocaine.  States he's been there for 36 days so far and will be discharged on 07-08-18.  A:  Writer informed pt about CD-IOP.  Transferred pt to [employer], vm.  R:  Pt receptive.

## 2018-06-29 ENCOUNTER — Telehealth (HOSPITAL_COMMUNITY): Payer: Self-pay | Admitting: Licensed Clinical Social Worker

## 2018-06-29 NOTE — Telephone Encounter (Signed)
PT called to inquire about starting [facility] CD-IOP group upon his successful D/C from residential treatment on 07/07/18. PT schedule an appointment w/ Charmian Muff for June 1, Monday at [facility]. PT was informed to arrive early to fill out paperwork.

## 2018-07-08 ENCOUNTER — Telehealth (HOSPITAL_COMMUNITY): Payer: Self-pay | Admitting: Psychology

## 2018-07-11 ENCOUNTER — Other Ambulatory Visit: Payer: Self-pay

## 2018-07-11 ENCOUNTER — Ambulatory Visit (HOSPITAL_COMMUNITY): Payer: Medicare Other | Admitting: Psychology

## 2018-07-11 ENCOUNTER — Encounter (HOSPITAL_COMMUNITY): Payer: Self-pay | Admitting: Psychology

## 2018-07-11 DIAGNOSIS — F1420 Cocaine dependence, uncomplicated: Secondary | ICD-10-CM

## 2018-07-13 ENCOUNTER — Telehealth (HOSPITAL_COMMUNITY): Payer: Self-pay | Admitting: Psychology
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
## 2018-07-20 ENCOUNTER — Other Ambulatory Visit: Payer: Self-pay

## 2018-07-20 ENCOUNTER — Other Ambulatory Visit (HOSPITAL_COMMUNITY): Payer: Medicare Other | Attending: Psychiatry | Admitting: Licensed Clinical Social Worker

## 2018-07-20 ENCOUNTER — Encounter (HOSPITAL_COMMUNITY): Payer: Self-pay | Admitting: Psychology

## 2018-07-20 DIAGNOSIS — F1420 Cocaine dependence, uncomplicated: Secondary | ICD-10-CM

## 2018-07-20 NOTE — Progress Notes (Signed)
Timothy Mcmillan is a 63 y.o. male patient. CD-IOP: Discharge. The patient arrived at 12:30, as instructed, in order to complete a brief orientation before beginning the CD-IOP. He was resistant during the orientation, stating he didn't like to refer to himself as an addict or alcoholic. He took a phone call during the orientation. In the group session, the patient was disruptive and talked down to others group members. He displayed a passive aggressive attitude and seemed to seek out confrontation, disagreeing with whatever the group facilitator and lead counselor discussed. He also took a phone call and stepped out of the group room for almost ten minutes. After consulting with the medical director and counselor, it was determined that this was not a good fit and the patient would not continue in our program. This counselor spoke individually with the patient and explained our decision. He seemed indifferent, collected his belongings and left the office. Patient will be discharged "inappropriate for group counseling" and referred to a DBT counselor in the [place] area.         Brandon Melnick, LCAS

## 2018-07-21 ENCOUNTER — Encounter (HOSPITAL_COMMUNITY): Payer: Medicare Other

## 2018-07-21 ENCOUNTER — Telehealth (HOSPITAL_COMMUNITY): Payer: Self-pay | Admitting: Psychology

## 2018-07-21 NOTE — Progress Notes (Signed)
07/20/2018  1pm Client checked in for [place] following orientation with A. Amalia Hailey and Jerel Shepherd. Client presented agitated and was disruptive of conversations in group. Client was not receptive of statements from clinician and group members. Client left group to take a phone call for several minutes. Client requested group end at 3pm. Client spoke with A Evans, LCAS to discuss other options for treatment due to level of disruption in group setting.  Micheline Chapman, LCSW, LCAS

## 2018-07-25 ENCOUNTER — Encounter (HOSPITAL_COMMUNITY): Payer: Medicare Other

## 2018-07-27 ENCOUNTER — Encounter (HOSPITAL_COMMUNITY): Payer: Medicare Other

## 2018-07-28 ENCOUNTER — Encounter (HOSPITAL_COMMUNITY): Payer: Medicare Other

## 2018-08-01 ENCOUNTER — Encounter (HOSPITAL_COMMUNITY): Payer: Medicare Other

## 2018-08-03 ENCOUNTER — Encounter (HOSPITAL_COMMUNITY): Payer: Medicare Other

## 2018-08-04 ENCOUNTER — Encounter (HOSPITAL_COMMUNITY): Payer: Medicare Other

## 2018-08-08 ENCOUNTER — Encounter (HOSPITAL_COMMUNITY): Payer: Medicare Other

## 2018-08-10 ENCOUNTER — Encounter (HOSPITAL_COMMUNITY): Payer: Medicare Other

## 2018-08-11 ENCOUNTER — Encounter (HOSPITAL_COMMUNITY): Payer: Medicare Other

## 2018-08-15 ENCOUNTER — Encounter (HOSPITAL_COMMUNITY): Payer: Medicare Other

## 2018-08-17 ENCOUNTER — Encounter (HOSPITAL_COMMUNITY): Payer: Medicare Other

## 2018-08-18 ENCOUNTER — Encounter (HOSPITAL_COMMUNITY): Payer: Medicare Other

## 2018-08-22 ENCOUNTER — Encounter (HOSPITAL_COMMUNITY): Payer: Medicare Other

## 2019-09-26 ENCOUNTER — Ambulatory Visit (HOSPITAL_COMMUNITY)
Admission: EM | Admit: 2019-09-26 | Discharge: 2019-09-26 | Disposition: A | Discharge status: Home / Self Care | Payer: Medicare Other | Attending: Registered Nurse | Admitting: Registered Nurse

## 2019-09-26 ENCOUNTER — Other Ambulatory Visit: Payer: Self-pay

## 2019-09-26 ENCOUNTER — Encounter (HOSPITAL_COMMUNITY): Payer: Self-pay | Admitting: Registered Nurse

## 2019-09-26 DIAGNOSIS — F1490 Cocaine use, unspecified, uncomplicated: Secondary | ICD-10-CM | POA: Diagnosis not present

## 2019-09-26 DIAGNOSIS — F1421 Cocaine dependence, in remission: Secondary | ICD-10-CM | POA: Diagnosis present

## 2019-09-26 DIAGNOSIS — Z1330 Encounter for screening examination for mental health and behavioral disorders, unspecified: Secondary | ICD-10-CM | POA: Diagnosis not present

## 2019-09-26 DIAGNOSIS — Z87898 Personal history of other specified conditions: Secondary | ICD-10-CM

## 2019-09-26 DIAGNOSIS — F332 Major depressive disorder, recurrent severe without psychotic features: Secondary | ICD-10-CM

## 2019-09-26 DIAGNOSIS — F1420 Cocaine dependence, uncomplicated: Secondary | ICD-10-CM | POA: Diagnosis not present

## 2019-09-26 DIAGNOSIS — F331 Major depressive disorder, recurrent, moderate: Secondary | ICD-10-CM

## 2019-09-26 DIAGNOSIS — Z915 Personal history of self-harm: Secondary | ICD-10-CM | POA: Diagnosis not present

## 2019-09-26 DIAGNOSIS — F339 Major depressive disorder, recurrent, unspecified: Secondary | ICD-10-CM | POA: Diagnosis present

## 2019-09-26 NOTE — BH Assessment (Signed)
Comprehensive Clinical Assessment (CCA) Screening, Triage and Referral Note  09/26/2019 Timothy Mcmillan [id number]  Patient is a 64 y.o. male with a history of Major Depressive Disorder, recurrent, moderate and Cocaine Use Disorder who presents reporting worsening depression that seems treatment resistant.  He is prescribed Wellbutrin and would like to continue, as it seems to be more effective than other medications he has tried.  He is hoping to engage in an IOP program for group therapy.  He is also requesting referral to a psychologist, as he would like to "move past therapy to deeper things."  Patient reports past history of inpatient treatment, with most recent [facility] admission 2-3 yrs ago. He denies current SI, HI and AVH.  He denies any other stressors and is requesting referrals for psychologists and IOP programs.  Disposition: Per Assunta Found, NP patient is psychiatrically cleared for discharge.  He has been provided with referral information for psychologists in the [place] area and IOP programs.   Visit Diagnosis: No diagnosis found.  Patient Reported Information How did you hear about [place]? No data recorded  Referral name: No data recorded  Referral phone number: No data recorded Whom do you see for routine medical problems? No data recorded  Practice/Facility Name: No data recorded  Practice/Facility Phone Number: No data recorded  Name of Contact: No data recorded  Contact Number: No data recorded  Contact Fax Number: No data recorded  Prescriber Name: No data recorded  Prescriber Address (if known): No data recorded What Is the Reason for Your Visit/Call Today? No data recorded How Long Has This Been Causing You Problems? No data recorded Have You Recently Been in Any Inpatient Treatment (Hospital/Detox/Crisis Center/28-Day Program)? No   Name/Location of Program/Hospital:No data recorded  How Long Were You There? No data recorded  When Were You Discharged?  No data recorded Have You Ever Received Services From [facility] Before? Yes   Who Do You See at [facility]? Past inpatient admission  Have You Recently Had Any Thoughts About Hurting Yourself? No   Are You Planning to Commit Suicide/Harm Yourself At This time?  No  Have you Recently Had Thoughts About Hurting Someone Timothy Mcmillan? No   Explanation: No data recorded Have You Used Any Alcohol or Drugs in the Past 24 Hours? No   How Long Ago Did You Use Drugs or Alcohol?  No data recorded  What Did You Use and How Much? No data recorded What Do You Feel Would Help You the Most Today? No data recorded Do You Currently Have a Therapist/Psychiatrist? Yes   Name of Therapist/Psychiatrist: Cyndi Lennert with [facility] - Therapist   Have You Been Recently Discharged From Any Office Practice or Programs? No   Explanation of Discharge From Practice/Program:  No data recorded    CCA Screening Triage Referral Assessment Type of Contact: Face-to-Face   Is this Initial or Reassessment? No data recorded  Date Telepsych consult ordered in [facility]:  No data recorded  Time Telepsych consult ordered in [facility]:  No data recorded Patient Reported Information Reviewed? Yes   Patient Left Without Being Seen? No data recorded  Reason for Not Completing Assessment: No data recorded Collateral Involvement: N/A  Does Patient Have a Court Appointed Legal Guardian? No data recorded  Name and Contact of Legal Guardian:  No data recorded If Minor and Not Living with Parent(s), Who has Custody? No data recorded Is CPS involved or ever been involved? Never  Is APS involved or ever been involved?  Never  Patient Determined To Be At Risk for Harm To Self or Others Based on Review of Patient Reported Information or Presenting Complaint? No   Method: No data recorded  Availability of Means: No data recorded  Intent: No data recorded  Notification Required: No data recorded  Additional Information for Danger to  Others Potential:  No data recorded  Additional Comments for Danger to Others Potential:  No data recorded  Are There Guns or Other Weapons in Your Home?  No data recorded   Types of Guns/Weapons: No data recorded   Are These Weapons Safely Secured?                              No data recorded   Who Could Verify You Are Able To Have These Secured:    No data recorded Do You Have any Outstanding Charges, Pending Court Dates, Parole/Probation? No data recorded Contacted To Inform of Risk of Harm To Self or Others: No data recorded Location of Assessment: [facility]  Does Patient Present under Involuntary Commitment? No   IVC Papers Initial File Date: No data recorded  [place] of Residence: [place]  Patient Currently Receiving the Following Services: Individual Therapy   Determination of Need: Routine (7 days)   Options For Referral: Medication Management   Yetta Glassman

## 2019-09-26 NOTE — Discharge Instructions (Addendum)
You are encouraged to contact [facility] to for IOP programs: They offer Intensive Outpatient Program (IOP) and Chemical Dependency Intensive Outpatient Program (CD-IOP) offered virtually.  Please call [phone] and ask for Timothy Mcmillan.  Psychologists in [place]:  You will need to contact clinics for information on insurance they accept.  [facility] Located in: [facility] Address: [address], [place], [place] [address] Phone: [phone]  Dr. Vedia Pereyra, PhD Address: [address], [place], [place] [address] Phone: [phone]  [facility] at [facility] in: [facility] Address: [address] [place], [place] [address] Phone: [phone]  [facility] Address: [address] [place] [address] [place]ella Raring [place], [place] [address] Phone: [phone]

## 2019-09-26 NOTE — ED Provider Notes (Signed)
Behavioral Health Medical Screening Exam  Timothy Mcmillan is a 64 y.o. male patient presents as a walk in at [facility] with complaints of depression and recent relapse on cocaine.  Patient states that he wants to get involved in a group and to see a psychologist.   Patient states that he lives alone but has supportive friends.  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  Patient does have a history of psychiatric hospitalization and prior suicide attempt  During evaluation Timothy Mcmillan is alert/oriented x 4; calm/cooperative; and mood is congruent with affect.  He does not appear to be responding to internal/external stimuli or delusional thoughts.  Patient denies suicidal/self-harm/homicidal ideation, psychosis, and paranoia.  Patient answered question appropriately.           Total Time spent with patient: 30 minutes  Psychiatric Specialty Exam  Presentation  General Appearance:Appropriate for Environment;Casual  Eye Contact:Good  Speech:Clear and Coherent;Normal Rate  Speech Volume:Normal  Handedness:Right   Mood and Affect  Mood:Depressed (Stable)  Affect:Appropriate;Congruent   Thought Process  Thought Processes:Coherent;Goal Directed  Descriptions of Associations:Intact  Orientation:Full (Time, Place and Person)  Thought Content:No data recorded Hallucinations:None  Ideas of Reference:None  Suicidal Thoughts:No  Homicidal Thoughts:No   Sensorium  Memory:Immediate Good;Recent Good;Remote Good  Judgment:Intact  Insight:Good;Present   Executive Functions  Concentration:Good  Attention Span:Good  Recall:Good  Fund of Knowledge:Good  Language:Good   Psychomotor Activity  Psychomotor Activity:Normal   Assets  Assets:Communication Skills;Desire for Improvement;Financial Resources/Insurance;Housing;Social Support   Sleep  Sleep:Good  Number of hours: No data recorded  Physical Exam: Physical Exam Vitals and nursing note  reviewed.  Constitutional:      Appearance: Normal appearance.  HENT:     Head: Normocephalic.  Pulmonary:     Effort: Pulmonary effort is normal.  Musculoskeletal:        General: Normal range of motion.  Skin:    General: Skin is warm and dry.  Neurological:     Mental Status: He is alert.  Psychiatric:        Attention and Perception: Attention and perception normal.        Mood and Affect: Mood and affect normal.        Speech: Speech normal.        Behavior: Behavior normal. Behavior is cooperative.        Thought Content: Thought content normal.        Cognition and Memory: Cognition and memory normal.        Judgment: Judgment normal.    Review of Systems  Psychiatric/Behavioral: Negative for memory loss. Depression: Stable. Hallucinations: Denies. Substance abuse: Recent relapse on cocaine. Suicidal ideas: Denies. The patient does not have insomnia. Nervous/anxious: Stabe.   All other systems reviewed and are negative.  Blood pressure 140/82, pulse 66, temperature 97.7 F (36.5 C), temperature source Oral, resp. rate 18, SpO2 100 %. There is no height or weight on file to calculate BMI.  Musculoskeletal: Strength & Muscle Tone: within normal limits Gait & Station: normal Patient leans: N/A   Recommendations:  Outpatient psychiatric resources given  Based on my evaluation the patient does not appear to have an emergency medical condition.  Caffie Sotto, NP 09/26/2019, 4:49 PM
